# Patient Record
Sex: Male | Born: 1950 | Race: White | Hispanic: No | Marital: Married | Smoking: Former smoker
Health system: Southern US, Community
[De-identification: ages and names within clinical notes are randomized; demographics above are authoritative.]

## PROBLEM LIST (undated history)

## (undated) DIAGNOSIS — I340 Nonrheumatic mitral (valve) insufficiency: Secondary | ICD-10-CM

## (undated) DIAGNOSIS — I2510 Atherosclerotic heart disease of native coronary artery without angina pectoris: Secondary | ICD-10-CM

## (undated) DIAGNOSIS — I10 Essential (primary) hypertension: Secondary | ICD-10-CM

## (undated) DIAGNOSIS — E785 Hyperlipidemia, unspecified: Secondary | ICD-10-CM

## (undated) DIAGNOSIS — N529 Male erectile dysfunction, unspecified: Secondary | ICD-10-CM

## (undated) DIAGNOSIS — E119 Type 2 diabetes mellitus without complications: Secondary | ICD-10-CM

## (undated) DIAGNOSIS — G4730 Sleep apnea, unspecified: Secondary | ICD-10-CM

## (undated) HISTORY — PX: COLONOSCOPY: SHX174

## (undated) HISTORY — DX: Type 2 diabetes mellitus without complications: E11.9

## (undated) HISTORY — DX: Essential (primary) hypertension: I10

## (undated) HISTORY — DX: Hyperlipidemia, unspecified: E78.5

## (undated) HISTORY — DX: Atherosclerotic heart disease of native coronary artery without angina pectoris: I25.10

## (undated) HISTORY — DX: Male erectile dysfunction, unspecified: N52.9

## (undated) HISTORY — PX: WRIST SURGERY: SHX841

---

## 1998-09-05 ENCOUNTER — Emergency Department (HOSPITAL_COMMUNITY): Admission: EM | Admit: 1998-09-05 | Discharge: 1998-09-05 | Payer: Self-pay | Admitting: Emergency Medicine

## 1998-09-05 ENCOUNTER — Encounter: Payer: Self-pay | Admitting: Emergency Medicine

## 1998-09-07 ENCOUNTER — Encounter: Payer: Self-pay | Admitting: Orthopedic Surgery

## 1998-09-07 ENCOUNTER — Ambulatory Visit (HOSPITAL_COMMUNITY): Admission: RE | Admit: 1998-09-07 | Discharge: 1998-09-07 | Payer: Self-pay | Admitting: Orthopedic Surgery

## 1998-10-28 ENCOUNTER — Encounter: Payer: Self-pay | Admitting: Orthopedic Surgery

## 1998-10-28 ENCOUNTER — Observation Stay (HOSPITAL_COMMUNITY): Admission: RE | Admit: 1998-10-28 | Discharge: 1998-10-29 | Payer: Self-pay | Admitting: Orthopedic Surgery

## 1998-10-28 ENCOUNTER — Encounter (INDEPENDENT_AMBULATORY_CARE_PROVIDER_SITE_OTHER): Payer: Self-pay | Admitting: Specialist

## 2001-08-16 ENCOUNTER — Encounter: Admission: RE | Admit: 2001-08-16 | Discharge: 2001-08-16 | Payer: Self-pay | Admitting: Family Medicine

## 2001-08-16 ENCOUNTER — Encounter: Payer: Self-pay | Admitting: Family Medicine

## 2001-10-25 ENCOUNTER — Ambulatory Visit (HOSPITAL_COMMUNITY): Admission: RE | Admit: 2001-10-25 | Discharge: 2001-10-25 | Payer: Self-pay | Admitting: *Deleted

## 2002-03-24 ENCOUNTER — Encounter: Payer: Self-pay | Admitting: Internal Medicine

## 2002-03-24 ENCOUNTER — Encounter: Admission: RE | Admit: 2002-03-24 | Discharge: 2002-03-24 | Payer: Self-pay | Admitting: Internal Medicine

## 2003-11-13 ENCOUNTER — Ambulatory Visit: Payer: Self-pay | Admitting: Internal Medicine

## 2003-11-23 ENCOUNTER — Encounter: Admission: RE | Admit: 2003-11-23 | Discharge: 2003-11-23 | Payer: Self-pay | Admitting: Internal Medicine

## 2004-04-18 ENCOUNTER — Ambulatory Visit: Payer: Self-pay | Admitting: Internal Medicine

## 2005-03-13 ENCOUNTER — Ambulatory Visit: Payer: Self-pay | Admitting: Internal Medicine

## 2005-03-17 ENCOUNTER — Ambulatory Visit: Payer: Self-pay | Admitting: Internal Medicine

## 2006-01-03 ENCOUNTER — Ambulatory Visit: Payer: Self-pay | Admitting: Internal Medicine

## 2006-01-03 LAB — CONVERTED CEMR LAB
Folate: 6.1 ng/mL
Sed Rate: 17 mm/hr (ref 0–20)

## 2006-01-16 ENCOUNTER — Ambulatory Visit: Payer: Self-pay | Admitting: Internal Medicine

## 2006-02-28 ENCOUNTER — Ambulatory Visit: Payer: Self-pay | Admitting: Internal Medicine

## 2006-03-29 ENCOUNTER — Ambulatory Visit: Payer: Self-pay | Admitting: Internal Medicine

## 2006-05-09 DIAGNOSIS — F528 Other sexual dysfunction not due to a substance or known physiological condition: Secondary | ICD-10-CM

## 2006-05-09 DIAGNOSIS — E785 Hyperlipidemia, unspecified: Secondary | ICD-10-CM | POA: Insufficient documentation

## 2006-05-09 DIAGNOSIS — S62109A Fracture of unspecified carpal bone, unspecified wrist, initial encounter for closed fracture: Secondary | ICD-10-CM | POA: Insufficient documentation

## 2006-05-11 ENCOUNTER — Ambulatory Visit: Payer: Self-pay | Admitting: Internal Medicine

## 2006-09-06 ENCOUNTER — Encounter: Payer: Self-pay | Admitting: Emergency Medicine

## 2006-09-06 ENCOUNTER — Inpatient Hospital Stay (HOSPITAL_COMMUNITY): Admission: AD | Admit: 2006-09-06 | Discharge: 2006-09-08 | Payer: Self-pay | Admitting: Internal Medicine

## 2006-09-06 ENCOUNTER — Telehealth (INDEPENDENT_AMBULATORY_CARE_PROVIDER_SITE_OTHER): Payer: Self-pay | Admitting: *Deleted

## 2006-09-06 ENCOUNTER — Ambulatory Visit: Payer: Self-pay | Admitting: Internal Medicine

## 2006-09-07 ENCOUNTER — Encounter: Payer: Self-pay | Admitting: Internal Medicine

## 2006-09-27 ENCOUNTER — Ambulatory Visit: Payer: Self-pay | Admitting: Internal Medicine

## 2006-10-12 ENCOUNTER — Ambulatory Visit: Payer: Self-pay | Admitting: Cardiology

## 2006-11-08 ENCOUNTER — Ambulatory Visit: Payer: Self-pay | Admitting: Internal Medicine

## 2006-11-19 ENCOUNTER — Encounter: Payer: Self-pay | Admitting: Internal Medicine

## 2006-12-26 ENCOUNTER — Ambulatory Visit: Payer: Self-pay | Admitting: Cardiology

## 2007-01-07 ENCOUNTER — Ambulatory Visit: Payer: Self-pay | Admitting: Internal Medicine

## 2007-01-07 DIAGNOSIS — I10 Essential (primary) hypertension: Secondary | ICD-10-CM

## 2007-01-07 DIAGNOSIS — I2510 Atherosclerotic heart disease of native coronary artery without angina pectoris: Secondary | ICD-10-CM

## 2007-01-07 DIAGNOSIS — Z9861 Coronary angioplasty status: Secondary | ICD-10-CM

## 2007-01-14 ENCOUNTER — Telehealth (INDEPENDENT_AMBULATORY_CARE_PROVIDER_SITE_OTHER): Payer: Self-pay | Admitting: *Deleted

## 2007-02-14 ENCOUNTER — Ambulatory Visit: Payer: Self-pay | Admitting: Internal Medicine

## 2007-03-05 ENCOUNTER — Ambulatory Visit: Payer: Self-pay | Admitting: Internal Medicine

## 2007-03-08 LAB — CONVERTED CEMR LAB
ALT: 46 units/L (ref 0–53)
BUN: 10 mg/dL (ref 6–23)
Basophils Absolute: 0 10*3/uL (ref 0.0–0.1)
CO2: 28 meq/L (ref 19–32)
Calcium: 9.5 mg/dL (ref 8.4–10.5)
Eosinophils Relative: 2.1 % (ref 0.0–5.0)
Folate: 13.7 ng/mL
GFR calc Af Amer: 68 mL/min
GFR calc non Af Amer: 56 mL/min
HCT: 39.9 % (ref 39.0–52.0)
MCHC: 32.2 g/dL (ref 30.0–36.0)
Neutrophils Relative %: 59.9 % (ref 43.0–77.0)
PSA: 0.82 ng/mL (ref 0.10–4.00)
Platelets: 274 10*3/uL (ref 150–400)
RBC: 4.32 M/uL (ref 4.22–5.81)
RDW: 14.1 % (ref 11.5–14.6)
TSH: 1.02 microintl units/mL (ref 0.35–5.50)
VLDL: 12 mg/dL (ref 0–40)
WBC: 8.9 10*3/uL (ref 4.5–10.5)

## 2007-03-26 ENCOUNTER — Telehealth: Payer: Self-pay | Admitting: Internal Medicine

## 2007-05-07 ENCOUNTER — Ambulatory Visit: Payer: Self-pay | Admitting: Cardiology

## 2007-08-29 ENCOUNTER — Ambulatory Visit: Payer: Self-pay | Admitting: Cardiology

## 2007-08-29 ENCOUNTER — Ambulatory Visit: Payer: Self-pay | Admitting: Internal Medicine

## 2007-08-29 LAB — CONVERTED CEMR LAB
Basophils Absolute: 0.1 10*3/uL (ref 0.0–0.1)
CO2: 28 meq/L (ref 19–32)
Chloride: 106 meq/L (ref 96–112)
Eosinophils Absolute: 0.3 10*3/uL (ref 0.0–0.7)
GFR calc non Af Amer: 67 mL/min
Lymphocytes Relative: 32.1 % (ref 12.0–46.0)
MCHC: 35.2 g/dL (ref 30.0–36.0)
MCV: 92.2 fL (ref 78.0–100.0)
Neutrophils Relative %: 55.4 % (ref 43.0–77.0)
Platelets: 230 10*3/uL (ref 150–400)
Potassium: 4 meq/L (ref 3.5–5.1)
Prothrombin Time: 12 s (ref 10.9–13.3)
RBC: 4.33 M/uL (ref 4.22–5.81)
RDW: 13.5 % (ref 11.5–14.6)
Sodium: 139 meq/L (ref 135–145)
aPTT: 30.8 s — ABNORMAL HIGH (ref 21.7–29.8)

## 2007-09-03 ENCOUNTER — Ambulatory Visit: Payer: Self-pay | Admitting: Cardiology

## 2007-09-03 ENCOUNTER — Inpatient Hospital Stay (HOSPITAL_BASED_OUTPATIENT_CLINIC_OR_DEPARTMENT_OTHER): Admission: RE | Admit: 2007-09-03 | Discharge: 2007-09-03 | Payer: Self-pay | Admitting: Cardiovascular Disease

## 2007-09-18 ENCOUNTER — Ambulatory Visit: Payer: Self-pay

## 2007-09-18 ENCOUNTER — Encounter: Payer: Self-pay | Admitting: Internal Medicine

## 2007-09-18 ENCOUNTER — Ambulatory Visit: Payer: Self-pay | Admitting: Internal Medicine

## 2007-09-25 ENCOUNTER — Ambulatory Visit: Payer: Self-pay

## 2007-09-25 ENCOUNTER — Ambulatory Visit: Payer: Self-pay | Admitting: Cardiovascular Disease

## 2007-11-11 ENCOUNTER — Ambulatory Visit: Payer: Self-pay | Admitting: Internal Medicine

## 2008-03-04 ENCOUNTER — Ambulatory Visit: Payer: Self-pay | Admitting: Cardiology

## 2008-05-18 ENCOUNTER — Ambulatory Visit: Payer: Self-pay | Admitting: Internal Medicine

## 2008-06-29 ENCOUNTER — Ambulatory Visit: Payer: Self-pay

## 2008-06-29 ENCOUNTER — Encounter: Payer: Self-pay | Admitting: Internal Medicine

## 2008-07-06 ENCOUNTER — Telehealth: Payer: Self-pay | Admitting: Internal Medicine

## 2008-09-01 ENCOUNTER — Ambulatory Visit: Payer: Self-pay | Admitting: Cardiology

## 2008-09-01 ENCOUNTER — Encounter: Payer: Self-pay | Admitting: Internal Medicine

## 2008-09-23 ENCOUNTER — Telehealth (INDEPENDENT_AMBULATORY_CARE_PROVIDER_SITE_OTHER): Payer: Self-pay | Admitting: *Deleted

## 2008-09-24 ENCOUNTER — Encounter: Payer: Self-pay | Admitting: Internal Medicine

## 2008-09-25 ENCOUNTER — Encounter (INDEPENDENT_AMBULATORY_CARE_PROVIDER_SITE_OTHER): Payer: Self-pay | Admitting: *Deleted

## 2008-10-14 ENCOUNTER — Encounter (INDEPENDENT_AMBULATORY_CARE_PROVIDER_SITE_OTHER): Payer: Self-pay | Admitting: *Deleted

## 2008-11-25 ENCOUNTER — Ambulatory Visit: Payer: Self-pay | Admitting: Internal Medicine

## 2008-11-25 DIAGNOSIS — N400 Enlarged prostate without lower urinary tract symptoms: Secondary | ICD-10-CM

## 2008-11-25 DIAGNOSIS — R7309 Other abnormal glucose: Secondary | ICD-10-CM

## 2008-11-26 LAB — CONVERTED CEMR LAB: Hgb A1c MFr Bld: 6.1 % (ref 4.6–6.5)

## 2008-11-27 ENCOUNTER — Encounter (INDEPENDENT_AMBULATORY_CARE_PROVIDER_SITE_OTHER): Payer: Self-pay | Admitting: *Deleted

## 2008-12-11 ENCOUNTER — Encounter (INDEPENDENT_AMBULATORY_CARE_PROVIDER_SITE_OTHER): Payer: Self-pay | Admitting: *Deleted

## 2009-01-06 ENCOUNTER — Telehealth: Payer: Self-pay | Admitting: Internal Medicine

## 2009-02-11 ENCOUNTER — Encounter: Payer: Self-pay | Admitting: Internal Medicine

## 2009-02-12 ENCOUNTER — Ambulatory Visit: Payer: Self-pay | Admitting: Internal Medicine

## 2009-04-06 ENCOUNTER — Encounter (INDEPENDENT_AMBULATORY_CARE_PROVIDER_SITE_OTHER): Payer: Self-pay | Admitting: *Deleted

## 2009-04-19 ENCOUNTER — Telehealth (INDEPENDENT_AMBULATORY_CARE_PROVIDER_SITE_OTHER): Payer: Self-pay | Admitting: *Deleted

## 2009-06-14 ENCOUNTER — Ambulatory Visit: Payer: Self-pay | Admitting: Internal Medicine

## 2009-06-14 DIAGNOSIS — M549 Dorsalgia, unspecified: Secondary | ICD-10-CM | POA: Insufficient documentation

## 2009-06-14 LAB — CONVERTED CEMR LAB
Bilirubin Urine: NEGATIVE
Glucose, Urine, Semiquant: NEGATIVE
Protein, U semiquant: NEGATIVE
Specific Gravity, Urine: <1.005
WBC Urine, dipstick: NEGATIVE
pH: 5

## 2009-11-22 ENCOUNTER — Telehealth: Payer: Self-pay | Admitting: Internal Medicine

## 2010-01-09 HISTORY — PX: COLONOSCOPY: SHX174

## 2010-02-08 NOTE — Progress Notes (Signed)
Summary: pt needs refill  Phone Note Refill Request Message from:  Patient on [facility]  Refills Requested: Medication #1:  METOPROLOL SUCCINATE 100 MG  TB24 1 by mouth qd  Medication #2:  PLAVIX 75 MG  TABS qd Initial call taken by: Omer Jack,  April 19, 2009 9:58 AM  Follow-up for Phone Call        Rx faxed to pharmacy [facility] x3 refills on Plavix and Metoprolol Follow-up by: Oswald Hillock,  April 19, 2009 10:50 AM    Prescriptions: METOPROLOL SUCCINATE 100 MG  TB24 (METOPROLOL SUCCINATE) 1 by mouth qd  #90 x 3   Entered by:   Oswald Hillock   Authorized by:   Sherrill Raring, MD, [facility]   Signed by:   Oswald Hillock on 04/19/2009   Method used:   Faxed to ...       [facility] (mail-order)             ,          Ph: [phone]       Fax: [phone]   RxID:   [id number] PLAVIX 75 MG  TABS (CLOPIDOGREL BISULFATE) qd  #90 x 3   Entered by:   Oswald Hillock   Authorized by:   Sherrill Raring, MD, [facility]   Signed by:   Oswald Hillock on 04/19/2009   Method used:   Faxed to ...       [facility] (mail-order)             ,          Ph: [phone]       Fax: [phone]   RxID:   [id number]

## 2010-02-08 NOTE — Miscellaneous (Signed)
  Clinical Lists Changes  Observations: Added new observation of RS STUDY: TRACER- study completion 02/12/09 (04/06/2009 11:12)      Research Study Name: TRACER- study completion 02/12/09

## 2010-02-08 NOTE — Assessment & Plan Note (Signed)
Summary: ROV   Visit Type:  Follow-up Primary Provider:  Nolon Rod. Paz MD  CC:  no complaints.  History of Present Illness: Patient is a 60 year old with a history of CAD (s/p NSTEMI in Summer of 2008.  PTCA/Stent to RCA.  REpeat cath in August 2009:  LAD  20 to 30% prox; 40 to 50% mid; RCA with 20% instent restenosis.  Myoview after showed normal perfusion.  Echo was normal)  I last saw him in May of last year. Since seen, she has done well.  Denies chest pains, no shortness of breath.  Current Medications (verified): 1)  Plavix 75 Mg  Tabs (Clopidogrel Bisulfate) .... Qd 2)  Lipitor 40 Mg  Tabs (Atorvastatin Calcium) .... Qd 3)  Metoprolol Succinate 100 Mg  Tb24 (Metoprolol Succinate) .Marland Kitchen.. 1 By Mouth Qd 4)  Viagra 100 Mg  Tabs (Sildenafil Citrate) .... 1/2  To 1 By Mouth Once Daily As Needed 5)  Nitroglycerin 0.4 Mg/hr Pt24 (Nitroglycerin) .... 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  Plans to increase walking  Review of Systems       All systems reviewed.  Neg to above problem  Vital Signs:  Patient profile:   60 year old male Height:      71 inches Weight:      245 pounds BMI:     34.29 Pulse rate:   67 / minute BP sitting:   122 / 82  (left  arm) Cuff size:   large  Vitals Entered By: Burnett Kanaris, CNA (February 12, 2009 11:15 AM)  Physical Exam  Additional Exam:  HEENT:  Normocephalic, atraumatic. EOMI, PERRLA.  Neck: JVP is normal. No thyromegaly. No bruits.  Lungs: clear to auscultation. No rales no wheezes.  Heart: Regular rate and rhythm. Normal S1, S2. No S3.   No significant murmurs. PMI not displaced.  Abdomen:  Obese  Supple, nontender. Normal bowel sounds. No definite masses. No hepatomegaly.  Extremities:   Good distal pulses throughout. No lower extremity edema.  Musculoskeletal :moving all extremities.  Neuro:   alert and oriented x3.    EKG  Procedure date:  02/12/2009  Findings:      NSR.  63.  LVH  Impression & Recommendations:  Problem # 1:  CORONARY ARTERY DISEASE (ICD-414.00) APpears stable.  Since stent was put in at time of NSTEMI and with 20% restensosis on last cath would continue Plavix and ASA. His updated medication list for this problem includes:    Plavix 75 Mg Tabs (Clopidogrel bisulfate) ..... Qd    Metoprolol Succinate 100 Mg Tb24 (Metoprolol succinate) .Marland Kitchen... 1 by mouth qd    Nitroglycerin 0.4 Mg/hr Pt24 (Nitroglycerin) .Marland Kitchen... Apply 1 patch each morning and remove at bedtime    Aspir-low 81 Mg Tbec (Aspirin) .Marland Kitchen... 1 tablet every day  Problem # 2:  HYPERLIPIDEMIA (ICD-272.4) Lipids checked today. His updated medication list for this  problem includes:    Lipitor 40 Mg Tabs (Atorvastatin calcium) ..... Qd  Problem # 3:  HYPERTENSION (ICD-401.9) Good control. His updated medication list for this problem includes:    Metoprolol Succinate 100 Mg Tb24 (Metoprolol succinate) .Marland Kitchen... 1 by mouth qd    Aspir-low 81 Mg Tbec (Aspirin) .Marland Kitchen... 1 tablet every day  Problem # 4:  PHYSICAL EXAMINATION (ICD-V70.0) Counselled on wt and increasing exercise.  Patient Instructions: 1)  continue meds.  Increase exercise.  F/U 1 year unless develop problems.

## 2010-02-08 NOTE — Miscellaneous (Signed)
  Clinical Lists Changes  Observations: Added new observation of ECHOINTERP:  1. Left ventricle: The cavity size was normal. Wall thickness was        normal. Systolic function was normal. The estimated ejection        fraction was in the range of 60% to 65%.     2. Left atrium: The atrium was mildly dilated.        (06/29/2008 15:16)      Echocardiogram  Procedure date:  06/29/2008  Findings:       1. Left ventricle: The cavity size was normal. Wall thickness was        normal. Systolic function was normal. The estimated ejection        fraction was in the range of 60% to 65%.     2. Left atrium: The atrium was mildly dilated.

## 2010-02-08 NOTE — Progress Notes (Signed)
Summary: Viagra refill  Phone Note Refill Request Message from:  Fax from Pharmacy on November 22, 2009 12:04 PM  Refills Requested: Medication #1:  VIAGRA 100 MG  TABS 1/2  to 1 by mouth once daily as needed [facility] pharmacy   fax [phone]    NOTE--per fax-patient has requested a 90 day supply of medication (as appropriate) from the [employer] states 90 day supply with 4 refills  Initial call taken by: Jerolyn Shin,  November 22, 2009 12:07 PM  Follow-up for Phone Call        unclear how many tablets is  "90 day supply" per his insurance policy. Let's try #30 and 3 refills Follow-up by: Jose E. Paz MD,  November 22, 2009 5:09 PM    Prescriptions: VIAGRA 100 MG  TABS (SILDENAFIL CITRATE) 1/2  to 1 by mouth once daily as needed  #30 x 3   Entered by:   Army Fossa CMA   Authorized by:   Nolon Rod. Paz MD   Signed by:   Army Fossa CMA on 11/23/2009   Method used:   Faxed to ...       [facility] (mail-[facility])             , [place]         Ph: [phone]       Fax: [phone]   RxID:   [id number]

## 2010-02-08 NOTE — Assessment & Plan Note (Signed)
Summary: LOWER BACK PAIN.CBS   Vital Signs:  Patient profile:   60 year old male Height:      71 inches Weight:      247 pounds Temp:     99.1 degrees F oral Pulse rate:   76 / minute BP sitting:   138 / 68  (left arm)  Vitals Entered By: Jeremy Johann CMA (June 14, 2009 3:52 PM) CC: pain in lower back x10day Comments REVIEWED MED LIST, PATIENT AGREED DOSE AND INSTRUCTION CORRECT    History of Present Illness: developed mild lower back pain 10 days ago, symptoms slightly worse for the last 3 days Last night he change a flat tire This morning the pain was much worse, some radiation to the left buttock. Pain exacerbated by standing up, not exacerbated by twisting his torso He is only taking aspirin and Motrin with mild help although  the pain is better this afternoon   ROS Denies fevers No bladder or bowel incontinence No lower extremity paresthesias No rash in the buttocks No hematuria or dysuria as far as his CAD, he was last seen by cardiology 2-11, stable, no changes suggested hyperglycemia, found to have a slightly elevated hemoglobin A1c a few months ago  Allergies: No Known Drug Allergies  Past History:  Past Medical History: HYPERLIPIDEMIA  HYPERTENSION   CORONARY ARTERY DISEASE S/P  stent Summer 2008 ERECTILE DYSFUNCTION (ICD-302.72) hyperglycemia, hemoglobin A1c 6.1  (11- 2010)  Past Surgical History: Reviewed history from 11/25/2008 and no changes required. 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   Abdomen:  soft, non-tender, and no distention.   Msk:  not tender to palpation of the lower back Extremities:  no edema Neurologic:  lower extremities: strength symmetric, DTRs symmetric (slightly decreased at the  ankles) Gait normal Posture no antalgic   Impression & Recommendations:  Problem # 1:  BACK PAIN (ICD-724.5) acute back pain without radicular features Conservative treatment see instructions If no better consider physical therapy and/or Vicodin His updated medication list for this problem includes:    Aspir-low 81 Mg Tbec (Aspirin) .Marland Kitchen... 1 tablet every day    Flexeril 10 Mg Tabs (Cyclobenzaprine hcl) ..... One by mouth twice a day as needed for pain  Problem # 2:  HYPERGLYCEMIA, FASTING (ICD-790.29) patient aware of the dx Recommend to come back for a followup in few weeks  Complete Medication List: 1)  Plavix 75 Mg Tabs (Clopidogrel bisulfate) .... Qd 2)  Lipitor 40 Mg Tabs (Atorvastatin calcium) .... Qd 3)  Metoprolol Succinate 100 Mg Tb24 (Metoprolol succinate) .Marland Kitchen.. 1 by mouth qd 4)  Viagra 100 Mg Tabs (Sildenafil citrate) .... 1/2  to 1 by mouth once daily as needed 5)  Nitroglycerin 0.4 Mg/hr Pt24 (Nitroglycerin) .... Apply 1 patch each morning and remove at bedtime 6)  Aspir-low 81 Mg Tbec (Aspirin) .Marland Kitchen.. 1 tablet every day 7)  Flexeril 10 Mg Tabs (Cyclobenzaprine hcl) .... One by mouth twice a day as needed for pain  Other Orders: UA Dipstick w/o Micro (manual) (02725)  Patient Instructions: 1)  rest, no heavy lifting 2)  Heating pad to the back 3)  For pain use Tylenol 500 mg one or 2 tablets every 6 hours as needed 4)  Avoid taking more than  4000 mg of Tylenol in a 24 hour period( can cause liver damage in higher doses).  5)  Flexeril, muscle relaxant, as needed. Will make her drowsy 6)  Call me if not better  in few days Prescriptions: FLEXERIL 10 MG TABS (CYCLOBENZAPRINE HCL) one by mouth twice a day as needed for pain  #20 x 0   Entered and Authorized by:   Nolon Rod. Jakeria Caissie MD   Signed by:   Nolon Rod. Manika Hast MD on 06/14/2009   Method used:   Print then Give to Patient   RxID:   [id number]   Laboratory Results   Urine Tests   Date/Time Reported:  .d  Routine Urinalysis   Color: yellow Appearance: Clear Glucose: negative   (Normal Range: Negative) Bilirubin: negative   (Normal Range: Negative) Ketone: negative   (Normal Range: Negative) Spec. Gravity: <1.005   (Normal Range: 1.003-1.035) Blood: negative   (Normal Range: Negative) pH: 5.0   (Normal Range: 5.0-8.0) Protein: negative   (Normal Range: Negative) Urobilinogen: 0.2   (Normal Range: 0-1) Nitrite: negative   (Normal Range: Negative) Leukocyte Esterace: negative   (Normal Range: Negative)

## 2010-05-24 NOTE — Assessment & Plan Note (Signed)
[facility]                            CARDIOLOGY OFFICE NOTE   NAME:[name], [name]                      MRN:          [id number]  DATE:08/29/2007                            DOB:          Jun 21, 1950    IDENTIFICATION:  [name] [name] is a 60 year old gentleman.  I last saw [name] [name]  in October 2008.  [name] [name] is status post a non-Q-wave MI, underwent PTCA/bare  metal stent to [name] proximal RCA.  [name] [name] comes in today for continued care.   [name] [name] said [name] [name] is actually been feeling worse particularly over [name]  past month, but even before.  [name] [name] has had increased shortness of breath.  [name] [name] has also had occasional chest pains initially, very short-lived.  [name] [name]  has had some pressure that is sometimes occurring in his back as well.  This actually improved some with activity.  [name] [name] has noted some numbness  though in his left arm at times.  Overall, though [name] [name] does note a decline  in his ability to do things, [name] [name] just gives out easily.  [name] [name] is concerned  that [name] [name] may be having problems again as [name] [name] did a year ago.   CURRENT MEDICINES:  1. Tracer study drug.  2. Metoprolol 100 daily.  3. Plavix 75.  4. Lipitor 40.   PHYSICAL EXAMINATION:  GENERAL:  [name] [name] is in no distress.  VITAL SIGNS:  Blood pressure is 140/84, pulse is 56 and regular.  Weight  243.  LUNGS:  Clear.  CARDIAC:  Regular rate and rhythm.  S1 and S2.  No S3.  No significant  murmurs.  ABDOMEN:  Benign.  EXTREMITIES:  Good distal pulses.  No edema.   ALLERGIES:  None.   PAST MEDICAL HISTORY:  1. CAD.  2. Hypertension.  3. Dyslipidemia.  4. History of nonsustained V-TACH during hospitalization with MI.  5. Increased glucose, normal hemoglobin A1c in [name] past.   SOCIAL HISTORY:  [name] [name] has changed jobs to a less stressful  position.  Note, [name] [name] is busy.  His mother is in nursing home.  [name] [name] is  trying to arrange placement for this.  [name] [name] quit tobacco in 2006 after 25-  pack a year.  Drinks rare beer, has  occasional use.  Had a history of  cannabis use.   FAMILY HISTORY:  Mother has arthritis, in nursing home.  Father died at  age 86, died of cancer, did have a history of MI.  No other known  coronary artery disease.   REVIEW OF SYSTEMS:  All systems reviewed, negative to [name] above problem  except as noted above.   A 12-lead EKG:  Normal sinus rhythm, bradycardia at 55 beats per minute.   IMPRESSION:  [name] [name] is a 60 year old gentleman with a history of a  non-Q-wave myocardial infarction, who underwent percutaneous  transluminal coronary angioplasty/bare metal stent to [name] proximal right  coronary artery a little less than a year ago.  [name] [name] is now having  symptoms of increased dyspnea, some chest pressure though it is not  completely typical, more concerning now is his fatigability, shortness  of breath, which [name] [name] says is progressive.  [name] [name] used to be able to mow [name]  lawn very well after [name] stent was placed and now [name] [name] cannot do that.  I  am concerned this may represent renarrowing of [name] stent site or new  disease.  I would recommend cardiac catheterization.  [name] [name] understands and  agrees to this as well [name] [name] would like to know.  We will go ahead and plan  for this to be done next week at [facility].  Pre-lab testing will be  done today.   We will be in touch with [name] [name].  [name] [name] is to continue current regimen  for now.   Dyslipidemia.  Continue on Lipitor.  LDL on last check was 86, HDL of  35.  We will need to get fasting labs.  This is not optimal and [name] [name] was  to work on diet, need to repeat.     Pricilla Riffle, MD, [facility]  Electronically Signed    PVR/MedQ  DD: 08/29/2007  DT: 08/30/2007  Job #: [id number]

## 2010-05-24 NOTE — Cardiovascular Report (Signed)
NAME:  GRACESON, NICHELSON NO.:  [id number]   MEDICAL RECORD NO.:  [id number]          PATIENT TYPE:  INP   LOCATION:  6529                         FACILITY:  [facility]   PHYSICIAN:  Veverly Fells. Excell Seltzer, MD  DATE OF BIRTH:  1950/03/01   DATE OF PROCEDURE:  09/07/2006  DATE OF DISCHARGE:  09/08/2006                            CARDIAC CATHETERIZATION   PROCEDURES:  1. Percutaneous coronary intervention of the right coronary artery      with balloon angioplasty and stenting.  2. Angio-Seal of the right femoral artery.   INDICATIONS:  Mr. Finigan it is presented with a non-ST-elevation  myocardial infarction.  Diagnostic catheterization performed by Dr.  Antoine Poche demonstrated moderate LAD stenosis and severe focal right  coronary artery stenosis with an eccentric ulcerated plaque and 95%  stenosis.  We elected to intervene on the right coronary artery, which  was clearly the patient's culprit lesion.   Risks and indications of the procedure were reviewed with the patient.  A 6-French guide catheter was inserted.  Angiomax was used for  anticoagulation.  The patient was preloaded with 600 mg of clopidogrel.  He is enrolled in the TRACER study.  Once a therapeutic ACT was achieved  with Angiomax, a cougar guidewire was passed easily beyond the lesion.  The lesion was predilated with a 3-0 x 15-mm Maverick balloon to 10  atmospheres.  The balloon expanded well.  The lesion was then stented  with a 4.5 x 16 mm Liberte stent, which was deployed at 14 atmospheres.  The stent expanded well and there was TIMI-3 flow following stenting.  I  elected to post dilate with the 5.0 x 12-mm Quantum Maverick, which was  inflated to 16 atmospheres on two subsequent inflations.  Following  stenting there was an excellent angiographic result with a widely patent  stent and TIMI-3 throughout the vessel.  At the conclusion of the  procedure an Angio-Seal device was used to seal the femoral  arteriotomy.   ASSESSMENT:  Successful bare metal stenting of the mid-right coronary  artery.   RECOMMENDATIONS:  Recommend ongoing dual antiplatelet therapy with  aspirin and clopidogrel for a minimum of 30 days.  The patient should  continue aspirin lifelong.  If he is able to tolerate, he should  continue clopidogrel for up to 1 year based on the care data.      Veverly Fells. Excell Seltzer, MD  Electronically Signed     MDC/MEDQ  D:  09/07/2006  T:  09/08/2006  Job:  [id number]   cc:   Willow Ora, MD

## 2010-05-24 NOTE — Cardiovascular Report (Signed)
NAME:  UDELL, MAZZOCCO NO.:  [id number]   MEDICAL RECORD NO.:  [id number]          PATIENT TYPE:  INP   LOCATION:  6529                         FACILITY:  [facility]   PHYSICIAN:  Rollene Rotunda, MD, FACCDATE OF BIRTH:  [birth year]   DATE OF PROCEDURE:  09/07/2006  DATE OF DISCHARGE:  09/08/2006                            CARDIAC CATHETERIZATION   PRIMARY CARE PHYSICIAN:  Willow Ora, MD.   CARDIOLOGIST:  Veverly Fells. Excell Seltzer, MD.   PROCEDURE:  Left heart catheterization, coronary arteriography.   INDICATIONS:  A patient with chest pain and non-Q-wave myocardial  infarction.   PROCEDURE NOTE:  Left heart catheterization performed via the right  femoral artery.  The artery was cannulated using anterior wall puncture.  A #6-French arterial sheath was inserted via the modified Seldinger  technique.  Preformed Judkins and a pigtail catheter utilized.  The  patient tolerated the tolerated the procedure well.   RESULTS:  1. Hemodynamics LV 140/12, AO 140/76.  2. Coronaries -- the left main was normal.  3. The LAD had an a proximal 25% stenosis before first diagonal, and      40% stenosis after the diagonal.  There were apical tandem 25%      lesions.  There was a large diagonal with ostial 25% stenosis and      proximal tandem 25% lesions.  4. The circumflex was a very small vessel and was free of any high-      grade disease.  5. The right coronary artery was a super dominant vessel.  There was a      proximal 90% stenosis with haziness.  There was mid long 25%      stenosis.  There were diffuse luminal irregularities.  PDA was      large and normal.  There were moderate sized posterolaterals which      were normal.   LEFT VENTRICULOGRAM:  A left ventricular was obtained in the RAO  projection.  The EF was 55% with inferior mild akinesis.   CONCLUSION:  Severe single-vessel coronary artery disease.   PLAN:  The patient will have percutaneous revascularization of the  right  coronary artery and aggressive secondary risk reduction.     Rollene Rotunda, MD, [facility]  Electronically Signed    JH/MEDQ  D:  09/07/2006  T:  09/08/2006  Job:  [id number]   cc:   Willow Ora, MD

## 2010-05-24 NOTE — Procedures (Signed)
[facility]                              EXERCISE TREADMILL   NAME:[name], [name]                      MRN:          [id number]  DATE:09/25/2007                            DOB:          02/28/1950    PRIMARY CARDIOLOGIST:  Dr. Tenny Craw   Mr. [name] [name] is a 60 year old white male who has recently had a stress  Myoview scan and which he had multiple episodes of nonsustained V-tach  up to 14 beats at peak exercise.  This was reviewed by Dr. Graciela Husbands who  recommended a repeat stress test on his beta-blocker.  It should be  noted that his perfusion scan showed no ischemia.   The patient takes 100 mg of metoprolol.  He was able to exercise 9  minutes of the Bruce protocol.  Stopped due to fatigue.  No chest pain  or palpitations.  He had 2 couplets during exercise, but no other  arrhythmias or EKG changes.  His blood pressure did get up to 218/83,  but came down nicely in recovery to 133/77.  The patient had no  nonsustained V-tach on a stress test while taking the beta-blocker.  We  will follow up with Dr. Tenny Craw in 1 month.      Jacolyn Reedy, PA-C  Electronically Signed      Noralyn Pick. Eden Emms, MD, [facility]  Electronically Signed   ML/MedQ  DD: 09/25/2007  DT: 09/26/2007  Job #: [id number]

## 2010-05-24 NOTE — Assessment & Plan Note (Signed)
[facility]                            CARDIOLOGY OFFICE NOTE   NAME:[name] [name], [name] [name]                      MRN:          [id number]  DATE:09/18/2007                            DOB:          [birth year]    PRIMARY CARE PHYSICIAN:  Willow Ora, MD   PRIMARY CARDIOLOGIST:  Pricilla Riffle, MD, [facility]   This is a very pleasant 60 year old white male patient of Dr. Tenny Craw who  recently underwent cardiac catheterization on September 03, 2007, because  of some back pain going down to his left arm.  Cath showed 40-50%  proximal LAD at the first diagonal.  It did not appear significantly  different from prior films.  He had a mid RCA stent that is patent with  minimal in-stent restenosis.  There was no definite source for the  patient's chest pain and shortness of breath and his LV end-diastolic  pressure is not significantly elevated.  LV function was mildly  decreased.  EF 50% with very mild global hypokinesis.  The patient had a  prior non-Q-wave MI treated with bare-metal stent to the proximal RCA in  August 2008.   Stress Myoview was ordered to look for any LAD territory for ischemia  and this was performed today prior to my seeing him.  The patient having  his metoprolol before coming.  He exercised to stage II at the [facility]  protocol.  He had a hypertensive response.  Blood pressure went up to  218/93 and he developed nonsustained V-tach.  Images came back normal  except for a EF was slightly lower than cath EF.  EF was 46% on Myoview.  Dr. Graciela Husbands reviewed his stress test in detail and felt like he does have  nonsustained V-tach and recommended putting him back on his metoprolol  at 100 mg daily and bringing him back next week for a stress test.  The  patient was pretty asymptomatic with this.  He did get a little bit  short of breath, but overall did not have any chest pain, dizziness,  palpitations, or presyncope.   The patient does not exercise on a regular  basis.  He says most of his  pain that is in his back, radiating down to his arm, as when he is  sitting, watching TV, and he can reproduce it by leaning forward.  He  has had a pinched nerve in his neck and now he thinks that this may be  causing these symptoms.  He has not had any chest tightness like he had  when he had his MI in a year ago.  He denies any palpitations,  dizziness, or presyncope.   CURRENT MEDICATIONS:  1. TRACER study drug.  2. Metoprolol 100 mg daily.  3. Plavix 75 mg daily.  4. Lipitor 40 mg daily.   PHYSICAL EXAMINATION:  GENERAL:  This is a very pleasant 60 year old  white male in no acute distress.  VITAL SIGNS:  Blood pressure is 130/80, pulse 80 at end of exercise.  NECK:  Without JVD, HJR, bruit, or thyroid enlargement.  LUNGS:  Clear in anterior,  posterior, and lateral.  HEART:  Regular rate and rhythm at 70 beats per minute.  Normal S1 and  S2.  No murmur, rub, bruit, thrill or heave noted.  ABDOMEN:  Soft  without organomegaly, masses, lesions or abnormal tenderness.  EXTREMITIES:  Without cyanosis, clubbing, or edema.  EXTREMITIES:  He has good distal pulses.   IMPRESSION:  1. Nonsustained ventricular tachycardia on stress Myoview, today while      off beta-blocker.  2. Coronary artery disease, status post non-Q-wave myocardial      infarction in August 2008, treated with bare-metal stent to the      right coronary artery.  3. Cardiac catheterization on September 03, 2007 revealed a 40-50%      proximal left anterior descending unchanged from prior cath, patent      mid right coronary artery stent with minimal in-stent restenosis, a      mild decreased left ventricular function, ejection fraction 50%      with mild global hypokinesis, and ejection fraction 46% on stress      Myoview.  4. History of nonsustained ventricular tachycardia in August 2008      after myocardial infarction.  5. Hypertension.  6. Dyslipidemia.   PLAN:  We will have the  patient restart his metoprolol today.  Dr. Graciela Husbands  wants a stress test done in our office next week on metoprolol. If he  continues to have nonsustained V-tach, we would consider treating him  with sotalol.  I explained all this to the patient and he understands  and is agreeable.      Jacolyn Reedy, PA-C  Electronically Signed      Duke Salvia, MD, [facility]  Electronically Signed   ML/MedQ  DD: 09/18/2007  DT: 09/19/2007  Job #: [id number]

## 2010-05-24 NOTE — Discharge Summary (Signed)
NAME:  [name] [name], [name] [name] NO.:  [id number]   MEDICAL RECORD NO.:  [id number]          PATIENT TYPE:  INP   LOCATION:  6529                         FACILITY:  [facility]   PHYSICIAN:  Learta Codding, MD,FACC DATE OF BIRTH:  24-May-1950   DATE OF ADMISSION:  09/06/2006  DATE OF DISCHARGE:  09/08/2006                               DISCHARGE SUMMARY   PRIMARY CARE PHYSICIAN:  Dr. Willow Ora.   CARDIOLOGIST:  Dr. Dietrich Pates.   REASON FOR ADMISSION:  Chest pain.   DISCHARGE DIAGNOSES:  1. Coronary disease.      a.     Status post non-ST-elevation myocardial infarction treated       with a bare metal stent to the right coronary artery this       admission.      b.     Residual nonobstructive coronary disease in the left       anterior descending and circumflex.      c.     TRACER study.  2. Preserved left ventricular function with an ejection fraction of      55%.  3. Dyslipidemia.  4. Nonsustained ventricular tachycardia.  5. Newly diagnosed hypertension.  6. Hyperglycemia with normal hemoglobin A1c.   PROCEDURE PERFORMED THIS ADMISSION:  1. Cardiac catheterization by Dr. Rollene Rotunda, September 07, 2006:      LAD proximal 25% before D1 and 40% after D1, apical 25%, diagonal      ostial 25%, proximal tandem 25/25%, proximal RCA 90%, mid 25%; LVEF      55% with mild inferior akinesis.  2. Percutaneous coronary intervention by Dr. Tonny Bollman, September 07, 2006, with placement of a Liberte 4.5 x 16 mm bare metal stent      to the proximal RCA.   HISTORY:  [name] [name] is a 60 year old male patient with essentially no  past medical history who presented to the emergency room with a three  day history of anterior left chest tightness.  He noted pain with  exertion as well as upper extremity movement.  It was usually relieved  by rest.  The night prior to admission he could not be comfortable.  He  eventually presented to the emergency room.  His initial troponin was  0.08.  He was admitted for further evaluation and treatment.   HOSPITAL COURSE:  It was felt that the patient were concerning for  unstable angina pectoris.  His enzymes did return positive for non-ST-  elevation myocardial infarction.  He went for cardiac catheterization on  September 07, 2006.  Results as noted above.  It was decided to proceed  with percutaneous coronary intervention to the proximal RCA.  This was  performed by Dr. Excell Seltzer on September 07, 2006, with a bare metal stent.  The patient tolerated the procedure well and had no immediate  complications.  On the morning of September 08, 2006, he was noted to be in  stable condition.  He denied chest pain or shortness of breath.  He had  two episodes of nonsustained V-tach on his monitor.  Otherwise he  remained in sinus rhythm.  His CK-MB and troponins were trending down.  His right femoral arteriotomy site was with some dried blood on the  dressing, soft, without bruits.  The patient was felt to be stable  enough for discharge to home.   LABORATORY AND ANCILLARY DATA:  White count 9900, hemoglobin 13.6,  platelet count 270,000.  INR 1.  Sodium 136, potassium 4.2, glucose 102,  BUN 9, creatinine 1.24.  Total bilirubin 1.4, alkaline phosphatase 69,  AST 40, ALT 30, total protein 6.7, albumin 3.9, calcium 8.7.  Hemoglobin  A1c 5.7.  Peak CK-MB 10.4, peak troponin-I 0.72.  Total cholesterol 216,  triglycerides 109, HDL 39, LDL 155.  TSH 1.561.   Chest x-ray on admission, no acute findings.   DISCHARGE MEDICATIONS:  1. Coated aspirin 325 mg daily.  2. Plavix 75 mg daily.  3. Lipitor 40 mg nightly.  4. Toprol XL 100 mg.  5. TRACER study drug.  6. Nitroglycerin p.r.n. chest pain.   DIET:  Low-fat, low-sodium diet.   ACTIVITIES:  He is to increase activity slowly.  He may walk up steps.  He may shower.  No lifting for two weeks.  No sexual activity for two  weeks.  No driving for three days.  He may return to work on September 17, 2006,   WOUND CARE:  He should call our office for any groin swelling, bleeding,  bruising or fever.   FOLLOW-UP:  1. The office will contact him for a follow-up appointment with Dr.      Tenny Craw in the next two weeks.  If she is available, the patient will      be set up with Dr. Excell Seltzer.  2. He should follow up with Dr. Drue Novel as directed.  His glucose will      need to be followed and this can be done with Dr. Drue Novel.   TOTAL PHYSICIAN/PA TIME:  Greater than 30 minutes on this discharge.      Tereso Newcomer, PA-C      Learta Codding, MD,FACC  Electronically Signed    SW/MEDQ  D:  09/08/2006  T:  09/09/2006  Job:  [id number]   cc:   Willow Ora, MD

## 2010-05-24 NOTE — Assessment & Plan Note (Signed)
[facility]                            CARDIOLOGY OFFICE NOTE   NAME:[name] [name], [name] [name]                      MRN:          [id number]  DATE:09/27/2006                            DOB:          1950-09-16    IDENTIFICATION:  Mr. Jeschke is a 60 year old gentleman who I saw in the  hospital, admitted with chest pain.  He was just discharged on August  30.  The patient is status post non-ST segment elevation myocardial  infarction.  He underwent cardiac catheterization that showed a 25%  proximal left anterior descending, 40% mid left anterior descending, 25%  distal left anterior descending, 25% diagonal.  Circumflex was a small  vessel, right coronary artery was super dominant with a proximal 90%  stenosis with haziness.  There was a long 25% stenosis in the mid  region.   The patient underwent bare-metal stenting on August 29 with a Liberte  bare-metal stent to the proximal right coronary artery.  He was  discharged home on Plavix times at least 1 month, optimally a year, and  on the Tracer study drug.   Since discharge, he has done okay.  He denies dizziness, no chest pain.  He is slowly increasing his activity.   CURRENT MEDICATIONS:  1. Tracer study drug.  2. Metoprolol ER 100.  3. Plavix 75.  4. Lipitor 40.   PHYSICAL EXAMINATION:  On exam, the patient is in no distress.  Blood pressure is 161/87, pulse is 45 and regular.  Weight 241.  LUNGS:  Clear.  NECK:  JVP is normal.  CARDIAC EXAM:  Regular rate and rhythm, S1, S2, no S3, no murmurs.  ABDOMEN:  Benign.  EXTREMITIES:  Right groin without hematoma or bruit.  No lower extremity  edema.   IMPRESSION:  1. Coronary artery disease, status post bare-metal stenting, doing      well on regimen.  Instructed him on their purposes and why he      should stay on all of them.  2. Hypertension.  Blood pressure is increased today, will need to      follow.  I would not pull down for now.  Will have  the patient      check at home.  He was a bit anxious today.  3. Dyslipidemia.  Will have fasting lipid panel in 8 weeks.  4. The patient is going back to work.  I have encouraged him to take      things as tolerated, although he is a fairly intense individual.      He will not be able to attend cardiac rehab.  I will see if I can      come up with a regimen for him to do on his own which he is eager      to do.  5. I will set followup for 4-6 weeks' time, sooner if problems      develop.   ADDENDUM:  A 12-lead EKG, sinus bradycardia, 45 beats per minute.     Pricilla Riffle, MD, [facility]  Electronically Signed    PVR/MedQ  DD:  09/27/2006  DT: 09/28/2006  Job #: [id number]

## 2010-05-24 NOTE — Cardiovascular Report (Signed)
NAME:  [name] [name], [name] [name] NO.:  [id number]   MEDICAL RECORD NO.:  [id number]          PATIENT TYPE:  OIB   LOCATION:  1963                         FACILITY:  [facility]   PHYSICIAN:  Marca Ancona, MD      DATE OF BIRTH:  Feb 03, 1950   DATE OF PROCEDURE:  09/03/2007  DATE OF DISCHARGE:  09/03/2007                            CARDIAC CATHETERIZATION   Indication:  Chest pain, history of CAD.   PROCEDURES:  1. Left heart catheterization.  2. Coronary angiography.  3. Left ventriculography.   PROCEDURE NOTE:  After informed consent was obtained, the right groin  was sterilely prepped and draped.  Lidocaine 1% was used to locally  anesthetize the right groin.  The right common femoral artery was  entered using Seldinger technique and a 4-French arterial sheath was  placed.  The left coronary artery was engaged with the 4-French JL4  catheter.  The right coronary artery was engaged with the 4-French 3D RC  catheter, and the ventricle was entered using the 4-French angled  pigtail catheter.  There were no complications.   FINDINGS:  1. Left ventriculography.  LVEF was overall mildly decreased and      estimated to be 50%.  There was very mild global hypokinesis.  2. Pressures; left ventricle 120/12/15 and aorta 124/67.  3. Coronary angiography.  The coronary artery system was right      dominant.  The mid RCA stent was patent with minimal 20% in-stent      restenosis.  The left main was free of significant disease.  The      left circumflex is small vessel that was free of significant      disease.  The LAD is a large vessel.  There is approximately 20% to      30% proximal LAD stenosis just before the first diagonal.  There is      40% to 50% LAD stenosis just after the first diagonal and there is      mild 20% to 30% stenosis in the proximal first diagonal.   ASSESSMENT AND PLAN:  Today, his films were compared to 2008.  There is  stable coronary artery disease in the  left anterior descending artery.  There is approximately a 40% to 50% proximal left anterior descending  stenosis at the first diagonal.  This does not appear significantly  different compared to the prior films.  The mid right coronary artery  stent is patent with minimal in-stent restenosis.  There is no definite  source for the patient's chest pain and shortness of breath.  His left  ventricular end-diastolic pressure is not significantly elevated and  measures 15 mmHg today.  I have spoken with Dr. Tenny Craw, and we will plan  on obtaining a Myoview to rule out ischemia in the left anterior  descending territory.      Marca Ancona, MD  Electronically Signed    DM/MEDQ  D:  09/03/2007  T:  09/04/2007  Job:  [id number]

## 2010-05-24 NOTE — H&P (Signed)
NAME:  [name] [name], [name] [name] NO.:  [id number]   MEDICAL RECORD NO.:  [id number]          PATIENT TYPE:  EMS   LOCATION:  ED                           [facility]ACILITY:  [facility]   PHYSICIAN:  Pricilla Riffle, MD, FACCDATE OF BIRTH:  10-15-1950   DATE OF ADMISSION:  09/06/2006  DATE OF DISCHARGE:                              HISTORY & PHYSICAL   IDENTIFICATION:  Patient is a 60 year old gentleman who presents to the  emergency room with chest pain.   HISTORY OF PRESENT ILLNESS:  The patient has no known history of  coronary artery disease.  Over the past 3 days he has had episodes of  chest discomfort, upper left anterior cramping going bilateral arms back  to shoulders, some diaphoresis with this.  No nausea or vomiting.  Occurs mainly with exertion and relieved with rest.  For example, the  patient got up from bed this morning, went for a cup of coffee and  climbing the stairs to go back up to get ready for work he had some  chest discomfort, eased with rest.  When he was on his way to work he  parked the car.  Getting out of the car and walking into work he had  some discomfort that was relieved with rest.  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  He attributed it initially to pulled  muscles but he denied injuries.  Note, he had recent cold symptoms,  sinus congestion, runny nose but denies wheezes, no history of asthma.   Last night just could not get comfortable and chest pressure prior to  bed 7/10 at its worst and the patient notes with nitroglycerin in ER he  felt like that clouds lifted.   ALLERGIES:  None.   MEDICATIONS PRIOR TO ADMISSION:  Alka Seltzer p.r.n.   PAST MEDICAL HISTORY:  Negative.   SOCIAL HISTORY:  1. The patient lives in [place] with his wife.  2. He has 2 children.  3. He quit smoking in 2006 after a 25 pack year.  4. Drinks beer per month.  5. Smoked pot  last night.  6. Positive for cannabis.   FAMILY HISTORY:  1. Mother is alive at age 16, has arthritis.  2. Father died at age 48, had an MI, several actually, died of cancer.  3. Three brothers alive.  4. One sister alive.  5. No known heart disease.   REVIEW OF SYSTEMS:  Patient notes a 30 pound weight gain since 2006  after quitting smoking.  Chills as noted.  HEENT:  Wears glasses to  read.  Upper plate dentures.  Nose bleed last week.  CARDIAC:  As noted.  PULMONARY:  No history of asthma.  Patient notes snoring and wife notes  apnea type symptoms.  GI:  No reflux.  GU:  Nocturia, chronic.  NEURO:  Negative.  Otherwise, all systems reviewed, negative, see above problem.   PHYSICAL EXAM:  On exam, the patient is currently in no acute distress.  He  currently denies chest pressure.  Blood pressure initially 177/104,  now 152/89, pulse is 80's and regular, temperature is 99.5.  The patient  currently denies chest pressure.  HEENT:  Normocephalic, atraumatic, EOMI, PERRL.  Mild upper plate  dentures.  Otherwise, mucous membranes moist.  NECK:  JVP is normal, no thyromegaly, no bruits, no lymphadenopathy.  LUNGS:  Clear without rales or wheezes.  CARDIAC EXAM:  Regular rate and rhythm, S1 S2, no S3, S4 or murmurs.  CHEST:  Nontender.  ABDOMEN:  Supple, nontender, no hepatomegaly, normal bowel sounds, no  masses.  EXTREMITIES:  Dorsalis pedis pulses 2+, no lower extremity edema.  Feet  warm.  NEURO EXAM:  Alert and oriented x3.  Cranial nerves II-XII grossly  intact.  Moving all extremities.   Chest x-ray shows no acute disease.  EKG sinus rhythm 63 bpm.   LABS:  Significant for a hemoglobin of 13.8, WBC of 6.9, BUN and  creatinine of 8 and 1.2, potassium of 3.9, glucose of 132, CK MB of 3.6,  troponin 0.08 (point of care).   IMPRESSION:  1. The patient is a 60 year old with history of tobacco use, family      history of coronary artery disease.  Presents with three day       intermittent chest pressure mainly with exertion, walking, climbing      stairs, relieved with rest.  Had recent upper respiratory infection      but no wheezing or cough.  The pain is worrisome for unstable      angina.  Would recommend:  A.  Admit.  B.  CK MB, troponin.  Note initial troponin 0.08.  C.  Heparin.  D.  Aspirin.  E.  Lopressor.  F.  Check lipids.  G.  Check TSH.  I have discussed the options and risks with the patient regarding  further investigation (namely, Myoview versus catheterization).  The  patient understands, agrees with left heart cath in a.m.  Will go ahead  and plan.  1. Hypertension.  Will follow.  2. Healthcare maintenance.  Will follow glucoses and check hemoglobin      A1c, check a lipid panel in morning, check TSH.      Pricilla Riffle, MD, [facility]  Electronically Signed     PVR/MEDQ  D:  09/06/2006  T:  09/07/2006  Job:  [id number]   cc:   Willow Ora, MD  [phone] [place], [place] [address]

## 2010-05-24 NOTE — Assessment & Plan Note (Signed)
[facility]                            CARDIOLOGY OFFICE NOTE   NAME:[name] [name], [name] [name]                      MRN:          [id number]  DATE:11/11/2007                            DOB:          July 17, 1950    IDENTIFICATION:  [name] is a 60 year old gentleman.  History of CAD.  He was last seen in Cardiology Clinic back on the September.  In August,  he underwent repeat catheterization that showed a right dominant system,  RCA had a patent stent in the mid region with 20% in-stent restenosis.  Left main was normal.  Circumflex was small.  LAD was a large 20-30%,  proximal 40-50% mid.  The patient underwent Myoview stress testing that  showed normal perfusion on September 18, 2007, note of medicines.  He had  small brisk nonsustained VT. With this, he went on to have a regular  stress test.  He was back on his medicines, exercised 9 minutes, stopped  because fatigue.  There was no nonsustained VT note.  He did get a blood  pressure of 218/83, but recovered quickly.   Since seen, he has been doing okay.  Denies any significant chest  pressure.   CURRENT MEDICINES:  Include  1. Tracer study drug.  2. Metoprolol 100.  3. Plavix 75.  4. Lipitor 40   PHYSICAL EXAMINATION:  GENERAL:  The patient is in no distress.  Blood  pressure 150/84 (the patient notes it is 117/70 range at home), pulse is  63, weight 247 up from 243 on last check.  NECK:  No bruits.  LUNGS:  Clear.  CARDIAC:  Regular rate and rhythm, S1 and S2.  No S3, no murmurs.  ABDOMEN:  Benign.  EXTREMITIES:  No edema.   IMPRESSION:  1. Coronary artery disease, asymptomatic now.  No back pain or arm      pain.  All tests looked good.  We would continue on medical      therapy.  2. Hypertension.  [place] again need to be followed and would not go      up on his medicines now with what he tells me his pressures are at      home.  3. Dyslipidemia.  Continue on Lipitor.  Again, we will need  followup      in a few months.  4. Weight, discussed with him.  He is eating quite a bit of sugar.  He      drinks coffee with sugar, many cups a day.  Told him he try to      track this and cut back this may help him.  Increasing his activity      would also help.  I am not putting a limitations on him.  He would      like to make some changes on his own.  I instructed to consider      dietary, but he would like to try on his own.  We also talked about      zone diet as the possibility.  (The patient eats very little,  drinks      coffee for breakfast, has a sandwich for lunch, continues to drink      coffee and tea throughout the day all with sugar and at dinner he      has smaller serving sizes cutting his portion back).     Pricilla Riffle, MD, [facility]  Electronically Signed    PVR/MedQ  DD: 11/11/2007  DT: 11/12/2007  Job #: [id number]

## 2010-05-24 NOTE — Assessment & Plan Note (Signed)
[facility]                            CARDIOLOGY OFFICE NOTE   NAME:[name] [name], [name] [name]                        MRN:          [id number]  DATE:09/28/2006                            DOB:          [birth year]    IDENTIFICATION:  [name] is a gentleman who was just discharged from  the hospital status post MI with stent placement.  I spoke to him on the  phone today.  I have discussed his case with cardiac rehab.  They will  work at sending him a Neurosurgeon for some exercise guidelines, though not a  full prescription.  I informed him that his heart rate should be in the  70% maximal range for a while, and gradually build from there.   When I saw the patient in clinic yesterday, his blood pressure was up  some.  I asked him to take it intermittently.  If it is running in the  150s to 160s before I see him, he should call, we may need to adjust his  medicines sooner.     Pricilla Riffle, MD, [facility]  Electronically Signed    PVR/MedQ  DD: 09/28/2006  DT: 09/28/2006  Job #: [id number]

## 2010-05-24 NOTE — Assessment & Plan Note (Signed)
[facility]                            CARDIOLOGY OFFICE NOTE   NAME:Silfies, JADIE COMAS                      MRN:          [id number]  DATE:11/08/2006                            DOB:          March 04, 1950    IDENTIFICATION:  Mr. Letourneau is a 60 year old gentleman who I last saw  back in September. He has a history of a non-Q-wave MI and underwent  cardiac catheterization with PTCA stent (bare-metal stent) to the  proximal RCA.   When I saw him last, his blood pressure was on the high side, and I  wanted to follow up to make sure this was not a trend. He was a bit  anxious on the last exam.   In the interval, he has done fine. He is feeling well. His energy is  good. He takes his blood pressure at home and is in the 110 range.  Denies dizziness. No shortness of breath.   CURRENT MEDICATIONS:  1. Tracer study drug.  2. Metoprolol 100.  3. Plavix 75.  4. Lipitor 40.   PHYSICAL EXAMINATION:  On exam, the patient is in no distress. Blood  pressures are 116/72, pulse 70 and regular, weight 242 stable.  NECK:  JVP is normal. No bruits.  LUNGS:  Are clear. No rales.  CARDIAC EXAM:  Regular rate and rhythm. S1/S2. No S3. No murmurs.  ABDOMEN:  Benign.  EXTREMITIES:  No edema.   IMPRESSION:  1. Coronary artery disease. Again as noted above. Would continue on      current regimen.  2. Hypertension. Better control. Would continue. Denies dizziness.      Again, I have told him symptoms to watch for in case his blood      pressure goes a little too low.  3. Dyslipidemia. Will put in for a fasting lipid panel. He gets his      blood work done at [place] normally to see how he is doing on his      Lipitor.   Otherwise, I will set followup for four months, sooner if problems  develop.     Pricilla Riffle, MD, [facility]  Electronically Signed    PVR/MedQ  DD: 11/08/2006  DT: 11/09/2006  Job #: [id number]   cc:   Gerarda Fraction

## 2010-06-17 ENCOUNTER — Other Ambulatory Visit: Payer: Self-pay | Admitting: Internal Medicine

## 2010-06-17 NOTE — Telephone Encounter (Signed)
See phone note

## 2010-08-19 ENCOUNTER — Other Ambulatory Visit: Payer: Self-pay | Admitting: Internal Medicine

## 2010-08-19 ENCOUNTER — Encounter: Payer: Self-pay | Admitting: Internal Medicine

## 2010-08-22 ENCOUNTER — Ambulatory Visit (INDEPENDENT_AMBULATORY_CARE_PROVIDER_SITE_OTHER): Payer: 59 | Admitting: Internal Medicine

## 2010-08-22 ENCOUNTER — Encounter: Payer: Self-pay | Admitting: Internal Medicine

## 2010-08-22 VITALS — BP 122/73 | HR 73 | Ht 72.0 in | Wt 239.0 lb

## 2010-08-22 DIAGNOSIS — I2510 Atherosclerotic heart disease of native coronary artery without angina pectoris: Secondary | ICD-10-CM

## 2010-08-22 DIAGNOSIS — E785 Hyperlipidemia, unspecified: Secondary | ICD-10-CM

## 2010-08-22 DIAGNOSIS — I10 Essential (primary) hypertension: Secondary | ICD-10-CM

## 2010-08-22 LAB — LIPID PANEL
LDL Cholesterol: 80 mg/dL (ref 0–99)
Total CHOL/HDL Ratio: 4

## 2010-08-22 NOTE — Assessment & Plan Note (Signed)
Will check lipids today.  Encouraged wt loss

## 2010-08-22 NOTE — Progress Notes (Signed)
HPIPatient is a 60 year old with a history of CAD (s/p NSTEMI in Summer of 2008. PTCA/Bare metal Stent to RCA (proxmal, supradominant). REpeat cath in August 2009: LAD 20 to 30% prox; 40 to 50% mid; RCA with 20% instent restenosis. Myoview after showed normal perfusion. Echo was normal) I last saw him in May 2010 was normal I saw him in Feb 2011.  Last LDL around that time was 67. Since seen breathing ok, no chest pain.  Walking i 45 min q am.  Uses machine for about an hour Wants to lose wt. Ult goal is 210.  Not on File  Current Outpatient Prescriptions  Medication Sig Dispense Refill  . aspirin 81 MG tablet Take 81 mg by mouth daily.        Marland Kitchen atorvastatin (LIPITOR) 40 MG tablet Take 40 mg by mouth daily.        . clopidogrel (PLAVIX) 75 MG tablet TAKE 1 TABLET DAILY  90 tablet  1  . metoprolol (TOPROL-XL) 100 MG 24 hr tablet TAKE 1 TABLET DAILY  90 tablet  1  . nitroGLYCERIN (NITROSTAT) 0.4 MG SL tablet Place 0.4 mg under the tongue every 5 (five) minutes as needed.          Past Medical History  Diagnosis Date  . Hyperlipidemia   . Hypertension   . Coronary artery disease     S/P stent Summer 2008  . Erectile dysfunction   . Hyperglycemia     A1c 6.1 (11-2008)    Past Surgical History  Procedure Date  . Wrist surgery     fractured wrist, Dr. Amanda Pea  . Ptca     stent  . Colonoscopy     negative    Family History  Problem Relation Age of Onset  . Pernicious anemia Maternal Grandmother   . Coronary artery disease Father   . Colon cancer Father     ?  . Stroke Mother   . Coronary artery disease Brother     History   Social History  . Marital Status: Married    Spouse Name: N/A    Number of Children: 2  . Years of Education: N/A   Occupational History  . supervisor Time [employer]   Social History Main Topics  . Smoking status: Former Smoker    Quit date: 01/10/2003  . Smokeless tobacco: Not on file  . Alcohol Use: Yes     rarely  . Drug Use: Not on  file  . Sexually Active: Not on file   Other Topics Concern  . Not on file   Social History Narrative   06/14/2009 designated party form signed appointing wife Salik Grewell; Ok to leave message on home answering machine at [phone]    Review of Systems:  All systems reviewed.  They are negative to the above problem except as previously stated.  Vital Signs: BP 122/73  Pulse 73  Ht 6' (1.829 m)  Wt 239 lb (108.41 kg)  BMI 32.41 kg/m2  Physical Exam  HEENT:  Normocephalic, atraumatic. EOMI, PERRLA.  Neck: JVP is normal. No thyromegaly. No bruits.  Lungs: clear to auscultation. No rales no wheezes.  Heart: Regular rate and rhythm. Normal S1, S2. No S3.   No significant murmurs. PMI not displaced.  Abdomen:  Supple, nontender. Normal bowel sounds. No masses. No hepatomegaly.  Extremities:   Good distal pulses throughout. No lower extremity edema.  Musculoskeletal :moving all extremities.  Neuro:   alert and oriented x3.  CN II-XII grossly intact.  EKG  SR.  73 bpm.  Assessment and Plan:

## 2010-08-22 NOTE — Assessment & Plan Note (Signed)
Good control

## 2010-08-22 NOTE — Patient Instructions (Signed)
Lab work today. Lipid and ast. We will call you with results.  Your physician wants you to follow-up in: 12 months. You will receive a reminder letter in the mail two months in advance. If you don't receive a letter, please call our office to schedule the follow-up appointment.

## 2010-08-22 NOTE — Assessment & Plan Note (Signed)
Stable   Will check on continuing plavix.

## 2010-10-21 LAB — HEMOGLOBIN A1C: Hgb A1c MFr Bld: 5.7

## 2010-10-21 LAB — BASIC METABOLIC PANEL
CO2: 24
CO2: 25
Calcium: 8.7
Calcium: 9
Chloride: 104
Chloride: 106
Creatinine, Ser: 1.24
GFR calc Af Amer: >60
GFR calc Af Amer: >60
GFR calc Af Amer: >60
GFR calc non Af Amer: >60
Glucose, Bld: 132 — ABNORMAL HIGH
Glucose, Bld: 99
Potassium: 3.8
Sodium: 136
Sodium: 140
Sodium: 140

## 2010-10-21 LAB — CBC
HCT: 41.2
Hemoglobin: 13.6
Hemoglobin: 13.8
Hemoglobin: 13.9
MCHC: 33.8
MCHC: 34.9
MCV: 89.5
MCV: 89.9
RBC: 4.34
RBC: 4.53
RBC: 4.58
RDW: 14.4 — ABNORMAL HIGH
RDW: 14.5 — ABNORMAL HIGH
WBC: 9.9

## 2010-10-21 LAB — DIFFERENTIAL
Basophils Absolute: 0
Basophils Relative: 1
Eosinophils Relative: 2
Monocytes Absolute: 0.6
Neutro Abs: 4.3

## 2010-10-21 LAB — PROTIME-INR: Prothrombin Time: 12.8

## 2010-10-21 LAB — HEPATIC FUNCTION PANEL
AST: 40 — ABNORMAL HIGH
Albumin: 3.9
Alkaline Phosphatase: 69
Total Bilirubin: 1.4 — ABNORMAL HIGH
Total Protein: 6.7

## 2010-10-21 LAB — CARDIAC PANEL(CRET KIN+CKTOT+MB+TROPI)
CK, MB: 9.1 — ABNORMAL HIGH
Relative Index: 1.7
Relative Index: 1.8
Relative Index: 2
Total CK: 499 — ABNORMAL HIGH
Troponin I: 0.55
Troponin I: 0.72

## 2010-10-21 LAB — POCT CARDIAC MARKERS: Troponin i, poc: 0.08 — ABNORMAL HIGH

## 2010-10-21 LAB — CK TOTAL AND CKMB (NOT AT ARMC)
Relative Index: 1.5
Total CK: 535 — ABNORMAL HIGH

## 2010-10-21 LAB — APTT: aPTT: 30

## 2010-10-21 LAB — LIPID PANEL
Cholesterol: 216 — ABNORMAL HIGH
HDL: 39 — ABNORMAL LOW

## 2010-10-25 ENCOUNTER — Telehealth: Payer: Self-pay | Admitting: Internal Medicine

## 2010-10-31 ENCOUNTER — Telehealth: Payer: Self-pay | Admitting: *Deleted

## 2010-10-31 NOTE — Telephone Encounter (Signed)
Called patient and advised him to stop taking Plavix per recommendation of Dr.Cooper and Dr.Ross.

## 2010-11-17 ENCOUNTER — Ambulatory Visit (INDEPENDENT_AMBULATORY_CARE_PROVIDER_SITE_OTHER): Payer: 59 | Admitting: Internal Medicine

## 2010-11-17 ENCOUNTER — Other Ambulatory Visit: Payer: Self-pay | Admitting: Internal Medicine

## 2010-11-17 ENCOUNTER — Encounter: Payer: Self-pay | Admitting: Internal Medicine

## 2010-11-17 VITALS — BP 124/78 | HR 65 | Temp 98.1°F | Resp 18 | Ht 72.0 in | Wt 247.0 lb

## 2010-11-17 DIAGNOSIS — Z23 Encounter for immunization: Secondary | ICD-10-CM

## 2010-11-17 DIAGNOSIS — R197 Diarrhea, unspecified: Secondary | ICD-10-CM

## 2010-11-17 MED ORDER — ATORVASTATIN CALCIUM 40 MG PO TABS: 40.0000 mg | ORAL_TABLET | Freq: Every day | ORAL | Status: DC

## 2010-11-17 NOTE — Patient Instructions (Signed)
Lots of fluids Bland diet (crakers, sout, gatorade) peptobismol as needed Call any time if fever, severe symptoms

## 2010-11-17 NOTE — Progress Notes (Signed)
  Subjective:    Patient ID: [name] [name], male    DOB: 08-08-1950, 60 y.o.   MRN: [id number]  HPI 3 days ago developed nausea and vomiting for 24 hours. He also had diarrhea, stools were watery and abundant, diarrhea was usually preceded by abdominal cramps and triggered by po intake . At this point, nausea or vomiting has decreased, cramps have decreased as well and so far today he has not had any diarrhea. He went to a football game 5 days ago he ate from the stands. Other family members are doing well.  Past Medical History  Diagnosis Date  . Hyperlipidemia   . Hypertension   . Coronary artery disease     S/P stent Summer 2008  . Erectile dysfunction   . Hyperglycemia     A1c 6.1 (11-2008)   Past Surgical History  Procedure Date  . Wrist surgery     fractured wrist, Dr. Amanda Pea  . Ptca     stent  . Colonoscopy     negative    Review of Systems Subjective fever with the onset of symptoms No blood in the stools No hematemesis.     Objective:   Physical Exam  Constitutional: He appears well-developed and well-nourished.  HENT:  Head: Normocephalic and atraumatic.  Eyes: No scleral icterus.  Cardiovascular: Normal rate, regular rhythm and normal heart sounds.   No murmur heard. Pulmonary/Chest: Effort normal and breath sounds normal. No respiratory distress. He has no wheezes. He has no rales.  Abdominal:       Nondistended, soft, slightly increased bowel sounds, very mild lower abdominal tenderness bilaterally  Musculoskeletal: He exhibits no edema.          Assessment & Plan:  Acute diarrhea: Related to food vs. viral. I know about other cases of acute diarrhea in town. Recommend conservative treatment, see instructions, will call if no better.  Due for a physical exam, states he will schedule a

## 2010-11-17 NOTE — Telephone Encounter (Signed)
Please call generic lipitor into [facility] at [phone]

## 2011-01-15 ENCOUNTER — Other Ambulatory Visit: Payer: Self-pay | Admitting: Internal Medicine

## 2011-01-16 ENCOUNTER — Other Ambulatory Visit: Payer: Self-pay

## 2011-03-13 ENCOUNTER — Ambulatory Visit (INDEPENDENT_AMBULATORY_CARE_PROVIDER_SITE_OTHER): Payer: 59 | Admitting: Family Medicine

## 2011-03-13 ENCOUNTER — Encounter: Payer: Self-pay | Admitting: Family Medicine

## 2011-03-13 VITALS — BP 135/80 | HR 75 | Temp 98.3°F | Ht 71.25 in | Wt 242.2 lb

## 2011-03-13 DIAGNOSIS — J209 Acute bronchitis, unspecified: Secondary | ICD-10-CM

## 2011-03-13 MED ORDER — GUAIFENESIN-CODEINE 100-10 MG/5ML PO SYRP: 10.0000 mL | ORAL_SOLUTION | Freq: Three times a day (TID) | ORAL | Status: AC | PRN

## 2011-03-13 MED ORDER — AZITHROMYCIN 250 MG PO TABS: ORAL_TABLET | ORAL | Status: AC

## 2011-03-13 NOTE — Progress Notes (Signed)
  Subjective:    Patient ID: [name] [name], male    DOB: [birth year], 61 y.o.   MRN: [id number]  HPI URI- sxs started 1 week ago.  Took some cold meds, continued to work.  Developed chills at work, came home.  By Wednesday was having alternating sweats and chills- stayed in bed, felt too bad to come to office.  Thursday sxs had mildly improved.  sxs plateaued over the weekend.  + cough- productive.  Worse w/ lying down.  No ear pain, sore throat, facial pain, nasal congestion.  + sick contacts- mom had PNA.   Review of Systems For ROS see HPI     Objective:   Physical Exam  Constitutional: He appears well-developed and well-nourished. No distress.  HENT:  Head: Normocephalic and atraumatic.  Right Ear: Tympanic membrane normal.  Left Ear: Tympanic membrane normal.  Nose: No mucosal edema or rhinorrhea. Right sinus exhibits no maxillary sinus tenderness and no frontal sinus tenderness. Left sinus exhibits no maxillary sinus tenderness and no frontal sinus tenderness.  Mouth/Throat: Mucous membranes are normal. No oropharyngeal exudate, posterior oropharyngeal edema or posterior oropharyngeal erythema.  Eyes: Conjunctivae and EOM are normal. Pupils are equal, round, and reactive to light.  Neck: Normal range of motion. Neck supple.  Cardiovascular: Normal rate, regular rhythm and normal heart sounds.   Pulmonary/Chest: Effort normal and breath sounds normal. No respiratory distress. He has no wheezes.       + hacking cough  Lymphadenopathy:    He has no cervical adenopathy.  Skin: Skin is warm and dry.          Assessment & Plan:

## 2011-03-13 NOTE — Patient Instructions (Signed)
This appears to be a bronchitis Start the Zpack as directed Continue the mucinex Use the cough syrup at night- it will make you drowsy Drink plenty of fluids Call with any questions or concerns Hang in there!

## 2011-03-14 NOTE — Assessment & Plan Note (Signed)
Given pt's sxs and exposure to PNA will start abx.  Cough syrup prn.  Reviewed supportive care and red flags that should prompt return.  Pt expressed understanding and is in agreement w/ plan.

## 2011-03-15 ENCOUNTER — Telehealth: Payer: Self-pay | Admitting: *Deleted

## 2011-03-15 ENCOUNTER — Encounter: Payer: Self-pay | Admitting: *Deleted

## 2011-03-15 DIAGNOSIS — Z0279 Encounter for issue of other medical certificate: Secondary | ICD-10-CM

## 2011-03-15 NOTE — Telephone Encounter (Signed)
Ok for note to return tomorrow

## 2011-03-15 NOTE — Telephone Encounter (Signed)
Pt needs notes stating that it is ok to return to work on tomorrow. Pt seen on 03-13-11.Please advise

## 2011-03-15 NOTE — Telephone Encounter (Signed)
Discuss with patient, placed up front for pick-up.

## 2011-08-18 ENCOUNTER — Ambulatory Visit (INDEPENDENT_AMBULATORY_CARE_PROVIDER_SITE_OTHER): Payer: 59 | Admitting: Family Medicine

## 2011-08-18 VITALS — BP 98/62 | HR 79 | Temp 98.3°F | Wt 252.0 lb

## 2011-08-18 DIAGNOSIS — J069 Acute upper respiratory infection, unspecified: Secondary | ICD-10-CM

## 2011-08-18 DIAGNOSIS — L989 Disorder of the skin and subcutaneous tissue, unspecified: Secondary | ICD-10-CM

## 2011-08-18 HISTORY — DX: Acute upper respiratory infection, unspecified: J06.9

## 2011-08-18 NOTE — Assessment & Plan Note (Signed)
New.  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  Will refer to derm to r/o non-melanoma skin cancer.  Pt expressed understanding and is in agreement w/ plan.

## 2011-08-18 NOTE — Assessment & Plan Note (Signed)
New.  Pt's sxs are resolving w/out intervention.  Note provided to return to work on Monday.

## 2011-08-18 NOTE — Progress Notes (Signed)
  Subjective:    Patient ID: [name] [name], male    DOB: December 30, 1950, 61 y.o.   MRN: [id number]  HPI URI- sxs started Monday, feels '100% better than yesterday'.  Had chills on Monday and Tuesday but no documented fevers.  sxs were mostly nasal.  No chest congestion.  Needs note to return to work.  Growth on stomach- noticed when bathing, 'a little bump'.  Now area has enlarged and is red.  Not painful.   Review of Systems For ROS see HPI     Objective:   Physical Exam  Constitutional: He appears well-developed and well-nourished. No distress.  HENT:  Head: Normocephalic and atraumatic.  Right Ear: Tympanic membrane normal.  Left Ear: Tympanic membrane normal.  Nose: No mucosal edema or rhinorrhea. Right sinus exhibits no maxillary sinus tenderness and no frontal sinus tenderness. Left sinus exhibits no maxillary sinus tenderness and no frontal sinus tenderness.  Mouth/Throat: Mucous membranes are normal. No oropharyngeal exudate, posterior oropharyngeal edema or posterior oropharyngeal erythema.  Eyes: Conjunctivae and EOM are normal. Pupils are equal, round, and reactive to light.  Neck: Normal range of motion. Neck supple.  Cardiovascular: Normal rate, regular rhythm and normal heart sounds.   Pulmonary/Chest: Effort normal and breath sounds normal. No respiratory distress. He has no wheezes.       + dry cough  Lymphadenopathy:    He has no cervical adenopathy.  Skin: Skin is warm and dry.       1 cm erythematous, scaling lesion on upper abd          Assessment & Plan:

## 2011-08-18 NOTE — Patient Instructions (Addendum)
Follow up as needed If your respiratory symptoms return- please call We'll call you with your derm appt Call with any questions or concerns Hang in there!!!

## 2011-11-20 ENCOUNTER — Other Ambulatory Visit: Payer: Self-pay | Admitting: Internal Medicine

## 2011-11-20 MED ORDER — METOPROLOL SUCCINATE ER 100 MG PO TB24: ORAL_TABLET | ORAL | Status: DC

## 2011-11-20 NOTE — Telephone Encounter (Signed)
[facility] refill

## 2011-11-22 ENCOUNTER — Other Ambulatory Visit: Payer: Self-pay | Admitting: Internal Medicine

## 2011-11-23 NOTE — Telephone Encounter (Signed)
Pt needs appointment then refill can be made Fax Received. Refill Completed. [name] [name] (R.M.A)   

## 2011-11-24 ENCOUNTER — Other Ambulatory Visit: Payer: Self-pay | Admitting: Internal Medicine

## 2011-11-24 MED ORDER — METOPROLOL SUCCINATE ER 100 MG PO TB24: ORAL_TABLET | ORAL | Status: DC

## 2011-12-05 ENCOUNTER — Telehealth: Payer: Self-pay | Admitting: Internal Medicine

## 2011-12-05 NOTE — Telephone Encounter (Signed)
New Problem:    Patient called in because he has not received his metoprolol succinate (TOPROL-XL) 100 MG 24 hr tablet For the past 6 weeks, despite a call to our office and repeated calls to his mail order pharmacy, and has not seen any negative effects.  Patient would like to know why he still needs to take this medication when it is such a hassle to keep up with it.  Please call back.

## 2011-12-05 NOTE — Telephone Encounter (Signed)
[facility] and they state that they did not receive the refill we sent on 11/15 so I gave them a new order for Metoprolol XL 100mg  1 every day #90 with 3 refills. Attempted to call patient but he does not answer his cell phone and has no voice mail set up so I called his wife at home and advised her of above. She will let him know that he needs to call us back to make an appointment to see Dr.Ross in January.

## 2012-04-11 ENCOUNTER — Encounter: Payer: Self-pay | Admitting: Internal Medicine

## 2012-05-03 ENCOUNTER — Ambulatory Visit: Payer: 59 | Admitting: Internal Medicine

## 2012-05-17 ENCOUNTER — Ambulatory Visit: Payer: 59 | Admitting: Cardiovascular Disease

## 2012-11-28 ENCOUNTER — Other Ambulatory Visit: Payer: Self-pay | Admitting: Internal Medicine

## 2013-02-26 ENCOUNTER — Other Ambulatory Visit: Payer: Self-pay | Admitting: Internal Medicine

## 2014-01-09 DIAGNOSIS — I219 Acute myocardial infarction, unspecified: Secondary | ICD-10-CM

## 2014-01-09 HISTORY — DX: Acute myocardial infarction, unspecified: I21.9

## 2014-03-30 ENCOUNTER — Telehealth (HOSPITAL_COMMUNITY): Payer: Self-pay | Admitting: Cardiology

## 2014-03-30 NOTE — Telephone Encounter (Signed)
Received records from [facility] for appointment with Dr Antoine Poche on 04/28/14.  Records given to [facility] (medical records) for Dr Hochrein's schedule on 04/28/14. lp

## 2014-04-28 ENCOUNTER — Ambulatory Visit (INDEPENDENT_AMBULATORY_CARE_PROVIDER_SITE_OTHER): Payer: 59 | Admitting: Cardiology

## 2014-04-28 ENCOUNTER — Encounter: Payer: Self-pay | Admitting: Cardiology

## 2014-04-28 VITALS — BP 134/82 | HR 62 | Ht 72.0 in | Wt 228.0 lb

## 2014-04-28 DIAGNOSIS — I2510 Atherosclerotic heart disease of native coronary artery without angina pectoris: Secondary | ICD-10-CM | POA: Diagnosis not present

## 2014-04-28 NOTE — Patient Instructions (Signed)
Your physician wants you to follow-up in: SIX MONTHS. You will receive a reminder letter in the mail two months in advance. If you don't receive a letter, please call our office to schedule the follow-up appointment.

## 2014-04-28 NOTE — Progress Notes (Signed)
Cardiology Office Note   Date:  04/28/2014   ID:  [name] [name], DOB Nov 22, 1950, MRN [id number]  PCP:  Mliss Sax, MD  Cardiologist:   Rollene Rotunda, MD   Chief Complaint  Patient presents with  . Coronary Artery Disease      History of Present Illness: [name] [name] is a 64 y.o. male who presents for evaluation of coronary disease. He was seen in this practice up until about 4 years ago. He was spending more time in [place] after that. He had a previous infarct with stenting of his circumflex coronary arteryin the past. He's not had any problems although I do see it 2010 catheterization in our system as well. He has some chest discomfort earlier this year and had a cath in [place] and was said to have had a non-Q-wave myocardial infarction. He was found to have a patent stent and nonobstructive disease as described below. Of note his ejection fraction was mildly reduced. I discussed this with him today and reviewed an echo that was done in January which indeed showed 45% global hypokinesis. At the time he had his non-Q-wave he was not taking some of his medicines as he had been out. The only change if he was to start him on Plavix.  He's relocating permanently back here and reestablishing. He denies any ongoing symptoms. He is very active. The patient denies any new symptoms such as chest discomfort, neck or arm discomfort. There has been no new shortness of breath, PND or orthopnea. There have been no reported palpitations, presyncope or syncope.  Past Medical History  Diagnosis Date  . Hyperlipidemia   . Hypertension   . Coronary artery disease     S/P stent Summer 2008, January 2016 left main 30% stenosis, LAD mid 50% stenosis, patent stent in the mid right coronary artery with ectasia proximal and distal to the stent. EF mildly reduced at 40% with global hypokinesis.  . Erectile dysfunction   . Diabetes mellitus     A1c 6.1 (11-2008)    Past Surgical History    Procedure Laterality Date  . Wrist surgery      fractured wrist, Dr. Amanda Pea  . Colonoscopy      negative     Current Outpatient Prescriptions  Medication Sig Dispense Refill  . aspirin 81 MG tablet Take 81 mg by mouth daily.      Marland Kitchen atorvastatin (LIPITOR) 40 MG tablet TAKE 1 TABLET DAILY 90 tablet 0  . clopidogrel (PLAVIX) 75 MG tablet TAKE 1 TABLET DAILY 90 tablet 0  . lisinopril (PRINIVIL,ZESTRIL) 5 MG tablet Take 1 tablet by mouth daily.    . metFORMIN (GLUCOPHAGE) 500 MG tablet Take 1 tablet by mouth daily.    . metoprolol succinate (TOPROL-XL) 100 MG 24 hr tablet TAKE 1 TABLET DAILY 90 tablet 0  . nitroGLYCERIN (NITROSTAT) 0.4 MG SL tablet Place 0.4 mg under the tongue every 5 (five) minutes as needed.       No current facility-administered medications for this visit.    Allergies:   Review of patient's allergies indicates no known allergies.    Social History:  The patient  reports that he quit smoking about 11 years ago. He does not have any smokeless tobacco history on file. He reports that he drinks alcohol.   Family History:  The patient's family history includes CAD (age of onset: 42) in his brother; CAD (age of onset: 59) in his brother; Colon cancer in his father; Coronary  artery disease (age of onset: 53) in his father; Hypertension in his brother; Pernicious anemia in his maternal grandmother; Stroke in his maternal grandfather; Stroke (age of onset: 21) in his mother.    ROS:  Please see the history of present illness.   Otherwise, review of systems are positive for none.   All other systems are reviewed and negative.   PHYSICAL EXAM: VS:  BP 134/82 mmHg  Pulse 62  Ht 6' (1.829 m)  Wt 228 lb (103.42 kg)  BMI 30.92 kg/m2 , BMI Body mass index is 30.92 kg/(m^2). GENERAL:  Well appearing HEENT:  Pupils equal round and reactive, fundi not visualized, oral mucosa unremarkable NECK:  No jugular venous distention, waveform within normal limits, carotid upstroke brisk  and symmetric, no bruits, no thyromegaly LYMPHATICS:  No cervical, inguinal adenopathy LUNGS:  Clear to auscultation bilaterally BACK:  No CVA tenderness CHEST:  Unremarkable HEART:  PMI not displaced or sustained,S1 and S2 within normal limits, no S3, no S4, no clicks, no rubs, no murmurs ABD:  Flat, positive bowel sounds normal in frequency in pitch, no bruits, no rebound, no guarding, no midline pulsatile mass, no hepatomegaly, no splenomegaly EXT:  2 plus pulses throughout, no edema, no cyanosis no clubbing SKIN:  No rashes no nodules NEURO:  Cranial nerves II through XII grossly intact, motor grossly intact throughout PSYCH:  Cognitively intact, oriented to person place and time    EKG:  EKG is ordered today. The ekg ordered today demonstrates normal sinus rhythm, rate 62, axis within normal limits, intervals within normal limits, no acute ST-T wave changes.   Recent Labs: No results found for requested labs within last 365 days.    Lipid Panel    Component Value Date/Time   CHOL 140 08/22/2010 1101   TRIG 124.0 08/22/2010 1101   HDL 35.30* 08/22/2010 1101   CHOLHDL 4 08/22/2010 1101   VLDL 24.8 08/22/2010 1101   LDLCALC 80 08/22/2010 1101      Wt Readings from Last 3 Encounters:  04/28/14 228 lb (103.42 kg)  08/18/11 252 lb (114.306 kg)  03/13/11 242 lb 3.2 oz (109.861 kg)      Other studies Reviewed: Additional studies/ records that were reviewed today include: Outside cath and echo report.  Hospital records. Review of the above records demonstrates:  Please see elsewhere in the note.     ASSESSMENT AND PLAN:  CAD:  The patient has no ongoing symptoms. We talked quite a while about secondary risk reduction. I think he is on a good regimen and doing a good job with lifestyle. For now I'm going to continue DAPT because of his non-Q-wave earlier this year but I would reconsider this probably in a year.  OVERWEIGHT:  The patient understands the need to lose weight  with diet and exercise. We have discussed specific strategies for this.  REDUCED EF:  His EF is mildly reduced. He will continue with the meds as listed. I will reassess with an echo probably in January of next year.   Current medicines are reviewed at length with the patient today.  The patient has concerns regarding medicines.  The following changes have been made:  no change  Labs/ tests ordered today include: None     Disposition:   FU with me in six months    Signed, Rollene Rotunda, MD  04/28/2014 5:46 PM    [facility]

## 2015-10-06 ENCOUNTER — Encounter (HOSPITAL_COMMUNITY): Payer: Self-pay

## 2015-10-06 ENCOUNTER — Emergency Department (HOSPITAL_COMMUNITY)
Admission: EM | Admit: 2015-10-06 | Discharge: 2015-10-06 | Disposition: A | Discharge status: Home / Self Care | Payer: BLUE CROSS/BLUE SHIELD | Attending: Emergency Medicine | Admitting: Emergency Medicine

## 2015-10-06 DIAGNOSIS — Z7982 Long term (current) use of aspirin: Secondary | ICD-10-CM | POA: Insufficient documentation

## 2015-10-06 DIAGNOSIS — Y999 Unspecified external cause status: Secondary | ICD-10-CM | POA: Insufficient documentation

## 2015-10-06 DIAGNOSIS — I10 Essential (primary) hypertension: Secondary | ICD-10-CM | POA: Insufficient documentation

## 2015-10-06 DIAGNOSIS — M549 Dorsalgia, unspecified: Secondary | ICD-10-CM | POA: Insufficient documentation

## 2015-10-06 DIAGNOSIS — Z87891 Personal history of nicotine dependence: Secondary | ICD-10-CM | POA: Diagnosis not present

## 2015-10-06 DIAGNOSIS — E119 Type 2 diabetes mellitus without complications: Secondary | ICD-10-CM | POA: Diagnosis not present

## 2015-10-06 DIAGNOSIS — Y929 Unspecified place or not applicable: Secondary | ICD-10-CM | POA: Diagnosis not present

## 2015-10-06 DIAGNOSIS — Z23 Encounter for immunization: Secondary | ICD-10-CM | POA: Diagnosis not present

## 2015-10-06 DIAGNOSIS — S61012A Laceration without foreign body of left thumb without damage to nail, initial encounter: Secondary | ICD-10-CM | POA: Diagnosis present

## 2015-10-06 DIAGNOSIS — I2510 Atherosclerotic heart disease of native coronary artery without angina pectoris: Secondary | ICD-10-CM | POA: Diagnosis not present

## 2015-10-06 DIAGNOSIS — Y9389 Activity, other specified: Secondary | ICD-10-CM | POA: Diagnosis not present

## 2015-10-06 DIAGNOSIS — S61219A Laceration without foreign body of unspecified finger without damage to nail, initial encounter: Secondary | ICD-10-CM

## 2015-10-06 DIAGNOSIS — Z7984 Long term (current) use of oral hypoglycemic drugs: Secondary | ICD-10-CM | POA: Insufficient documentation

## 2015-10-06 DIAGNOSIS — W260XXA Contact with knife, initial encounter: Secondary | ICD-10-CM | POA: Diagnosis not present

## 2015-10-06 HISTORY — DX: Nonrheumatic mitral (valve) insufficiency: I34.0

## 2015-10-06 MED ORDER — TETANUS-DIPHTH-ACELL PERTUSSIS 5-2.5-18.5 LF-MCG/0.5 IM SUSP
0.5000 mL | Freq: Once | INTRAMUSCULAR | Status: AC
  Administered 2015-10-06: 0.5 mL via INTRAMUSCULAR
  Filled 2015-10-06: qty 0.5

## 2015-10-06 MED ORDER — LIDOCAINE HCL (PF) 1 % IJ SOLN: 5.0000 mL | Freq: Once | INTRAMUSCULAR | Status: DC

## 2015-10-06 MED ORDER — LIDOCAINE HCL (PF) 1 % IJ SOLN
INTRAMUSCULAR | Status: AC
  Filled 2015-10-06: qty 5

## 2015-10-06 MED ORDER — HYDROCODONE-ACETAMINOPHEN 5-325 MG PO TABS: 2.0000 | ORAL_TABLET | ORAL | 0 refills | Status: DC | PRN

## 2015-10-06 NOTE — ED Provider Notes (Signed)
[facility]-EMERGENCY DEPT Provider Note   CSN: [id number] Arrival date & time: 10/06/15  1920     History   Chief Complaint Chief Complaint  Patient presents with  . Laceration    HPI [name] HERNAN is a 65 y.o. male.  Patient is a 65 year old male who presents to the emergency department with a complaint of laceration to the left thumb.   Patient states that he was cutting a tree limb, when the knife he was using slipped and cut his thumb, as well as scraped knuckles of his left hand. The patient states he was able to control the bleeding by applying pressure. He is unsure of the date of his last tetanus. The patient denies being on any anticoagulation medications. He is not had any recent operations or procedures involving the left hand. Applying pressure and holding the thumb still makes it feel better, movement and palpation make it worse.      Past Medical History:  Diagnosis Date  . Coronary artery disease    S/P stent Summer 2008, January 2016 left main 30% stenosis, LAD mid 50% stenosis, patent stent in the mid right coronary artery with ectasia proximal and distal to the stent. EF mildly reduced at 40% with global hypokinesis.  . Diabetes mellitus (HCC)    A1c 6.1 (11-2008)  . Erectile dysfunction   . Hyperlipidemia   . Hypertension   . MI (mitral incompetence)     Patient Active Problem List   Diagnosis Date Noted  . Skin lesion 08/18/2011  . URI (upper respiratory infection) 08/18/2011  . Bronchitis, acute 03/13/2011  . BACK PAIN 06/14/2009  . HYPERPLASIA PROSTATE UNS W/O UR OBST & OTH LUTS 11/25/2008  . HYPERGLYCEMIA, FASTING 11/25/2008  . HYPERTENSION 01/07/2007  . CORONARY ARTERY DISEASE 01/07/2007  . HYPERLIPIDEMIA 05/09/2006  . ERECTILE DYSFUNCTION 05/09/2006  . FRACTURE, WRIST 05/09/2006    Past Surgical History:  Procedure Laterality Date  . COLONOSCOPY     negative  . WRIST SURGERY     fractured wrist, Dr. Amanda Pea       Home Medications     Prior to Admission medications   Medication Sig Start Date End Date Taking? Authorizing Provider  aspirin 81 MG tablet Take 81 mg by mouth daily.      Historical Provider, MD  atorvastatin (LIPITOR) 40 MG tablet TAKE 1 TABLET DAILY 11/22/11   Pricilla Riffle, MD  clopidogrel (PLAVIX) 75 MG tablet TAKE 1 TABLET DAILY 01/15/11   Pricilla Riffle, MD  lisinopril (PRINIVIL,ZESTRIL) 5 MG tablet Take 1 tablet by mouth daily. 01/23/14   Historical Provider, MD  metFORMIN (GLUCOPHAGE) 500 MG tablet Take 1 tablet by mouth daily. 04/16/14   Historical Provider, MD  metoprolol succinate (TOPROL-XL) 100 MG 24 hr tablet TAKE 1 TABLET DAILY 11/28/12   Pricilla Riffle, MD  nitroGLYCERIN (NITROSTAT) 0.4 MG SL tablet Place 0.4 mg under the tongue every 5 (five) minutes as needed.      Historical Provider, MD    Family History Family History  Problem Relation Age of Onset  . Pernicious anemia Maternal Grandmother   . Coronary artery disease Father 37  . Colon cancer Father     ?  . Stroke Mother 18  . Hypertension Brother   . Stroke Maternal Grandfather   . CAD Brother 74  . CAD Brother 26    Social History Social History  Substance Use Topics  . Smoking status: Former Smoker    Quit date: 01/10/2003  .  Smokeless tobacco: Never Used  . Alcohol use Yes     Comment: rarely     Allergies   Review of patient's allergies indicates no known allergies.   Review of Systems Review of Systems  Constitutional: Negative for activity change.       All ROS Neg except as noted in HPI  HENT: Negative for nosebleeds.   Eyes: Negative for photophobia and discharge.  Respiratory: Negative for cough, shortness of breath and wheezing.   Cardiovascular: Negative for chest pain and palpitations.  Gastrointestinal: Negative for abdominal pain and blood in stool.  Genitourinary: Negative for dysuria, frequency and hematuria.  Musculoskeletal: Positive for back pain. Negative for arthralgias and neck pain.  Skin: Positive  for wound.       laceration  Neurological: Negative for dizziness, seizures and speech difficulty.  Psychiatric/Behavioral: Negative for confusion and hallucinations.     Physical Exam Updated Vital Signs BP (!) 132/104 (BP Location: Right Arm)   Pulse 80   Temp 98.8 F (37.1 C) (Temporal)   Resp 20   Ht 6' (1.829 m)   Wt 104.3 kg   SpO2 98%   BMI 31.19 kg/m   Physical Exam  Constitutional: He is oriented to person, place, and time. He appears well-developed and well-nourished.  Non-toxic appearance.  HENT:  Head: Normocephalic.  Right Ear: Tympanic membrane and external ear normal.  Left Ear: Tympanic membrane and external ear normal.  Eyes: EOM and lids are normal. Pupils are equal, round, and reactive to light.  Neck: Normal range of motion. Neck supple. Carotid bruit is not present.  Cardiovascular: Normal rate, regular rhythm, normal heart sounds, intact distal pulses and normal pulses.   Pulmonary/Chest: Breath sounds normal. No respiratory distress.  Abdominal: Soft. Bowel sounds are normal. There is no tenderness. There is no guarding.  Musculoskeletal: Normal range of motion.  3 cm laceration to the palmar surface of the left thumb in the DIP joint region.  Lymphadenopathy:       Head (right side): No submandibular adenopathy present.       Head (left side): No submandibular adenopathy present.    He has no cervical adenopathy.  Neurological: He is alert and oriented to person, place, and time. He has normal strength. No cranial nerve deficit or sensory deficit.  Skin: Skin is warm and dry.  Psychiatric: He has a normal mood and affect. His speech is normal.  Nursing note and vitals reviewed.    ED Treatments / Results  Labs (all labs ordered are listed, but only abnormal results are displayed) Labs Reviewed - No data to display  EKG  EKG Interpretation None       Radiology No results found.  Procedures .Marland Kitchen.Laceration Repair Date/Time: 10/06/2015  9:50 PM Performed by: Ivery QualeBRYANT, Cleaster Shiffer Authorized by: Ivery QualeBRYANT, Erinn Mendosa   Consent:    Consent obtained:  Verbal   Consent given by:  Patient   Risks discussed:  Infection and pain Anesthesia (see MAR for exact dosages):    Anesthesia method:  Nerve block   Block anesthetic:  Lidocaine 1% w/o epi   Block injection procedure:  Anatomic landmarks identified, introduced needle, incremental injection, anatomic landmarks palpated and negative aspiration for blood   Block outcome:  Anesthesia achieved Laceration details:    Location:  Finger   Length (cm):  3 Repair type:    Repair type:  Simple Pre-procedure details:    Preparation:  Patient was prepped and draped in usual sterile fashion Exploration:  Wound exploration: wound explored through full range of motion     Wound extent: no foreign bodies/material noted, no muscle damage noted and no tendon damage noted     Contaminated: no   Treatment:    Area cleansed with:  Betadine   Amount of cleaning:  Standard   Irrigation solution:  Sterile saline Skin repair:    Repair method:  Sutures   Suture size:  4-0   Suture material:  Nylon   Suture technique:  Simple interrupted   Number of sutures:  7 Approximation:    Approximation:  Close   Vermilion border: well-aligned   Post-procedure details:    Dressing:  Sterile dressing   Patient tolerance of procedure:  Tolerated well, no immediate complications   (including critical care time)  Medications Ordered in ED Medications  Tdap (BOOSTRIX) injection 0.5 mL (0.5 mLs Intramuscular Given 10/06/15 1948)     Initial Impression / Assessment and Plan / ED Course  I have reviewed the triage vital signs and the nursing notes.  Pertinent labs & imaging results that were available during my care of the patient were reviewed by me and considered in my medical decision making (see chart for details).  Clinical Course    *I have reviewed nursing notes, vital signs, and all appropriate  lab and imaging results for this patient.**  Final Clinical Impressions(s) / ED Diagnoses  Discussed with the patient the need to keep the wound of his left thumb clean and dry. We discuss reason to return if signs of advancing infection on. Patient will have the sutures removed in 7 or 8 days. He will return sooner if any signs of advancing infection.    Final diagnoses:  Laceration of finger, initial encounter    New Prescriptions New Prescriptions   No medications on file     Ivery Quale, PA-C 10/07/15 1841    Lavera Guise, MD 10/09/15 [phone]

## 2015-10-06 NOTE — Discharge Instructions (Signed)
Please keep your wound clean and dry. Please have your sutures removed in 7 or 8 days. Use Tylenol for mild pain, may use Norco for more severe pain. This medication may cause drowsiness. Please do not drink, drive, or participate in activity that requires concentration while taking this medication.

## 2015-10-06 NOTE — ED Triage Notes (Signed)
Was trying to cut a tree limb with a knife. Laceration to left thumb,index, middle and ring finger. Bleeding controlled in triage. Tetanus unknown.

## 2015-10-11 MED FILL — Hydrocodone-Acetaminophen Tab 5-325 MG: ORAL | Qty: 6 | Status: AC

## 2015-11-09 ENCOUNTER — Telehealth: Payer: Self-pay | Admitting: Cardiology

## 2015-11-09 MED ORDER — CLOPIDOGREL BISULFATE 75 MG PO TABS: 75.0000 mg | ORAL_TABLET | Freq: Every day | ORAL | 0 refills | Status: DC

## 2015-11-09 MED ORDER — NITROGLYCERIN 0.4 MG SL SUBL: 0.4000 mg | SUBLINGUAL_TABLET | SUBLINGUAL | 12 refills | Status: DC | PRN

## 2015-11-09 MED ORDER — METOPROLOL SUCCINATE ER 100 MG PO TB24: 100.0000 mg | ORAL_TABLET | Freq: Every day | ORAL | 0 refills | Status: DC

## 2015-11-09 MED ORDER — ATORVASTATIN CALCIUM 40 MG PO TABS: 40.0000 mg | ORAL_TABLET | Freq: Every day | ORAL | 0 refills | Status: DC

## 2015-11-09 MED ORDER — LISINOPRIL 5 MG PO TABS: 5.0000 mg | ORAL_TABLET | Freq: Every day | ORAL | 0 refills | Status: DC

## 2015-11-09 NOTE — Telephone Encounter (Signed)
Refill sent to the pharmacy electronically.  

## 2015-11-09 NOTE — Telephone Encounter (Signed)
Patient has an appt12/5 with Hochrein and needs refills on all of his medications.  He uses the [facility] in [place]

## 2015-12-14 ENCOUNTER — Ambulatory Visit: Payer: BLUE CROSS/BLUE SHIELD | Admitting: Cardiology

## 2015-12-26 NOTE — Progress Notes (Deleted)
Cardiology Office Note   Date:  12/26/2015   ID:  [name] ChamberWilliam D Halberg, DOB 06/17/1950, MRN [id number]  PCP:  Leo GrosserPICKARD,WARREN TOM, MD  Cardiologist:   Rollene RotundaJames Jakyle Petrucelli, MD   No chief complaint on file.     History of Present Illness: [name] [name] is a 65 y.o. male who presents for evaluation of coronary disease.  He was seen in April of last year. He had a previous infarct with stenting of his circumflex coronary arteryin the past. 2010 catheterization in our system.  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 He was found to have a patent stent and nonobstructive disease as described below. Of note his ejection fraction was mildly reduced. Echo that was done in January of last year showed 45% global hypokinesis.  ***    At the time he had his non-Q-wave he was not taking some of his medicines as he had been out. The only change if he was to start him on Plavix.  He's relocating permanently back here and reestablishing. He denies any ongoing symptoms. He is very active. The patient denies any new symptoms such as chest discomfort, neck or arm discomfort. There has been no new shortness of breath, PND or orthopnea. There have been no reported palpitations, presyncope or syncope.  Past Medical History:  Diagnosis Date  . Coronary artery disease    S/P stent Summer 2008, January 2016 left main 30% stenosis, LAD mid 50% stenosis, patent stent in the mid right coronary artery with ectasia proximal and distal to the stent. EF mildly reduced at 40% with global hypokinesis.  . Diabetes mellitus (HCC)    A1c 6.1 (11-2008)  . Erectile dysfunction   . Hyperlipidemia   . Hypertension   . MI (mitral incompetence)     Past Surgical History:  Procedure Laterality Date  . COLONOSCOPY     negative  . WRIST SURGERY     fractured wrist, Dr. Amanda PeaGramig     Current Outpatient Prescriptions  Medication Sig Dispense Refill  . aspirin 81  MG tablet Take 81 mg by mouth daily.      Marland Kitchen. atorvastatin (LIPITOR) 40 MG tablet Take 1 tablet (40 mg total) by mouth daily. 90 tablet 0  . clopidogrel (PLAVIX) 75 MG tablet Take 1 tablet (75 mg total) by mouth daily. 90 tablet 0  . HYDROcodone-acetaminophen (NORCO/VICODIN) 5-325 MG tablet Take 2 tablets by mouth every 4 (four) hours as needed. 6 tablet 0  . lisinopril (PRINIVIL,ZESTRIL) 5 MG tablet Take 1 tablet (5 mg total) by mouth daily. 90 tablet 0  . metFORMIN (GLUCOPHAGE) 500 MG tablet Take 1 tablet by mouth daily.    . metoprolol succinate (TOPROL-XL) 100 MG 24 hr tablet Take 1 tablet (100 mg total) by mouth daily. Take with or immediately following a meal. 90 tablet 0  . nitroGLYCERIN (NITROSTAT) 0.4 MG SL tablet Place 1 tablet (0.4 mg total) under the tongue every 5 (five) minutes as needed. 25 tablet 12   No current facility-administered medications for this visit.     Allergies:   Patient has no known allergies.    ROS:  Please see the history of present illness.   Otherwise, review of systems are positive for none.   All other systems are reviewed and negative.   PHYSICAL EXAM: VS:  There were no vitals taken for this visit. , BMI There is no height or weight on file to  calculate BMI. GENERAL:  Well appearing NECK:  No jugular venous distention, waveform within normal limits, carotid upstroke brisk and symmetric, no bruits, no thyromegaly LUNGS:  Clear to auscultation bilaterally BACK:  No CVA tenderness CHEST:  Unremarkable HEART:  PMI not displaced or sustained,S1 and S2 within normal limits, no S3, no S4, no clicks, no rubs, no murmurs ABD:  Flat, positive bowel sounds normal in frequency in pitch, no bruits, no rebound, no guarding, no midline pulsatile mass, no hepatomegaly, no splenomegaly EXT:  2 plus pulses throughout, no edema, no cyanosis no clubbing   EKG:  EKG is *** ordered today. The ekg ordered today demonstrates normal sinus rhythm, rate 62, axis within  normal limits, intervals within normal limits, no acute ST-T wave changes.   Recent Labs: No results found for requested labs within last 8760 hours.    Lipid Panel    Component Value Date/Time   CHOL 140 08/22/2010 1101   TRIG 124.0 08/22/2010 1101   HDL 35.30 (L) 08/22/2010 1101   CHOLHDL 4 08/22/2010 1101   VLDL 24.8 08/22/2010 1101   LDLCALC 80 08/22/2010 1101      Wt Readings from Last 3 Encounters:  10/06/15 230 lb (104.3 kg)  04/28/14 228 lb (103.4 kg)  08/18/11 252 lb (114.3 kg)      Other studies Reviewed: Additional studies/ records that were reviewed today include: Outside cath and echo report.  Hospital records. Review of the above records demonstrates:  Please see elsewhere in the note.     ASSESSMENT AND PLAN:  CAD:  ***  OVERWEIGHT:  The patient understands the need to lose weight with diet and exercise. We have discussed specific strategies for this.  REDUCED EF:  His EF is mildly reduced. He will continue with the meds as listed. I will reassess with an echo probably in January of next year.   Current medicines are reviewed at length with the patient today.  The patient has concerns regarding medicines.  The following changes have been made:  no change  Labs/ tests ordered today include: ***    Disposition:   FU with me in ***   Signed, Rollene Rotunda, MD  12/26/2015 6:16 PM    [facility]

## 2015-12-27 ENCOUNTER — Ambulatory Visit: Payer: BLUE CROSS/BLUE SHIELD | Admitting: Cardiology

## 2016-02-15 NOTE — Progress Notes (Signed)
Cardiology Office Note   Date:  02/17/2016   ID:  [name] [name], DOB [birth year], MRN [id number]  PCP:  No primary care provider on file.  Cardiologist:   Rollene Rotunda, MD   Chief Complaint  Patient presents with  . Coronary Artery Disease      History of Present Illness: [name] [name] is a 66 y.o. male who presents for evaluation of coronary disease.Marland Kitchen He had a previous infarct with stenting of his circumflex coronary artery in the past. He's not had any problems although I do see it 2010 catheterization in our system as well. He has some chest discomfort in 2016 and had a cath in [place] and was said to have had a non-Q-wave myocardial infarction. He was found to have a patent stent and nonobstructive disease as described below. Of note his ejection fraction was mildly reduced. He had an EF of 45% global hypokinesis.  At the last visit I continued his DAPT.  He returns for follow up.  He moved and has been doing lots of work on new property including cutting trees.  The patient denies any new symptoms such as chest discomfort, neck or arm discomfort. There has been no new shortness of breath, PND or orthopnea. There have been no reported palpitations, presyncope or syncope.  Past Medical History:  Diagnosis Date  . Coronary artery disease    S/P stent Summer 2008, January 2016 left main 30% stenosis, LAD mid 50% stenosis, patent stent in the mid right coronary artery with ectasia proximal and distal to the stent. EF mildly reduced at 40% with global hypokinesis.  . Diabetes mellitus (HCC)    A1c 6.1 (11-2008)  . Erectile dysfunction   . Hyperlipidemia   . Hypertension   . MI (mitral incompetence)     Past Surgical History:  Procedure Laterality Date  . COLONOSCOPY     negative  . WRIST SURGERY     fractured wrist, Dr. Amanda Pea     Current Outpatient Prescriptions  Medication Sig Dispense Refill  . aspirin 81 MG tablet Take 81 mg by mouth daily.      Marland Kitchen atorvastatin  (LIPITOR) 40 MG tablet Take 1 tablet (40 mg total) by mouth daily. 90 tablet 3  . lisinopril (PRINIVIL,ZESTRIL) 5 MG tablet Take 1 tablet (5 mg total) by mouth daily. 90 tablet 0  . metFORMIN (GLUCOPHAGE) 500 MG tablet Take 1 tablet (500 mg total) by mouth daily. 90 tablet 3  . metoprolol succinate (TOPROL-XL) 100 MG 24 hr tablet Take 1 tablet (100 mg total) by mouth daily. Take with or immediately following a meal. 90 tablet 3  . nitroGLYCERIN (NITROSTAT) 0.4 MG SL tablet Place 1 tablet (0.4 mg total) under the tongue every 5 (five) minutes as needed. 25 tablet 2   No current facility-administered medications for this visit.     Allergies:   Patient has no known allergies.    ROS:  Please see the history of present illness.   Otherwise, review of systems are positive for none.   All other systems are reviewed and negative.   PHYSICAL EXAM: VS:  BP (!) 148/80   Pulse (!) 50   Ht 6' (1.829 m)   Wt 238 lb (108 kg)   BMI 32.28 kg/m  , BMI Body mass index is 32.28 kg/m. GENERAL:  Well appearing NECK:  No jugular venous distention, waveform within normal limits, carotid upstroke brisk and symmetric, no bruits, no thyromegaly LYMPHATICS:  No cervical, inguinal  adenopathy LUNGS:  Clear to auscultation bilaterally BACK:  No CVA tenderness CHEST:  Unremarkable HEART:  PMI not displaced or sustained,S1 and S2 within normal limits, no S3, no S4, no clicks, no rubs, no murmurs ABD:  Flat, positive bowel sounds normal in frequency in pitch, no bruits, no rebound, no guarding, no midline pulsatile mass, no hepatomegaly, no splenomegaly EXT:  2 plus pulses throughout, no edema, no cyanosis no clubbing   EKG:  EKG is  ordered today. The ekg ordered today demonstrates normal sinus rhythm, rate 50, axis within normal limits, intervals within normal limits, no acute ST-T wave changes.   Recent Labs: No results found for requested labs within last 8760 hours.    Lipid Panel    Component Value  Date/Time   CHOL 140 08/22/2010 1101   TRIG 124.0 08/22/2010 1101   HDL 35.30 (L) 08/22/2010 1101   CHOLHDL 4 08/22/2010 1101   VLDL 24.8 08/22/2010 1101   LDLCALC 80 08/22/2010 1101      Wt Readings from Last 3 Encounters:  02/17/16 238 lb (108 kg)  10/06/15 230 lb (104.3 kg)  04/28/14 228 lb (103.4 kg)      Other studies Reviewed: Additional studies/ records that were reviewed today include:  None Review of the above records demonstrates:     ASSESSMENT AND PLAN:  CAD:   The patient has no new sypmtoms.  No further cardiovascular testing is indicated.  We will continue with aggressive risk reduction and meds as listed.  OVERWEIGHT:  The patient understands the need to lose weight with diet and exercise. We have discussed specific strategies for this.  REDUCED EF:  His EF is mildly reduced. I will follow up with an echo this year.  DYSLIPIDEMIA:  I will check a fasting lipid profile and CMET.  DM:  He has no primary at this point.  I will check an A1c   Current medicines are reviewed at length with the patient today.  The patient has concerns regarding medicines.  The following changes have been made:  none  Labs/ tests ordered today include:   A1C, lipids and CMET    Disposition:   FU with me in 12 months    Signed, Rollene Rotunda, MD  02/17/2016 12:52 PM    [facility]

## 2016-02-17 ENCOUNTER — Ambulatory Visit (INDEPENDENT_AMBULATORY_CARE_PROVIDER_SITE_OTHER): Payer: BLUE CROSS/BLUE SHIELD | Admitting: Cardiology

## 2016-02-17 ENCOUNTER — Encounter: Payer: Self-pay | Admitting: Cardiology

## 2016-02-17 VITALS — BP 148/80 | HR 50 | Ht 72.0 in | Wt 238.0 lb

## 2016-02-17 DIAGNOSIS — E785 Hyperlipidemia, unspecified: Secondary | ICD-10-CM

## 2016-02-17 DIAGNOSIS — R7309 Other abnormal glucose: Secondary | ICD-10-CM | POA: Diagnosis not present

## 2016-02-17 DIAGNOSIS — Z79899 Other long term (current) drug therapy: Secondary | ICD-10-CM

## 2016-02-17 DIAGNOSIS — I429 Cardiomyopathy, unspecified: Secondary | ICD-10-CM | POA: Diagnosis not present

## 2016-02-17 DIAGNOSIS — I2510 Atherosclerotic heart disease of native coronary artery without angina pectoris: Secondary | ICD-10-CM

## 2016-02-17 MED ORDER — NITROGLYCERIN 0.4 MG SL SUBL: 0.4000 mg | SUBLINGUAL_TABLET | SUBLINGUAL | 2 refills | Status: DC | PRN

## 2016-02-17 MED ORDER — METOPROLOL SUCCINATE ER 100 MG PO TB24: 100.0000 mg | ORAL_TABLET | Freq: Every day | ORAL | 3 refills | Status: DC

## 2016-02-17 MED ORDER — METFORMIN HCL 500 MG PO TABS: 500.0000 mg | ORAL_TABLET | Freq: Every day | ORAL | 3 refills | Status: DC

## 2016-02-17 MED ORDER — ATORVASTATIN CALCIUM 40 MG PO TABS: 40.0000 mg | ORAL_TABLET | Freq: Every day | ORAL | 3 refills | Status: DC

## 2016-02-17 NOTE — Patient Instructions (Signed)
Medication Instructions:  Continue current medications  Labwork: CBC, CMP, A1c and Fasting Lipids  Testing/Procedures: Your physician has requested that you have an echocardiogram in [place]. Echocardiography is a painless test that uses sound waves to create images of your heart. It provides your doctor with information about the size and shape of your heart and how well your heart's chambers and valves are working. This procedure takes approximately one hour. There are no restrictions for this procedure.  Follow-Up: Your physician wants you to follow-up in: 1 Year. You will receive a reminder letter in the mail two months in advance. If you don't receive a letter, please call our office to schedule the follow-up appointment.   Any Other Special Instructions Will Be Listed Below (If Applicable).   If you need a refill on your cardiac medications before your next appointment, please call your pharmacy.

## 2016-02-21 ENCOUNTER — Other Ambulatory Visit: Payer: Self-pay | Admitting: Cardiology

## 2016-02-21 MED ORDER — LISINOPRIL 5 MG PO TABS: 5.0000 mg | ORAL_TABLET | Freq: Every day | ORAL | 3 refills | Status: DC

## 2016-02-21 NOTE — Telephone Encounter (Signed)
Rx(s) sent to pharmacy electronically.  

## 2016-02-21 NOTE — Telephone Encounter (Signed)
°*  STAT* If patient is at the pharmacy, call can be transferred to refill team.   1. Which medications need to be refilled? (please list name of each medication and dose if known) Lisinopril 5mg    2. Which pharmacy/location (including street and city if local pharmacy) is medication to be sent to?[facility] in [place]   3. Do they need a 30 day or 90 day supply? 90

## 2016-03-13 ENCOUNTER — Other Ambulatory Visit: Payer: Self-pay

## 2016-04-11 ENCOUNTER — Other Ambulatory Visit: Payer: BLUE CROSS/BLUE SHIELD

## 2016-04-11 ENCOUNTER — Ambulatory Visit (INDEPENDENT_AMBULATORY_CARE_PROVIDER_SITE_OTHER): Payer: BLUE CROSS/BLUE SHIELD

## 2016-04-11 ENCOUNTER — Other Ambulatory Visit: Payer: Self-pay

## 2016-04-11 DIAGNOSIS — R739 Hyperglycemia, unspecified: Secondary | ICD-10-CM

## 2016-04-11 DIAGNOSIS — I25119 Atherosclerotic heart disease of native coronary artery with unspecified angina pectoris: Secondary | ICD-10-CM

## 2016-04-11 DIAGNOSIS — I10 Essential (primary) hypertension: Secondary | ICD-10-CM

## 2016-04-11 DIAGNOSIS — I429 Cardiomyopathy, unspecified: Secondary | ICD-10-CM | POA: Diagnosis not present

## 2016-04-12 LAB — CBC WITH DIFFERENTIAL/PLATELET
BASOS ABS: 0 10*3/uL (ref 0.0–0.2)
Basos: 0 %
EOS (ABSOLUTE): 0.3 10*3/uL (ref 0.0–0.4)
Eos: 3 %
HEMOGLOBIN: 14.7 g/dL (ref 13.0–17.7)
Hematocrit: 43.3 % (ref 37.5–51.0)
IMMATURE GRANS (ABS): 0 10*3/uL (ref 0.0–0.1)
IMMATURE GRANULOCYTES: 0 %
LYMPHS: 33 %
Lymphocytes Absolute: 2.4 10*3/uL (ref 0.7–3.1)
MCH: 30.1 pg (ref 26.6–33.0)
MCHC: 33.9 g/dL (ref 31.5–35.7)
MCV: 89 fL (ref 79–97)
MONOCYTES: 9 %
Monocytes Absolute: 0.7 10*3/uL (ref 0.1–0.9)
NEUTROS PCT: 55 %
Neutrophils Absolute: 4 10*3/uL (ref 1.4–7.0)
PLATELETS: 216 10*3/uL (ref 150–379)
RBC: 4.89 x10E6/uL (ref 4.14–5.80)
RDW: 15.2 % (ref 12.3–15.4)
WBC: 7.3 10*3/uL (ref 3.4–10.8)

## 2016-04-12 LAB — BASIC METABOLIC PANEL
BUN / CREAT RATIO: 10 (ref 10–24)
BUN: 12 mg/dL (ref 8–27)
CALCIUM: 9.2 mg/dL (ref 8.6–10.2)
CHLORIDE: 101 mmol/L (ref 96–106)
CO2: 22 mmol/L (ref 18–29)
Creatinine, Ser: 1.24 mg/dL (ref 0.76–1.27)
GFR calc non Af Amer: 61 mL/min/{1.73_m2} (ref >59)
GFR, EST AFRICAN AMERICAN: 71 mL/min/{1.73_m2} (ref >59)
GLUCOSE: 93 mg/dL (ref 65–99)
Potassium: 4.4 mmol/L (ref 3.5–5.2)
SODIUM: 139 mmol/L (ref 134–144)

## 2016-04-12 LAB — HEMOGLOBIN A1C
ESTIMATED AVERAGE GLUCOSE: 128 mg/dL
Hgb A1c MFr Bld: 6.1 % — ABNORMAL HIGH (ref 4.8–5.6)

## 2016-04-12 LAB — LIPID PANEL
Chol/HDL Ratio: 3.5 ratio (ref 0.0–5.0)
Cholesterol, Total: 130 mg/dL (ref 100–199)
HDL: 37 mg/dL — ABNORMAL LOW (ref >39)
LDL Calculated: 69 mg/dL (ref 0–99)
Triglycerides: 122 mg/dL (ref 0–149)
VLDL CHOLESTEROL CAL: 24 mg/dL (ref 5–40)

## 2016-04-17 ENCOUNTER — Telehealth: Payer: Self-pay | Admitting: Cardiology

## 2016-04-17 NOTE — Telephone Encounter (Signed)
Pt aware of his Echo 

## 2016-04-17 NOTE — Telephone Encounter (Signed)
Routed to Nya 

## 2016-04-17 NOTE — Telephone Encounter (Signed)
New Message     Pt is returning your call about the echo result.

## 2016-11-07 DIAGNOSIS — I10 Essential (primary) hypertension: Secondary | ICD-10-CM | POA: Diagnosis not present

## 2016-11-07 DIAGNOSIS — E785 Hyperlipidemia, unspecified: Secondary | ICD-10-CM | POA: Diagnosis not present

## 2016-11-07 DIAGNOSIS — E1165 Type 2 diabetes mellitus with hyperglycemia: Secondary | ICD-10-CM | POA: Diagnosis not present

## 2016-11-07 DIAGNOSIS — Z23 Encounter for immunization: Secondary | ICD-10-CM | POA: Diagnosis not present

## 2016-11-20 DIAGNOSIS — R7301 Impaired fasting glucose: Secondary | ICD-10-CM | POA: Diagnosis not present

## 2016-11-20 DIAGNOSIS — I10 Essential (primary) hypertension: Secondary | ICD-10-CM | POA: Diagnosis not present

## 2016-11-24 DIAGNOSIS — Z0000 Encounter for general adult medical examination without abnormal findings: Secondary | ICD-10-CM | POA: Diagnosis not present

## 2016-11-24 DIAGNOSIS — E785 Hyperlipidemia, unspecified: Secondary | ICD-10-CM | POA: Diagnosis not present

## 2016-11-24 DIAGNOSIS — Q249 Congenital malformation of heart, unspecified: Secondary | ICD-10-CM | POA: Diagnosis not present

## 2016-11-24 DIAGNOSIS — E119 Type 2 diabetes mellitus without complications: Secondary | ICD-10-CM | POA: Diagnosis not present

## 2016-12-01 DIAGNOSIS — Z6834 Body mass index (BMI) 34.0-34.9, adult: Secondary | ICD-10-CM | POA: Diagnosis not present

## 2016-12-01 DIAGNOSIS — R0602 Shortness of breath: Secondary | ICD-10-CM | POA: Diagnosis not present

## 2016-12-05 ENCOUNTER — Encounter: Payer: Self-pay | Admitting: Cardiology

## 2016-12-05 ENCOUNTER — Ambulatory Visit (INDEPENDENT_AMBULATORY_CARE_PROVIDER_SITE_OTHER): Payer: Medicare Other | Admitting: Cardiology

## 2016-12-05 DIAGNOSIS — R0600 Dyspnea, unspecified: Secondary | ICD-10-CM

## 2016-12-05 DIAGNOSIS — I10 Essential (primary) hypertension: Secondary | ICD-10-CM | POA: Diagnosis not present

## 2016-12-05 DIAGNOSIS — E669 Obesity, unspecified: Secondary | ICD-10-CM

## 2016-12-05 DIAGNOSIS — E785 Hyperlipidemia, unspecified: Secondary | ICD-10-CM | POA: Diagnosis not present

## 2016-12-05 DIAGNOSIS — R0609 Other forms of dyspnea: Secondary | ICD-10-CM

## 2016-12-05 DIAGNOSIS — E119 Type 2 diabetes mellitus without complications: Secondary | ICD-10-CM | POA: Diagnosis not present

## 2016-12-05 DIAGNOSIS — I2510 Atherosclerotic heart disease of native coronary artery without angina pectoris: Secondary | ICD-10-CM

## 2016-12-05 DIAGNOSIS — E66811 Obesity, class 1: Secondary | ICD-10-CM

## 2016-12-05 DIAGNOSIS — Z9861 Coronary angioplasty status: Secondary | ICD-10-CM | POA: Diagnosis not present

## 2016-12-05 NOTE — Assessment & Plan Note (Signed)
On Metformin 

## 2016-12-05 NOTE — Patient Instructions (Signed)
Medication Instructions:   Your physician recommends that you continue on your current medications as directed. Please refer to the Current Medication list given to you today.   If you need a refill on your cardiac medications before your next appointment, please call your pharmacy.  Labwork: NONE ORDERED  TODAY    Testing/Procedures: Your physician has recommended that you have a sleep study. This test records several body functions during sleep, including: brain activity, eye movement, oxygen and carbon dioxide blood levels, heart rate and rhythm, breathing rate and rhythm, the flow of air through your mouth and nose, snoring, body muscle movements, and chest and belly movement.   Your physician has requested that you have en exercise stress myoview. For further information please visit [email or web address] Please follow instruction sheet, as given.   Follow-Up:  4 WEEKS WITH DR [facility]    Any Other Special Instructions Will Be Listed Below (If Applicable).

## 2016-12-05 NOTE — Assessment & Plan Note (Signed)
New problem for him, noted over the past 2 months, also 20 lb wgt gain.

## 2016-12-05 NOTE — Assessment & Plan Note (Signed)
MI-RCA PCI with BMS 2008 Cath '09- patent Cath 2010 in [place], medical Rx (no details)

## 2016-12-05 NOTE — Progress Notes (Signed)
12/05/2016 [name] [name]   04-Feb-1950  [id number]  Primary Physician Benita Stabile, MD Primary Cardiologist: Dr Antoine Poche  HPI:  66 y/o male followed by Dr Antoine Poche with a history of CAD, s/p MI in 2008 treated with an RCA  BMS. He was restudied in 2009 and 2010- medical Rx. He last saw Dr Antoine Poche in Feb 2018 and was doing well. He is in the office today with complaints of DOE and wgt gain for the last 2 months. He had previously been walking everyday, he is retired and [place]. He was doing fairly vigorous walking until recently. He says he now has DOE when he walks a few hundred yards. He denies chest pain or orthopnea. He says he has gained 20 lbs in the last few months. He admits to snoring and poor sleep with frequent awakening.    Current Outpatient Medications  Medication Sig Dispense Refill  . aspirin 81 MG tablet Take 81 mg by mouth daily.      Marland Kitchen atorvastatin (LIPITOR) 40 MG tablet Take 1 tablet (40 mg total) by mouth daily. 90 tablet 3  . lisinopril (PRINIVIL,ZESTRIL) 5 MG tablet Take 1 tablet (5 mg total) by mouth daily. 90 tablet 3  . metFORMIN (GLUCOPHAGE) 500 MG tablet Take 1 tablet (500 mg total) by mouth daily. 90 tablet 3  . metoprolol succinate (TOPROL-XL) 100 MG 24 hr tablet Take 1 tablet (100 mg total) by mouth daily. Take with or immediately following a meal. 90 tablet 3  . nitroGLYCERIN (NITROSTAT) 0.4 MG SL tablet Place 1 tablet (0.4 mg total) under the tongue every 5 (five) minutes as needed. 25 tablet 2   No current facility-administered medications for this visit.     No Known Allergies  Past Medical History:  Diagnosis Date  . Coronary artery disease    S/P stent Summer 2008, January 2016 left main 30% stenosis, LAD mid 50% stenosis, patent stent in the mid right coronary artery with ectasia proximal and distal to the stent. EF mildly reduced at 40% with global hypokinesis.  . Diabetes mellitus (HCC)    A1c 6.1 (11-2008)  . Erectile dysfunction   .  Hyperlipidemia   . Hypertension   . MI (mitral incompetence)     Social History   Socioeconomic History  . Marital status: Married    Spouse name: Not on file  . Number of children: 2  . Years of education: Not on file  . Highest education level: Not on file  Social Needs  . Financial resource strain: Not on file  . Food insecurity - worry: Not on file  . Food insecurity - inability: Not on file  . Transportation needs - medical: Not on file  . Transportation needs - non-medical: Not on file  Occupational History  . Occupation: Event organiser: [employer]  Tobacco Use  . Smoking status: Former Smoker    Last attempt to quit: 01/10/2003    Years since quitting: 13.9  . Smokeless tobacco: Never Used  Substance and Sexual Activity  . Alcohol use: Yes    Comment: rarely  . Drug use: No  . Sexual activity: Not on file  Other Topics Concern  . Not on file  Social History Narrative   06/14/2009 designated party form signed appointing wife Mackson Sena; Ok to leave message on home answering machine at [phone]     Family History  Problem Relation Age of Onset  . Pernicious anemia Maternal  Grandmother   . Coronary artery disease Father [id number]  . Colon cancer Father        ?  . Stroke Mother 5485  . Hypertension Brother   . Stroke Maternal Grandfather   . CAD Brother 5645  . CAD Brother 50     Review of Systems: General: negative for chills, fever, night sweats or weight changes.  Cardiovascular: negative for chest pain, dyspnea on exertion, edema, orthopnea, palpitations, paroxysmal nocturnal dyspnea or shortness of breath Dermatological: negative for rash Respiratory: negative for cough or wheezing Urologic: negative for hematuria Abdominal: negative for nausea, vomiting, diarrhea, bright red blood per rectum, melena, or hematemesis Neurologic: negative for visual changes, syncope, or dizziness All other systems reviewed and are otherwise negative except as  noted above.    Blood pressure 119/73, pulse 62, height 6' (1.829 m), weight 257 lb (116.6 kg).  General appearance: alert, cooperative, no distress and moderately obese Neck: no carotid bruit and no JVD Lungs: clear to auscultation bilaterally Heart: regular rate and rhythm Abdomen: obese, non tender Extremities: extremities normal, atraumatic, no cyanosis or edema Pulses: 2+ and symmetric Skin: Skin color, texture, turgor normal. No rashes or lesions Neurologic: Grossly normal  EKG [id number]  ASSESSMENT AND PLAN:   DOE (dyspnea on exertion) New problem for him, noted over the past 2 months, also 20 lb wgt gain.  Non-insulin treated type 2 diabetes mellitus (HCC) On Metformin  Essential hypertension Controlled  Obesity (BMI 30.0-34.9) BMI 34, snores, daytime fatigue, poor sleep pattern-R/O sleep apnea  CAD S/P percutaneous coronary angioplasty MI-RCA PCI with BMS 2008 Cath '09- patent Cath 2010 [place], medical Rx (no details)   PLAN  I suggested we proceed with an exercise Myoview to r/o anginal equivalent. He just had an echo in April 2018 and does not appear to be in CHF on exam. He just had labs drawn at Dr Scharlene GlossHall's office Friday. I have requested those results. He'll need to have a CBC, BMP, TSH, and BNP- I'll wait and review Dr Scharlene GlossHall's lab. I'll also arrange for a sleep study.    Corine ShelterLuke Orlander Norwood PA-C 12/05/2016 2:52 PM

## 2016-12-05 NOTE — Assessment & Plan Note (Signed)
BMI 34, snores, daytime fatigue, poor sleep pattern-R/O sleep apnea

## 2016-12-05 NOTE — Assessment & Plan Note (Signed)
Controlled.  

## 2016-12-08 NOTE — Addendum Note (Signed)
Addended by: Chana Bode on: 12/08/2016 04:30 PM   Modules accepted: Orders

## 2016-12-12 ENCOUNTER — Telehealth (HOSPITAL_COMMUNITY): Payer: Self-pay

## 2016-12-12 NOTE — Telephone Encounter (Signed)
Encounter complete. 

## 2016-12-14 ENCOUNTER — Inpatient Hospital Stay (HOSPITAL_COMMUNITY)
Admission: RE | Admit: 2016-12-14 | Payer: Medicare Other | Source: Ambulatory Visit | Attending: Cardiology | Admitting: Cardiology

## 2016-12-31 ENCOUNTER — Other Ambulatory Visit: Payer: Self-pay | Admitting: Cardiology

## 2017-01-01 NOTE — Telephone Encounter (Signed)
REFILL 

## 2017-01-11 ENCOUNTER — Telehealth (HOSPITAL_COMMUNITY): Payer: Self-pay

## 2017-01-11 ENCOUNTER — Ambulatory Visit (HOSPITAL_BASED_OUTPATIENT_CLINIC_OR_DEPARTMENT_OTHER): Payer: Medicare Other | Attending: Cardiology | Admitting: Cardiovascular Disease

## 2017-01-11 VITALS — Ht 72.0 in | Wt 250.0 lb

## 2017-01-11 DIAGNOSIS — G4733 Obstructive sleep apnea (adult) (pediatric): Secondary | ICD-10-CM | POA: Diagnosis not present

## 2017-01-11 DIAGNOSIS — G4736 Sleep related hypoventilation in conditions classified elsewhere: Secondary | ICD-10-CM | POA: Insufficient documentation

## 2017-01-11 DIAGNOSIS — R0609 Other forms of dyspnea: Secondary | ICD-10-CM

## 2017-01-11 DIAGNOSIS — E669 Obesity, unspecified: Secondary | ICD-10-CM

## 2017-01-11 NOTE — Telephone Encounter (Signed)
Encounter Complete.  

## 2017-01-16 ENCOUNTER — Ambulatory Visit (HOSPITAL_COMMUNITY)
Admission: RE | Admit: 2017-01-16 | Discharge: 2017-01-16 | Disposition: A | Discharge status: Home / Self Care | Payer: Medicare Other | Source: Ambulatory Visit | Attending: Cardiovascular Disease | Admitting: Cardiovascular Disease

## 2017-01-16 DIAGNOSIS — R0609 Other forms of dyspnea: Secondary | ICD-10-CM | POA: Insufficient documentation

## 2017-01-16 LAB — MYOCARDIAL PERFUSION IMAGING
Estimated workload: 7 METS
Exercise duration (min): 5 min
Exercise duration (sec): 27 s
LV dias vol: 136 mL (ref 62–150)
LV sys vol: 67 mL
MPHR: 155 {beats}/min
Peak HR: 151 {beats}/min
Percent HR: 97 %
RPE: 18
Rest HR: 60 {beats}/min
SDS: 4
SRS: 1
SSS: 5
TID: 1.28

## 2017-01-16 MED ORDER — TECHNETIUM TC 99M TETROFOSMIN IV KIT
32.0000 | PACK | Freq: Once | INTRAVENOUS | Status: AC | PRN
  Administered 2017-01-16: 32 via INTRAVENOUS
  Filled 2017-01-16: qty 32

## 2017-01-16 MED ORDER — TECHNETIUM TC 99M TETROFOSMIN IV KIT
10.2000 | PACK | Freq: Once | INTRAVENOUS | Status: AC | PRN
  Administered 2017-01-16: 10.2 via INTRAVENOUS
  Filled 2017-01-16: qty 11

## 2017-01-21 NOTE — Progress Notes (Deleted)
Cardiology Office Note   Date:  01/21/2017   ID:  [name] [name], DOB [birth year], MRN [id number]  PCP:  Benita Stabile, MD  Cardiologist:   Rollene Rotunda, MD   No chief complaint on file.     History of Present Illness: [name] [name] is a 67 y.o. male who presents for evaluation of coronary disease.Marland Kitchen He had a previous infarct with stenting of his circumflex coronary artery in the past. IN 2010 catheterization in our system as well. He has some chest discomfort in 2016 and had a cath in [place] and was said to have had a non-Q-wave myocardial infarction. He was found to have a patent stent and nonobstructive disease as described below. Of note his ejection fraction was mildly reduced. He had an EF of 45% global hypokinesis.  When I saw him in Feb of last year he was doing well but in Nov he had increased SOB and saw [employer] [facility].  He had a [employer].  He had an EF of 51% with a medium defect of moderate severity in the basal inferior, mid inferior and apical inferior location.   He returns to discuss this.  ***    The left ventricular ejection fraction is mildly decreased (45-54%).  Nuclear stress EF: 51%.  There was no ST segment deviation noted during stress.  Defect 1: There is a medium defect of moderate severity present in the basal inferior, mid inferior and apical inferior location.  Findings consistent with ischemia.  This is an intermediate risk study.      At the last visit I continued his DAPT.  He returns for follow up.  He moved and has been doing lots of work on new property including cutting trees.  The patient denies any new symptoms such as chest discomfort, neck or arm discomfort. There has been no new shortness of breath, PND or orthopnea. There have been no reported palpitations, presyncope or syncope.  Past Medical History:  Diagnosis Date  . Coronary artery disease    S/P stent Summer 2008, January 2016 left main 30% stenosis, LAD mid 50%  stenosis, patent stent in the mid right coronary artery with ectasia proximal and distal to the stent. EF mildly reduced at 40% with global hypokinesis.  . Diabetes mellitus (HCC)    A1c 6.1 (11-2008)  . Erectile dysfunction   . Hyperlipidemia   . Hypertension   . MI (mitral incompetence)     Past Surgical History:  Procedure Laterality Date  . COLONOSCOPY     negative  . WRIST SURGERY     fractured wrist, Dr. Amanda Pea     Current Outpatient Medications  Medication Sig Dispense Refill  . aspirin 81 MG tablet Take 81 mg by mouth daily.      Marland Kitchen atorvastatin (LIPITOR) 40 MG tablet TAKE 1 TABLET BY MOUTH ONCE DAILY 90 tablet 2  . lisinopril (PRINIVIL,ZESTRIL) 5 MG tablet Take 1 tablet (5 mg total) by mouth daily. 90 tablet 3  . metFORMIN (GLUCOPHAGE) 500 MG tablet Take 1 tablet (500 mg total) by mouth daily. 90 tablet 3  . metoprolol succinate (TOPROL-XL) 100 MG 24 hr tablet Take 1 tablet (100 mg total) by mouth daily. Take with or immediately following a meal. 90 tablet 3  . nitroGLYCERIN (NITROSTAT) 0.4 MG SL tablet Place 1 tablet (0.4 mg total) under the tongue every 5 (five) minutes as needed. 25 tablet 2   No current facility-administered medications for this visit.  Allergies:   Patient has no known allergies.    ROS:  Please see the history of present illness.   Otherwise, review of systems are positive for ***.   All other systems are reviewed and negative.   PHYSICAL EXAM: VS:  There were no vitals taken for this visit. , BMI There is no height or weight on file to calculate BMI.  GENERAL:  Well appearing NECK:  No jugular venous distention, waveform within normal limits, carotid upstroke brisk and symmetric, no bruits, no thyromegaly LUNGS:  Clear to auscultation bilaterally CHEST:  Unremarkable HEART:  PMI not displaced or sustained,S1 and S2 within normal limits, no S3, no S4, no clicks, no rubs, *** murmurs ABD:  Flat, positive bowel sounds normal in frequency in  pitch, no bruits, no rebound, no guarding, no midline pulsatile mass, no hepatomegaly, no splenomegaly EXT:  2 plus pulses throughout, no edema, no cyanosis no clubbing   GENERAL:  Well appearing NECK:  No jugular venous distention, waveform within normal limits, carotid upstroke brisk and symmetric, no bruits, no thyromegaly LYMPHATICS:  No cervical, inguinal adenopathy LUNGS:  Clear to auscultation bilaterally BACK:  No CVA tenderness CHEST:  Unremarkable HEART:  PMI not displaced or sustained,S1 and S2 within normal limits, no S3, no S4, no clicks, no rubs, no murmurs ABD:  Flat, positive bowel sounds normal in frequency in pitch, no bruits, no rebound, no guarding, no midline pulsatile mass, no hepatomegaly, no splenomegaly EXT:  2 plus pulses throughout, no edema, no cyanosis no clubbing   EKG:  EKG is  *** ordered today. The ekg ordered today demonstrates normal sinus rhythm, rate ***, axis within normal limits, intervals within normal limits, no acute ST-T wave changes.   Recent Labs: 04/11/2016: BUN 12; Creatinine, Ser 1.24; Hemoglobin 14.7; Platelets 216; Potassium 4.4; Sodium 139    Lipid Panel    Component Value Date/Time   CHOL 130 04/11/2016 0921   TRIG 122 04/11/2016 0921   HDL 37 (L) 04/11/2016 0921   CHOLHDL 3.5 04/11/2016 0921   CHOLHDL 4 08/22/2010 1101   VLDL 24.8 08/22/2010 1101   LDLCALC 69 04/11/2016 0921      Wt Readings from Last 3 Encounters:  01/16/17 257 lb (116.6 kg)  01/11/17 250 lb (113.4 kg)  12/05/16 257 lb (116.6 kg)    Lab Results  Component Value Date   HGBA1C 6.1 (H) 04/11/2016    Other studies Reviewed: Additional studies/ records that were reviewed today include:  *** Review of the above records demonstrates:  ***   ASSESSMENT AND PLAN:  CAD:  ***  The patient has no new sypmtoms.  No further cardiovascular testing is indicated.  We will continue with aggressive risk reduction and meds as listed.  OVERWEIGHT:  *** The patient  understands the need to lose weight with diet and exercise. We have discussed specific strategies for this.  REDUCED EF:  His EF is *** mildly reduced. I will follow up with an echo this year.  DYSLIPIDEMIA:  LDL was at target. ***   I will check a fasting lipid profile and CMET.  DM:  His last A1c is as above. ***   has no primary at this point.  I will check an A1c   Current medicines are reviewed at length with the patient today.  The patient has concerns regarding medicines.  The following changes have been made:  ***  Labs/ tests ordered today include:   ***    Disposition:  FU with me in *** months    Signed, Rollene Rotunda, MD  01/21/2017 2:29 PM    [facility]

## 2017-01-22 ENCOUNTER — Ambulatory Visit: Payer: Medicare Other | Admitting: Cardiology

## 2017-02-02 ENCOUNTER — Encounter (HOSPITAL_BASED_OUTPATIENT_CLINIC_OR_DEPARTMENT_OTHER): Payer: Self-pay | Admitting: Cardiovascular Disease

## 2017-02-02 NOTE — Procedures (Signed)
Patient Name: [name] [name], [name] [name] Date: 01/11/2017 Gender: Male D.O.B: [birth year] Age (years): 26 Referring Provider: Corine Shelter Height (inches): 70 Interpreting Physician: Nicki Guadalajara MD, ABSM Weight (lbs): 250 RPSGT: Cherylann Parr BMI: 36 MRN: [id number] Neck Size: 18.00  CLINICAL INFORMATION Sleep Study Type: NPSG  Indication for sleep study: Fatigue, Hypertension, Obesity, OSA, Snoring, Witnessed Apneas  Epworth Sleepiness Score: 3  SLEEP STUDY TECHNIQUE As per the AASM Manual for the Scoring of Sleep and Associated Events v2.3 (April 2016) with a hypopnea requiring 4% desaturations.  The channels recorded and monitored were frontal, central and occipital EEG, electrooculogram (EOG), submentalis EMG (chin), nasal and oral airflow, thoracic and abdominal wall motion, anterior tibialis EMG, snore microphone, electrocardiogram, and pulse oximetry.  MEDICATIONS     aspirin 81 MG tablet         atorvastatin (LIPITOR) 40 MG tablet         lisinopril (PRINIVIL,ZESTRIL) 5 MG tablet         metFORMIN (GLUCOPHAGE) 500 MG tablet         metoprolol succinate (TOPROL-XL) 100 MG 24 hr tablet         nitroGLYCERIN (NITROSTAT) 0.4 MG SL tablet      Medications self-administered by patient taken the night of the study : N/A  SLEEP ARCHITECTURE The study was initiated at 10:04:37 PM and ended at 4:44:10 AM.  Sleep onset time was 39.7 minutes and the sleep efficiency was 76.9%. The total sleep time was 307.4 minutes. Wake after sleep onset (WASO) was 52.5 minutes.  Stage REM latency was 250.5 minutes.  The patient spent 13.50% of the night in stage N1 sleep, 74.62% in stage N2 sleep, 0.00% in stage N3 and 11.88% in REM.  Alpha intrusion was absent.  Supine sleep was 6.51%.  RESPIRATORY PARAMETERS The overall apnea/hypopnea index (AHI) was 34.2 per hour. There were 64 total apneas, including 62 obstructive, 2 central and 0 mixed apneas. There were 111 hypopneas and 1  RERAs.  The AHI during Stage REM sleep was 31.2 per hour.  AHI while supine was 105.0 per hour.  The mean oxygen saturation was 91.74%. The minimum SpO2 during sleep was 80.00%.  Loud snoring was noted during this study.  CARDIAC DATA The 2 lead EKG demonstrated sinus rhythm. The mean heart rate was 60.24 beats per minute. Other EKG findings include: None.  LEG MOVEMENT DATA The total PLMS were 0 with a resulting PLMS index of 0.00. Associated arousal with leg movement index was 0.0 .  IMPRESSIONS - Severe obstructive sleep apnea with an AHI of 34.2/h. - No significant central sleep apnea occurred during this study (CAI = 0.4/h). - Moderate oxygen desaturation to a nadir of 80%. - Abnormal sleep architecture with absence of slow wave sleep and proloinged latency to REM sleep. - Significant positional component with AHI during supine sleep 105/h. - The patient snored with loud snoring volume. - No cardiac abnormalities were noted during this study. - Clinically significant periodic limb movements did not occur during sleep. No significant associated arousals.  DIAGNOSIS - Obstructive Sleep Apnea (327.23 [G47.33 ICD-10]) - Nocturnal Hypoxemia (327.26 [G47.36 ICD-10])  RECOMMENDATIONS - Therapeutic CPAP titration to determine optimal pressure required to alleviate sleep disordered breathing. - Efforts should be made to  optimize nasal and oropharyngeal patency.  - The patient should be counseled to avoid supine position and consider ositional therapy avoiding supine position during sleep. - Avoid alcohol, sedatives and other CNS depressants that may worsen sleep apnea  and disrupt normal sleep architecture. - Sleep hygiene should be reviewed to assess factors that may improve sleep quality. - Weight management (BMI 36) and regular exercise should be initiated.  [Electronically signed] 02/02/2017 10:15 AM  Nicki Guadalajara MD, [facility], ABSM Diplomate, American Board of Sleep  Medicine   NPI: [id number]  [facility] PH: [phone]   FX: [phone] ACCREDITED BY THE AMERICAN ACADEMY OF SLEEP MEDICINE

## 2017-02-07 ENCOUNTER — Telehealth: Payer: Self-pay | Admitting: *Deleted

## 2017-02-07 DIAGNOSIS — G4733 Obstructive sleep apnea (adult) (pediatric): Secondary | ICD-10-CM

## 2017-02-07 NOTE — Telephone Encounter (Addendum)
-----   Message from Lennette Bihari, MD sent at 02/02/2017 10:21 AM EST ----- Hayley; please notify the pt the results and schedule for CPAP titraton study.   Left message for pt to call, order placed and sent to pre-cert

## 2017-02-07 NOTE — Telephone Encounter (Signed)
Spoke to patient-aware of results and recommendations.   Verbalized understanding.  Aware once CPAP titration is approved, someone will call to get this scheduled.

## 2017-02-08 NOTE — Progress Notes (Signed)
Cardiology Office Note   Date:  02/09/2017   ID:  [name] [name], DOB 11-01-1950, MRN [id number]  PCP:  Benita Stabile, MD  Cardiologist:   Rollene Rotunda, MD   Chief Complaint  Patient presents with  . Coronary Artery Disease      History of Present Illness: [name] [name] is a 67 y.o. male who presents for evaluation of coronary disease.Marland Kitchen He had a previous infarct with stenting of his RCA in the past.  He has some chest discomfort in 2016 and had a cath in [place] and I was able to review this today for this appointment.  He had a previous stent in his right coronary that was widely patent and no significant disease in his circumflex.  He had LAD 50% stenosis.  He did have some diffuse hypokinesis.  When I saw him in Feb of last year he was doing well but in Nov he had increased SOB and saw [employer] [facility].  He had a [employer].  He had an EF of 51% with a medium defect of moderate severity in the basal inferior, mid inferior and apical inferior location.   He returns to discuss this.    He says that things started when he started getting short of breath with usual activities such as walking with a friend.  He subsequently had to stop doing this.  Despite not eating much he is gained about 40 pounds in 8-9 months.  He denies any abdominal distention or  swelling.  He is not having any new chest pressure, neck or arm discomfort.  His predominant symptom is dyspnea.      The left ventricular ejection fraction is mildly decreased (45-54%).  Nuclear stress EF: 51%.  There was no ST segment deviation noted during stress.  Defect 1: There is a medium defect of moderate severity present in the basal inferior, mid inferior and apical inferior location.  Findings consistent with ischemia.  This is an intermediate risk study.      At the last visit I continued his DAPT.  He returns for follow up.  He moved and has been doing lots of work on new property including cutting trees.   The patient denies any new symptoms such as chest discomfort, neck or arm discomfort. There has been no new shortness of breath, PND or orthopnea. There have been no reported palpitations, presyncope or syncope.  Past Medical History:  Diagnosis Date  . Coronary artery disease    S/P stent Summer 2008, January 2016 left main 30% stenosis, LAD mid 50% stenosis, patent stent in the mid right coronary artery with ectasia proximal and distal to the stent. EF mildly reduced at 40% with global hypokinesis.  . Diabetes mellitus (HCC)    A1c 6.1 (11-2008)  . Erectile dysfunction   . Hyperlipidemia   . Hypertension   . MI (mitral incompetence)     Past Surgical History:  Procedure Laterality Date  . COLONOSCOPY     negative  . WRIST SURGERY     fractured wrist, Dr. Amanda Pea     Current Outpatient Medications  Medication Sig Dispense Refill  . aspirin 81 MG tablet Take 81 mg by mouth daily.      Marland Kitchen atorvastatin (LIPITOR) 40 MG tablet TAKE 1 TABLET BY MOUTH ONCE DAILY 90 tablet 2  . lisinopril (PRINIVIL,ZESTRIL) 5 MG tablet Take 1 tablet (5 mg total) by mouth daily. 90 tablet 3  . metFORMIN (GLUCOPHAGE) 500 MG tablet Take  1 tablet (500 mg total) by mouth daily. 90 tablet 3  . metoprolol succinate (TOPROL-XL) 100 MG 24 hr tablet Take 1 tablet (100 mg total) by mouth daily. Take with or immediately following a meal. 90 tablet 3  . nitroGLYCERIN (NITROSTAT) 0.4 MG SL tablet Place 1 tablet (0.4 mg total) under the tongue every 5 (five) minutes as needed. 25 tablet 2   No current facility-administered medications for this visit.     Allergies:   Patient has no known allergies.    ROS:  Please see the history of present illness.   Otherwise, review of systems are positive for none.   All other systems are reviewed and negative.   PHYSICAL EXAM: VS:  BP 122/76   Pulse 64   Ht 6' (1.829 m)   Wt 263 lb 6.4 oz (119.5 kg)   BMI 35.72 kg/m  , BMI Body mass index is 35.72 kg/m.  GENERAL:  Well  appearing NECK:  No jugular venous distention, waveform within normal limits, carotid upstroke brisk and symmetric, no bruits, no thyromegaly LUNGS:  Clear to auscultation bilaterally CHEST:  Unremarkable HEART:  PMI not displaced or sustained,S1 and S2 within normal limits, no S3, no S4, no clicks, no rubs, no murmurs ABD:  Flat, positive bowel sounds normal in frequency in pitch, no bruits, no rebound, no guarding, no midline pulsatile mass, no hepatomegaly, no splenomegaly EXT:  2 plus pulses throughout, trace edema, no cyanosis no clubbing   EKG:  EKG is  not ordered today. The ekg ordered 12/05/16 demonstrates normal sinus rhythm, rate 63, axis within normal limits, intervals within normal limits, no acute ST-T wave changes.   Recent Labs: 04/11/2016: BUN 12; Creatinine, Ser 1.24; Hemoglobin 14.7; Platelets 216; Potassium 4.4; Sodium 139    Lipid Panel    Component Value Date/Time   CHOL 130 04/11/2016 0921   TRIG 122 04/11/2016 0921   HDL 37 (L) 04/11/2016 0921   CHOLHDL 3.5 04/11/2016 0921   CHOLHDL 4 08/22/2010 1101   VLDL 24.8 08/22/2010 1101   LDLCALC 69 04/11/2016 0921      Wt Readings from Last 3 Encounters:  02/09/17 263 lb 6.4 oz (119.5 kg)  01/16/17 257 lb (116.6 kg)  01/11/17 250 lb (113.4 kg)    Lab Results  Component Value Date   HGBA1C 6.1 (H) 04/11/2016    Other studies Reviewed: Additional studies/ records that were reviewed today include:  Lexiscan Myoview. Review of the above records demonstrates:     ASSESSMENT AND PLAN:  CAD: Given the increasing shortness of breath with activity that is otherwise unexplained in the inferior wall defect which may represent old damage but could represent new obstructive coronary disease cardiac cath is indicated.  The patient understands that risks included but are not limited to stroke (1 in 1000), death (1 in 1000), kidney failure [usually temporary] (1 in 500), bleeding (1 in 200), allergic reaction [possibly  serious] (1 in 200).  The patient understands and agrees to proceed.   OVERWEIGHT: We would discuss this further based on the results of the findings above.    REDUCED EF:  His EF is mildly reduced but stable and I will follow up with this.   DYSLIPIDEMIA:  LDL was at target. He will continue meds as listed.   DM:  His last A1c is as above. He will continue meds as listed .  SLEEP APNEA:  The patient has been diagnosed with sleep apnea and is to be set up  for CPAP.     Current medicines are reviewed at length with the patient today.  The patient has concerns regarding medicines.  The following changes have been made:  None  Labs/ tests ordered today include:   Cardiac cath.      Disposition:   FU with me after the cath.  Signed, Rollene Rotunda, MD  02/09/2017 1:16 PM    [facility]

## 2017-02-08 NOTE — H&P (View-Only) (Signed)
Cardiology Office Note   Date:  02/09/2017   ID:  [name] [name], DOB 11-01-1950, MRN [id number]  PCP:  Benita Stabile, MD  Cardiologist:   Rollene Rotunda, MD   Chief Complaint  Patient presents with  . Coronary Artery Disease      History of Present Illness: [name] [name] is a 67 y.o. male who presents for evaluation of coronary disease.Marland Kitchen He had a previous infarct with stenting of his RCA in the past.  He has some chest discomfort in 2016 and had a cath in [place] and I was able to review this today for this appointment.  He had a previous stent in his right coronary that was widely patent and no significant disease in his circumflex.  He had LAD 50% stenosis.  He did have some diffuse hypokinesis.  When I saw him in Feb of last year he was doing well but in Nov he had increased SOB and saw [employer] [facility].  He had a [employer].  He had an EF of 51% with a medium defect of moderate severity in the basal inferior, mid inferior and apical inferior location.   He returns to discuss this.    He says that things started when he started getting short of breath with usual activities such as walking with a friend.  He subsequently had to stop doing this.  Despite not eating much he is gained about 40 pounds in 8-9 months.  He denies any abdominal distention or  swelling.  He is not having any new chest pressure, neck or arm discomfort.  His predominant symptom is dyspnea.      The left ventricular ejection fraction is mildly decreased (45-54%).  Nuclear stress EF: 51%.  There was no ST segment deviation noted during stress.  Defect 1: There is a medium defect of moderate severity present in the basal inferior, mid inferior and apical inferior location.  Findings consistent with ischemia.  This is an intermediate risk study.      At the last visit I continued his DAPT.  He returns for follow up.  He moved and has been doing lots of work on new property including cutting trees.   The patient denies any new symptoms such as chest discomfort, neck or arm discomfort. There has been no new shortness of breath, PND or orthopnea. There have been no reported palpitations, presyncope or syncope.  Past Medical History:  Diagnosis Date  . Coronary artery disease    S/P stent Summer 2008, January 2016 left main 30% stenosis, LAD mid 50% stenosis, patent stent in the mid right coronary artery with ectasia proximal and distal to the stent. EF mildly reduced at 40% with global hypokinesis.  . Diabetes mellitus (HCC)    A1c 6.1 (11-2008)  . Erectile dysfunction   . Hyperlipidemia   . Hypertension   . MI (mitral incompetence)     Past Surgical History:  Procedure Laterality Date  . COLONOSCOPY     negative  . WRIST SURGERY     fractured wrist, Dr. Amanda Pea     Current Outpatient Medications  Medication Sig Dispense Refill  . aspirin 81 MG tablet Take 81 mg by mouth daily.      Marland Kitchen atorvastatin (LIPITOR) 40 MG tablet TAKE 1 TABLET BY MOUTH ONCE DAILY 90 tablet 2  . lisinopril (PRINIVIL,ZESTRIL) 5 MG tablet Take 1 tablet (5 mg total) by mouth daily. 90 tablet 3  . metFORMIN (GLUCOPHAGE) 500 MG tablet Take  1 tablet (500 mg total) by mouth daily. 90 tablet 3  . metoprolol succinate (TOPROL-XL) 100 MG 24 hr tablet Take 1 tablet (100 mg total) by mouth daily. Take with or immediately following a meal. 90 tablet 3  . nitroGLYCERIN (NITROSTAT) 0.4 MG SL tablet Place 1 tablet (0.4 mg total) under the tongue every 5 (five) minutes as needed. 25 tablet 2   No current facility-administered medications for this visit.     Allergies:   Patient has no known allergies.    ROS:  Please see the history of present illness.   Otherwise, review of systems are positive for none.   All other systems are reviewed and negative.   PHYSICAL EXAM: VS:  BP 122/76   Pulse 64   Ht 6' (1.829 m)   Wt 263 lb 6.4 oz (119.5 kg)   BMI 35.72 kg/m  , BMI Body mass index is 35.72 kg/m.  GENERAL:  Well  appearing NECK:  No jugular venous distention, waveform within normal limits, carotid upstroke brisk and symmetric, no bruits, no thyromegaly LUNGS:  Clear to auscultation bilaterally CHEST:  Unremarkable HEART:  PMI not displaced or sustained,S1 and S2 within normal limits, no S3, no S4, no clicks, no rubs, no murmurs ABD:  Flat, positive bowel sounds normal in frequency in pitch, no bruits, no rebound, no guarding, no midline pulsatile mass, no hepatomegaly, no splenomegaly EXT:  2 plus pulses throughout, trace edema, no cyanosis no clubbing   EKG:  EKG is  not ordered today. The ekg ordered 12/05/16 demonstrates normal sinus rhythm, rate 63, axis within normal limits, intervals within normal limits, no acute ST-T wave changes.   Recent Labs: 04/11/2016: BUN 12; Creatinine, Ser 1.24; Hemoglobin 14.7; Platelets 216; Potassium 4.4; Sodium 139    Lipid Panel    Component Value Date/Time   CHOL 130 04/11/2016 0921   TRIG 122 04/11/2016 0921   HDL 37 (L) 04/11/2016 0921   CHOLHDL 3.5 04/11/2016 0921   CHOLHDL 4 08/22/2010 1101   VLDL 24.8 08/22/2010 1101   LDLCALC 69 04/11/2016 0921      Wt Readings from Last 3 Encounters:  02/09/17 263 lb 6.4 oz (119.5 kg)  01/16/17 257 lb (116.6 kg)  01/11/17 250 lb (113.4 kg)    Lab Results  Component Value Date   HGBA1C 6.1 (H) 04/11/2016    Other studies Reviewed: Additional studies/ records that were reviewed today include:  Lexiscan Myoview. Review of the above records demonstrates:     ASSESSMENT AND PLAN:  CAD: Given the increasing shortness of breath with activity that is otherwise unexplained in the inferior wall defect which may represent old damage but could represent new obstructive coronary disease cardiac cath is indicated.  The patient understands that risks included but are not limited to stroke (1 in 1000), death (1 in 1000), kidney failure [usually temporary] (1 in 500), bleeding (1 in 200), allergic reaction [possibly  serious] (1 in 200).  The patient understands and agrees to proceed.   OVERWEIGHT: We would discuss this further based on the results of the findings above.    REDUCED EF:  His EF is mildly reduced but stable and I will follow up with this.   DYSLIPIDEMIA:  LDL was at target. He will continue meds as listed.   DM:  His last A1c is as above. He will continue meds as listed .  SLEEP APNEA:  The patient has been diagnosed with sleep apnea and is to be set up  for CPAP.     Current medicines are reviewed at length with the patient today.  The patient has concerns regarding medicines.  The following changes have been made:  None  Labs/ tests ordered today include:   Cardiac cath.      Disposition:   FU with me after the cath.  Signed, Rollene Rotunda, MD  02/09/2017 1:16 PM    [facility]

## 2017-02-09 ENCOUNTER — Ambulatory Visit (INDEPENDENT_AMBULATORY_CARE_PROVIDER_SITE_OTHER): Payer: Medicare Other | Admitting: Cardiology

## 2017-02-09 ENCOUNTER — Encounter: Payer: Self-pay | Admitting: Cardiology

## 2017-02-09 VITALS — BP 122/76 | HR 64 | Ht 72.0 in | Wt 263.4 lb

## 2017-02-09 DIAGNOSIS — R5383 Other fatigue: Secondary | ICD-10-CM | POA: Diagnosis not present

## 2017-02-09 DIAGNOSIS — Z01812 Encounter for preprocedural laboratory examination: Secondary | ICD-10-CM

## 2017-02-09 DIAGNOSIS — I209 Angina pectoris, unspecified: Secondary | ICD-10-CM

## 2017-02-09 DIAGNOSIS — I25119 Atherosclerotic heart disease of native coronary artery with unspecified angina pectoris: Secondary | ICD-10-CM

## 2017-02-09 DIAGNOSIS — R9439 Abnormal result of other cardiovascular function study: Secondary | ICD-10-CM | POA: Diagnosis not present

## 2017-02-09 NOTE — Patient Instructions (Signed)
Medication Instructions:  Continue current medications  If you need a refill on your cardiac medications before your next appointment, please call your pharmacy.  Labwork: Pre Op Labs HERE IN OUR OFFICE AT [facility]  Take the provided lab slips for you to take with you to the lab for you blood draw.   You will NOT need to fast   You may go to any [facility] lab that is convenient for you however, we do have a lab in our office that is able to assist you. You do NOT need an appointment for our lab. Once in our office lobby there is a podium to the right of the check-in desk where you are to sign-in and ring a doorbell to alert [place] you are here. Lab is open Monday-Friday from 8:00am to 4:00pm; and is closed for lunch from 12:45p-1:45pm   Testing/Procedures: Your physician has requested that you have a cardiac catheterization. Cardiac catheterization is used to diagnose and/or treat various heart conditions. Doctors may recommend this procedure for a number of different reasons. The most common reason is to evaluate chest pain. Chest pain can be a symptom of coronary artery disease (CAD), and cardiac catheterization can show whether plaque is narrowing or blocking your heart's arteries. This procedure is also used to evaluate the valves, as well as measure the blood flow and oxygen levels in different parts of your heart. For further information please visit [email or web address] Please follow instruction sheet, as given.   Special Instructions:   [facility] CARDIOVASCULAR DIVISION [facility] [address] [place] [address] Dept: [phone] Loc: [phone]  [name] [name]  02/09/2017  You are scheduled for a Cardiac Catheterization on Wednesday, February 6 with Dr. Nicki Guadalajara.  1. Please arrive at the [facility] (Main Entrance A) at [facility]: [address] [place], [place] [address] at 9:30 AM (two hours before your  procedure to ensure your preparation). Free valet parking service is available.   Special note: Every effort is made to have your procedure done on time. Please understand that emergencies sometimes delay scheduled procedures.  2. Diet: Do not eat or drink anything after midnight prior to your procedure except sips of water to take medications.  3. Labs: Today  4. Medication instructions in preparation for your procedure:   Current Outpatient Medications (Endocrine & Metabolic):  .  metFORMIN (GLUCOPHAGE) 500 MG tablet, Take 1 tablet (500 mg total) by mouth daily.  Current Outpatient Medications (Cardiovascular):  .  atorvastatin (LIPITOR) 40 MG tablet, TAKE 1 TABLET BY MOUTH ONCE DAILY .  lisinopril (PRINIVIL,ZESTRIL) 5 MG tablet, Take 1 tablet (5 mg total) by mouth daily. .  metoprolol succinate (TOPROL-XL) 100 MG 24 hr tablet, Take 1 tablet (100 mg total) by mouth daily. Take with or immediately following a meal. .  nitroGLYCERIN (NITROSTAT) 0.4 MG SL tablet, Place 1 tablet (0.4 mg total) under the tongue every 5 (five) minutes as needed.   Current Outpatient Medications (Analgesics):  .  aspirin 81 MG tablet, Take 81 mg by mouth daily.     *For reference purposes while preparing patient instructions.   Delete this med list prior to printing instructions for patient.*  None   Stop taking, Glucophage (Metformin) on Tuesday, February 5.    None  On the morning of your procedure, take your Aspirin and any morning medicines NOT listed above.  You may use sips of water.  5. Plan for one night stay--bring  personal belongings. 6. Bring a current list of your medications and current insurance cards. 7. You MUST have a responsible person to drive you home. 8. Someone MUST be with you the first 24 hours after you arrive home or your discharge will be delayed. 9. Please wear clothes that are easy to get on and off and wear slip-on shoes.  Thank you for allowing [place] to care for you!    -- [facility]   Follow-Up: Your physician wants you to follow-up in: After Cath.     Thank you for choosing [facility] at [facility]!!

## 2017-02-10 LAB — CBC
HEMOGLOBIN: 14 g/dL (ref 13.0–17.7)
Hematocrit: 42.5 % (ref 37.5–51.0)
MCH: 28.8 pg (ref 26.6–33.0)
MCHC: 32.9 g/dL (ref 31.5–35.7)
MCV: 87 fL (ref 79–97)
PLATELETS: 221 10*3/uL (ref 150–379)
RBC: 4.86 x10E6/uL (ref 4.14–5.80)
RDW: 15.3 % (ref 12.3–15.4)
WBC: 6 10*3/uL (ref 3.4–10.8)

## 2017-02-10 LAB — BASIC METABOLIC PANEL
BUN/Creatinine Ratio: 13 (ref 10–24)
BUN: 17 mg/dL (ref 8–27)
CALCIUM: 9.2 mg/dL (ref 8.6–10.2)
CO2: 24 mmol/L (ref 20–29)
CREATININE: 1.3 mg/dL — AB (ref 0.76–1.27)
Chloride: 100 mmol/L (ref 96–106)
GFR calc Af Amer: 66 mL/min/{1.73_m2} (ref >59)
GFR calc non Af Amer: 57 mL/min/{1.73_m2} — ABNORMAL LOW (ref >59)
Glucose: 91 mg/dL (ref 65–99)
Potassium: 4.6 mmol/L (ref 3.5–5.2)
Sodium: 140 mmol/L (ref 134–144)

## 2017-02-10 LAB — TSH: TSH: 4.01 u[IU]/mL (ref 0.450–4.500)

## 2017-02-12 ENCOUNTER — Telehealth: Payer: Self-pay | Admitting: *Deleted

## 2017-02-12 NOTE — Telephone Encounter (Signed)
Patient contacted pre-catheterization at [facility] scheduled for: Wednesday February 6,2019 11:30 AM Verified arrival time and place:  [facility] 9 AM. Verified no known allergies. Verified nothing to eat or drink after midnight.   Confirmed HOLD medications: Metformin -none 02/13/17, 02/14/17, and 48 hours after procedure.   Confirmed all othe AM meds to be taken pre-cath with sip of water including: ASA 81 mg. Confirmed patient has responsible person to drive home post procedure and observe patient for 24 hours: yes

## 2017-02-12 NOTE — Telephone Encounter (Signed)
LMTCB for pt to discuss pre-cath instructions:  Catheterization at  [facility] scheduled for: Wednesday February 6,2019 11:30AM  Arrival time and place: [facility] at 9 AM Nothing to eat or drink after midnight the night before the procedure.  HOLD medications: Metformin 02/13/17, 02/14/17 and for 48 hours after procedure.  AM meds can be taken pre-cath with sip of water including: ASA 81 mg  Need to confirm patient has responsible person to drive home post procedure and observe patient for 24 hours:

## 2017-02-14 ENCOUNTER — Ambulatory Visit (HOSPITAL_COMMUNITY)
Admission: RE | Admit: 2017-02-14 | Discharge: 2017-02-14 | Disposition: A | Discharge status: Home / Self Care | Payer: Medicare Other | Source: Ambulatory Visit | Attending: Cardiovascular Disease | Admitting: Cardiovascular Disease

## 2017-02-14 ENCOUNTER — Ambulatory Visit (HOSPITAL_COMMUNITY)
Admission: RE | Disposition: A | Discharge status: Home / Self Care | Payer: Self-pay | Source: Ambulatory Visit | Attending: Cardiovascular Disease

## 2017-02-14 ENCOUNTER — Encounter (HOSPITAL_COMMUNITY): Payer: Self-pay | Admitting: Cardiovascular Disease

## 2017-02-14 DIAGNOSIS — E119 Type 2 diabetes mellitus without complications: Secondary | ICD-10-CM | POA: Diagnosis not present

## 2017-02-14 DIAGNOSIS — I2510 Atherosclerotic heart disease of native coronary artery without angina pectoris: Secondary | ICD-10-CM | POA: Diagnosis not present

## 2017-02-14 DIAGNOSIS — G4730 Sleep apnea, unspecified: Secondary | ICD-10-CM | POA: Insufficient documentation

## 2017-02-14 DIAGNOSIS — I10 Essential (primary) hypertension: Secondary | ICD-10-CM | POA: Insufficient documentation

## 2017-02-14 DIAGNOSIS — Z79899 Other long term (current) drug therapy: Secondary | ICD-10-CM | POA: Insufficient documentation

## 2017-02-14 DIAGNOSIS — E785 Hyperlipidemia, unspecified: Secondary | ICD-10-CM | POA: Diagnosis not present

## 2017-02-14 DIAGNOSIS — E663 Overweight: Secondary | ICD-10-CM | POA: Diagnosis not present

## 2017-02-14 DIAGNOSIS — Z7982 Long term (current) use of aspirin: Secondary | ICD-10-CM | POA: Diagnosis not present

## 2017-02-14 DIAGNOSIS — Z6835 Body mass index (BMI) 35.0-35.9, adult: Secondary | ICD-10-CM | POA: Insufficient documentation

## 2017-02-14 DIAGNOSIS — I252 Old myocardial infarction: Secondary | ICD-10-CM | POA: Insufficient documentation

## 2017-02-14 DIAGNOSIS — Z7984 Long term (current) use of oral hypoglycemic drugs: Secondary | ICD-10-CM | POA: Insufficient documentation

## 2017-02-14 DIAGNOSIS — R9439 Abnormal result of other cardiovascular function study: Secondary | ICD-10-CM | POA: Insufficient documentation

## 2017-02-14 DIAGNOSIS — I25119 Atherosclerotic heart disease of native coronary artery with unspecified angina pectoris: Secondary | ICD-10-CM | POA: Diagnosis not present

## 2017-02-14 DIAGNOSIS — R0602 Shortness of breath: Secondary | ICD-10-CM | POA: Diagnosis not present

## 2017-02-14 HISTORY — PX: LEFT HEART CATH AND CORONARY ANGIOGRAPHY: CATH118249

## 2017-02-14 LAB — PROTIME-INR
INR: 1
PROTHROMBIN TIME: 13.1 s (ref 11.4–15.2)

## 2017-02-14 LAB — GLUCOSE, CAPILLARY
GLUCOSE-CAPILLARY: 115 mg/dL — AB (ref 65–99)
Glucose-Capillary: 117 mg/dL — ABNORMAL HIGH (ref 65–99)

## 2017-02-14 SURGERY — LEFT HEART CATH AND CORONARY ANGIOGRAPHY
Anesthesia: LOCAL

## 2017-02-14 MED ORDER — ACETAMINOPHEN 325 MG PO TABS: 650.0000 mg | ORAL_TABLET | ORAL | Status: DC | PRN

## 2017-02-14 MED ORDER — ONDANSETRON HCL 4 MG/2ML IJ SOLN: 4.0000 mg | Freq: Four times a day (QID) | INTRAMUSCULAR | Status: DC | PRN

## 2017-02-14 MED ORDER — LIDOCAINE HCL 1 % IJ SOLN
INTRAMUSCULAR | Status: AC
  Filled 2017-02-14: qty 20

## 2017-02-14 MED ORDER — FENTANYL CITRATE (PF) 100 MCG/2ML IJ SOLN
INTRAMUSCULAR | Status: AC
  Filled 2017-02-14: qty 2

## 2017-02-14 MED ORDER — ASPIRIN 81 MG PO CHEW: 81.0000 mg | CHEWABLE_TABLET | Freq: Every day | ORAL | Status: DC

## 2017-02-14 MED ORDER — HEPARIN (PORCINE) IN NACL 2-0.9 UNIT/ML-% IJ SOLN
INTRAMUSCULAR | Status: DC | PRN
  Administered 2017-02-14: 10:00:00

## 2017-02-14 MED ORDER — DIAZEPAM 5 MG PO TABS: 5.0000 mg | ORAL_TABLET | Freq: Four times a day (QID) | ORAL | Status: DC | PRN

## 2017-02-14 MED ORDER — SODIUM CHLORIDE 0.9% FLUSH: 3.0000 mL | INTRAVENOUS | Status: DC | PRN

## 2017-02-14 MED ORDER — VERAPAMIL HCL 2.5 MG/ML IV SOLN
INTRAVENOUS | Status: AC
  Filled 2017-02-14: qty 2

## 2017-02-14 MED ORDER — ASPIRIN 81 MG PO CHEW: 81.0000 mg | CHEWABLE_TABLET | ORAL | Status: DC

## 2017-02-14 MED ORDER — SODIUM CHLORIDE 0.9% FLUSH: 3.0000 mL | Freq: Two times a day (BID) | INTRAVENOUS | Status: DC

## 2017-02-14 MED ORDER — MIDAZOLAM HCL 2 MG/2ML IJ SOLN
INTRAMUSCULAR | Status: DC | PRN
  Administered 2017-02-14: 2 mg via INTRAVENOUS

## 2017-02-14 MED ORDER — SODIUM CHLORIDE 0.9 % IV SOLN: INTRAVENOUS | Status: DC

## 2017-02-14 MED ORDER — SODIUM CHLORIDE 0.9 % WEIGHT BASED INFUSION
3.0000 mL/kg/h | INTRAVENOUS | Status: AC
  Administered 2017-02-14: 3 mL/kg/h via INTRAVENOUS

## 2017-02-14 MED ORDER — SODIUM CHLORIDE 0.9 % IV SOLN: 250.0000 mL | INTRAVENOUS | Status: DC | PRN

## 2017-02-14 MED ORDER — HEPARIN SODIUM (PORCINE) 1000 UNIT/ML IJ SOLN
INTRAMUSCULAR | Status: AC
  Filled 2017-02-14: qty 1

## 2017-02-14 MED ORDER — HEPARIN SODIUM (PORCINE) 1000 UNIT/ML IJ SOLN
INTRAMUSCULAR | Status: DC | PRN
  Administered 2017-02-14: 6000 [IU] via INTRAVENOUS

## 2017-02-14 MED ORDER — HEPARIN (PORCINE) IN NACL 2-0.9 UNIT/ML-% IJ SOLN
INTRAMUSCULAR | Status: AC
  Filled 2017-02-14: qty 1000

## 2017-02-14 MED ORDER — LIDOCAINE HCL (PF) 1 % IJ SOLN
INTRAMUSCULAR | Status: DC | PRN
  Administered 2017-02-14: 2 mL

## 2017-02-14 MED ORDER — SODIUM CHLORIDE 0.9 % WEIGHT BASED INFUSION: 1.0000 mL/kg/h | INTRAVENOUS | Status: DC

## 2017-02-14 MED ORDER — SODIUM CHLORIDE 0.9 % IV SOLN
250.0000 mL | INTRAVENOUS | Status: DC | PRN
Start: 2017-02-14 — End: 2017-02-14

## 2017-02-14 MED ORDER — MIDAZOLAM HCL 2 MG/2ML IJ SOLN
INTRAMUSCULAR | Status: AC
  Filled 2017-02-14: qty 2

## 2017-02-14 MED ORDER — IOHEXOL 350 MG/ML SOLN
INTRAVENOUS | Status: DC | PRN
  Administered 2017-02-14: 75 mL

## 2017-02-14 MED ORDER — VERAPAMIL HCL 2.5 MG/ML IV SOLN
INTRAVENOUS | Status: DC | PRN
  Administered 2017-02-14: 10 mL via INTRA_ARTERIAL

## 2017-02-14 MED ORDER — FENTANYL CITRATE (PF) 100 MCG/2ML IJ SOLN
INTRAMUSCULAR | Status: DC | PRN
  Administered 2017-02-14: 25 ug via INTRAVENOUS

## 2017-02-14 SURGICAL SUPPLY — 11 items
CATH INFINITI 5FR ANG PIGTAIL (CATHETERS) ×1 IMPLANT
CATH OPTITORQUE TIG 4.0 5F (CATHETERS) ×1 IMPLANT
DEVICE RAD COMP TR BAND LRG (VASCULAR PRODUCTS) ×1 IMPLANT
GLIDESHEATH SLEND SS 6F .021 (SHEATH) ×1 IMPLANT
GUIDEWIRE INQWIRE 1.5J.035X260 (WIRE) IMPLANT
INQWIRE 1.5J .035X260CM (WIRE) ×2
KIT HEART LEFT (KITS) ×2 IMPLANT
PACK CARDIAC CATHETERIZATION (CUSTOM PROCEDURE TRAY) ×2 IMPLANT
SYR MEDRAD MARK V 150ML (SYRINGE) ×2 IMPLANT
TRANSDUCER W/STOPCOCK (MISCELLANEOUS) ×2 IMPLANT
TUBING CIL FLEX 10 FLL-RA (TUBING) ×2 IMPLANT

## 2017-02-14 NOTE — Interval H&P Note (Signed)
Cath Lab Visit (complete for each Cath Lab visit)  Clinical Evaluation Leading to the Procedure:   ACS: No.  Non-ACS:    Anginal Classification: CCS II  Anti-ischemic medical therapy: Minimal Therapy (1 class of medications)  Non-Invasive Test Results: Intermediate-risk stress test findings: cardiac mortality 1-3%/year  Prior CABG: No previous CABG      History and Physical Interval Note:  02/14/2017 9:44 AM  [name] [name]  has presented today for surgery, with the diagnosis of chest pain  The various methods of treatment have been discussed with the patient and family. After consideration of risks, benefits and other options for treatment, the patient has consented to  Procedure(s): LEFT HEART CATH AND CORONARY ANGIOGRAPHY (N/A) as a surgical intervention .  The patient's history has been reviewed, patient examined, no change in status, stable for surgery.  I have reviewed the patient's chart and labs.  Questions were answered to the patient's satisfaction.     Nicki Guadalajara

## 2017-02-14 NOTE — Discharge Instructions (Signed)

## 2017-02-15 MED FILL — Lidocaine HCl Local Inj 1%: INTRAMUSCULAR | Qty: 20 | Status: AC

## 2017-02-19 DIAGNOSIS — E119 Type 2 diabetes mellitus without complications: Secondary | ICD-10-CM | POA: Diagnosis not present

## 2017-02-19 DIAGNOSIS — G4733 Obstructive sleep apnea (adult) (pediatric): Secondary | ICD-10-CM | POA: Diagnosis not present

## 2017-02-19 DIAGNOSIS — R0602 Shortness of breath: Secondary | ICD-10-CM | POA: Diagnosis not present

## 2017-02-19 DIAGNOSIS — Z6837 Body mass index (BMI) 37.0-37.9, adult: Secondary | ICD-10-CM | POA: Diagnosis not present

## 2017-02-19 DIAGNOSIS — I2510 Atherosclerotic heart disease of native coronary artery without angina pectoris: Secondary | ICD-10-CM | POA: Diagnosis not present

## 2017-02-19 DIAGNOSIS — R062 Wheezing: Secondary | ICD-10-CM | POA: Diagnosis not present

## 2017-02-19 DIAGNOSIS — R0609 Other forms of dyspnea: Secondary | ICD-10-CM | POA: Diagnosis not present

## 2017-02-19 DIAGNOSIS — R5383 Other fatigue: Secondary | ICD-10-CM | POA: Diagnosis not present

## 2017-02-21 ENCOUNTER — Telehealth: Payer: Self-pay | Admitting: Cardiology

## 2017-02-21 ENCOUNTER — Other Ambulatory Visit (HOSPITAL_COMMUNITY): Payer: Self-pay | Admitting: Respiratory Therapy

## 2017-02-21 DIAGNOSIS — R0602 Shortness of breath: Secondary | ICD-10-CM

## 2017-02-21 DIAGNOSIS — R0609 Other forms of dyspnea: Secondary | ICD-10-CM

## 2017-02-21 NOTE — Telephone Encounter (Signed)
New Message   Patient is calling about his sleep study. He states that he was told that he has sleep apnea and the cardiologist would need to follow up with him. Please call to discuss.

## 2017-02-21 NOTE — Telephone Encounter (Signed)
Patient was calling in to get an update on his sleep apnea and CPAP titration. He has been informed that according to our notes it has been sent to precert for approval. Message will be routed to the sleep study coordinator for further recommendation.

## 2017-02-22 NOTE — Telephone Encounter (Signed)
Left message that the CPAP titration study has been scheduled for 03/05/17 @ 8:00 pm. He is to report to the same place where he had the initial study. [facility] sleep [facility] contact information left in the mesage if he needs to cancel or change the appointment.

## 2017-03-01 ENCOUNTER — Ambulatory Visit (HOSPITAL_COMMUNITY)
Admission: RE | Admit: 2017-03-01 | Discharge: 2017-03-01 | Disposition: A | Discharge status: Home / Self Care | Payer: Medicare Other | Source: Ambulatory Visit | Attending: Internal Medicine | Admitting: Internal Medicine

## 2017-03-01 DIAGNOSIS — R0602 Shortness of breath: Secondary | ICD-10-CM | POA: Diagnosis not present

## 2017-03-01 DIAGNOSIS — Z87891 Personal history of nicotine dependence: Secondary | ICD-10-CM | POA: Diagnosis not present

## 2017-03-01 DIAGNOSIS — R942 Abnormal results of pulmonary function studies: Secondary | ICD-10-CM | POA: Diagnosis not present

## 2017-03-01 DIAGNOSIS — R0609 Other forms of dyspnea: Secondary | ICD-10-CM | POA: Insufficient documentation

## 2017-03-01 LAB — PULMONARY FUNCTION TEST
DL/VA % pred: 84 %
DL/VA: 4 ml/min/mmHg/L
DLCO UNC: 23.81 ml/min/mmHg
DLCO unc % pred: 67 %
FEF 25-75 Pre: 2.18 L/sec
FEF2575-%PRED-PRE: 75 %
FEV1-%PRED-PRE: 71 %
FEV1-Pre: 2.62 L
FEV1FVC-%Pred-Pre: 103 %
FEV6-%Pred-Pre: 72 %
FEV6-PRE: 3.39 L
FEV6FVC-%Pred-Pre: 104 %
FVC-%PRED-PRE: 69 %
FVC-PRE: 3.4 L
PRE FEV6/FVC RATIO: 100 %
Pre FEV1/FVC ratio: 77 %
RV % pred: 122 %
RV: 3.03 L
TLC % pred: 98 %
TLC: 7.28 L

## 2017-03-05 ENCOUNTER — Ambulatory Visit (HOSPITAL_BASED_OUTPATIENT_CLINIC_OR_DEPARTMENT_OTHER): Payer: Medicare Other | Attending: Cardiovascular Disease | Admitting: Cardiovascular Disease

## 2017-03-05 VITALS — Ht 72.0 in | Wt 250.0 lb

## 2017-03-05 DIAGNOSIS — G4733 Obstructive sleep apnea (adult) (pediatric): Secondary | ICD-10-CM | POA: Insufficient documentation

## 2017-03-05 DIAGNOSIS — Z7984 Long term (current) use of oral hypoglycemic drugs: Secondary | ICD-10-CM | POA: Insufficient documentation

## 2017-03-05 DIAGNOSIS — Z79899 Other long term (current) drug therapy: Secondary | ICD-10-CM | POA: Diagnosis not present

## 2017-03-05 DIAGNOSIS — I2510 Atherosclerotic heart disease of native coronary artery without angina pectoris: Secondary | ICD-10-CM

## 2017-03-05 DIAGNOSIS — Z7982 Long term (current) use of aspirin: Secondary | ICD-10-CM | POA: Insufficient documentation

## 2017-03-09 DIAGNOSIS — E785 Hyperlipidemia, unspecified: Secondary | ICD-10-CM | POA: Diagnosis not present

## 2017-03-09 DIAGNOSIS — E1165 Type 2 diabetes mellitus with hyperglycemia: Secondary | ICD-10-CM | POA: Diagnosis not present

## 2017-03-09 DIAGNOSIS — I10 Essential (primary) hypertension: Secondary | ICD-10-CM | POA: Diagnosis not present

## 2017-03-12 DIAGNOSIS — I10 Essential (primary) hypertension: Secondary | ICD-10-CM | POA: Diagnosis not present

## 2017-03-12 DIAGNOSIS — Z6834 Body mass index (BMI) 34.0-34.9, adult: Secondary | ICD-10-CM | POA: Diagnosis not present

## 2017-03-12 DIAGNOSIS — E119 Type 2 diabetes mellitus without complications: Secondary | ICD-10-CM | POA: Diagnosis not present

## 2017-03-12 DIAGNOSIS — R0602 Shortness of breath: Secondary | ICD-10-CM | POA: Diagnosis not present

## 2017-03-12 DIAGNOSIS — E785 Hyperlipidemia, unspecified: Secondary | ICD-10-CM | POA: Diagnosis not present

## 2017-03-12 DIAGNOSIS — Q249 Congenital malformation of heart, unspecified: Secondary | ICD-10-CM | POA: Diagnosis not present

## 2017-03-13 ENCOUNTER — Other Ambulatory Visit: Payer: Self-pay | Admitting: Internal Medicine

## 2017-03-13 DIAGNOSIS — R944 Abnormal results of kidney function studies: Secondary | ICD-10-CM

## 2017-03-13 DIAGNOSIS — R635 Abnormal weight gain: Secondary | ICD-10-CM

## 2017-03-16 ENCOUNTER — Other Ambulatory Visit (HOSPITAL_COMMUNITY): Payer: Self-pay | Admitting: Internal Medicine

## 2017-03-16 DIAGNOSIS — R635 Abnormal weight gain: Secondary | ICD-10-CM

## 2017-03-16 DIAGNOSIS — R944 Abnormal results of kidney function studies: Secondary | ICD-10-CM

## 2017-03-18 ENCOUNTER — Encounter (HOSPITAL_BASED_OUTPATIENT_CLINIC_OR_DEPARTMENT_OTHER): Payer: Self-pay | Admitting: Cardiovascular Disease

## 2017-03-18 NOTE — Procedures (Addendum)
Patient Name: [name] [name], [name] [name] Date: 03/05/2017 Gender: Male D.O.B: 07/11/1950 Age (years): 73 Referring Provider: Corine Shelter Height (inches): 72 Interpreting Physician: Nicki Guadalajara MD, ABSM Weight (lbs): 250 RPSGT: Shelah Lewandowsky BMI: 34 MRN: [id number] Neck Size: 18.00  CLINICAL INFORMATION The patient is referred for a CPAP titration to treat sleep apnea.  Date of NPSG: 01/11/2017: AHI 34.2/h; AHI during REM sleep 31.2/h; supine AHI 105/h; oxygen desaturation to a nadir of 80%; loud snoring.  SLEEP STUDY TECHNIQUE As per the AASM Manual for the Scoring of Sleep and Associated Events v2.3 (April 2016) with a hypopnea requiring 4% desaturations.  The channels recorded and monitored were frontal, central and occipital EEG, electrooculogram (EOG), submentalis EMG (chin), nasal and oral airflow, thoracic and abdominal wall motion, anterior tibialis EMG, snore microphone, electrocardiogram, and pulse oximetry. Continuous positive airway pressure (CPAP) was initiated at the beginning of the study and titrated to treat sleep-disordered breathing.  MEDICATIONS aspirin 81 MG tablet atorvastatin (LIPITOR) 40 MG tablet lisinopril (PRINIVIL,ZESTRIL) 5 MG tablet metFORMIN (GLUCOPHAGE) 500 MG tablet metoprolol succinate (TOPROL-XL) 100 MG 24 hr tablet nitroGLYCERIN (NITROSTAT) 0.4 MG SL tablet  Medications self-administered by patient taken the night of the study : N/A  TECHNICIAN COMMENTS Comments added by technician: Patient had difficulty initiating sleep.  RESPIRATORY PARAMETERS Optimal PAP Pressure (cm): 16 AHI at Optimal Pressure (/hr): 5.1 Overall Minimal O2 (%): 89.0 Supine % at Optimal Pressure (%): 15 Minimal O2 at Optimal Pressure (%): 90.0   SLEEP ARCHITECTURE The study was initiated at 10:27:43 PM and ended at 4:50:42 AM.  Sleep onset time was 51.4 minutes and the sleep efficiency was 63.6%%. The total sleep time was 243.5 minutes.  The patient spent  30.0%% of the night in stage N1 sleep, 51.5%% in stage N2 sleep, 0.0%% in stage N3 and 18.48% in REM.Stage REM latency was 170.0 minutes  Wake after sleep onset was 88.1. Alpha intrusion was absent. Supine sleep was 19.47%.  CARDIAC DATA The 2 lead EKG demonstrated sinus rhythm. The mean heart rate was 59.4 beats per minute. Other EKG findings include: PVCs.  LEG MOVEMENT DATA The total Periodic Limb Movements of Sleep (PLMS) were 0. The PLMS index was 0.0. A PLMS index of <15 is considered normal in adults.  IMPRESSIONS - The patient was titrated with CPAP from 5 cm up to 16 cm water pressure. At 11 cm, AHI was 0 and RDI 4/h; however, there was only 17:59 minutes of non-supine sleep at this pressure and REM sleep was not achieved. As pressures increased above 11 centimeters, central events developed. Central events seem to occur predominantly with supine sleep at higher pressures. At 16 cm pressure, the patient slept for 75.17 minutes (28:30 in REM sleep) and AHI was 5.1/hr and RDI 10.1/h with 3 central events. CPAP failed at higher pressures to treat REM related and supine sleep. 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 - Mild oxygen desaturations to a nadir of 89%. - The patient snored with moderate snoring volume during this titration study. - 2-lead EKG demonstrated: occasonal PVCs - Clinically significant periodic limb movements were not noted during this study. Arousals associated with PLMs were rare.  DIAGNOSIS - Obstructive Sleep Apnea (327.23 [G47.33 ICD-10])  RECOMMENDATIONS - Due to the severe positional component in supine sleep on the diagnostic study (AHI 105/h ) and central events at higher pressures with CPAP, recommend an expeditious BiPAP in lab titration study.  - Efforts should be made to optimize nasal and oropharyngeal patency. -  Avoid alcohol, sedatives and other CNS depressants that may worsen sleep apnea and disrupt normal sleep architecture. - Consider positional therapy  to avoid supine sleep - Sleep hygiene should be reviewed to assess factors that may improve sleep quality. - Weight management and regular exercise should be initiated or continued. - Return to sleep clinic for after BiPAP therapy instituted and download obtained after 4 weeks of therapy.   [Electronically signed] 03/18/2017 10:52 AM  Nicki Guadalajara MD, Donneta Romberg Diplomate, American Board of Sleep Medicine   NPI: [id number] [facility] PH: [phone]   FX: [phone] ACCREDITED BY THE AMERICAN ACADEMY OF SLEEP MEDICINE

## 2017-03-19 ENCOUNTER — Other Ambulatory Visit: Payer: Self-pay | Admitting: Cardiovascular Disease

## 2017-03-19 ENCOUNTER — Telehealth: Payer: Self-pay | Admitting: *Deleted

## 2017-03-19 DIAGNOSIS — E669 Obesity, unspecified: Secondary | ICD-10-CM

## 2017-03-19 DIAGNOSIS — R0609 Other forms of dyspnea: Secondary | ICD-10-CM

## 2017-03-19 DIAGNOSIS — I10 Essential (primary) hypertension: Secondary | ICD-10-CM

## 2017-03-19 DIAGNOSIS — G4733 Obstructive sleep apnea (adult) (pediatric): Secondary | ICD-10-CM

## 2017-03-19 NOTE — Progress Notes (Signed)
03/19/17 Patient notified of CPAP titration study and recommedations. BIPAP titration scheduled for 04/01/17.

## 2017-03-19 NOTE — Telephone Encounter (Signed)
-----   Message from Lennette Bihari, MD sent at 03/18/2017 11:01 AM EDT ----- Burna Mortimer, please try to arrange for an in lab expeditious BiPAP titration trial.  The patient developed central events at CPAP pressures over 11 and has a severe positional component to his sleep apnea.

## 2017-03-19 NOTE — Telephone Encounter (Signed)
Patient returned a call to me and was given his CPAP titration results and recommendations. BIPAP titration study planned for March 24th.

## 2017-03-22 ENCOUNTER — Ambulatory Visit (HOSPITAL_COMMUNITY)
Admission: RE | Admit: 2017-03-22 | Discharge: 2017-03-22 | Disposition: A | Discharge status: Home / Self Care | Payer: Medicare Other | Source: Ambulatory Visit | Attending: Internal Medicine | Admitting: Internal Medicine

## 2017-03-22 DIAGNOSIS — R944 Abnormal results of kidney function studies: Secondary | ICD-10-CM | POA: Diagnosis not present

## 2017-03-22 DIAGNOSIS — R932 Abnormal findings on diagnostic imaging of liver and biliary tract: Secondary | ICD-10-CM | POA: Insufficient documentation

## 2017-03-22 DIAGNOSIS — R635 Abnormal weight gain: Secondary | ICD-10-CM | POA: Diagnosis not present

## 2017-03-22 DIAGNOSIS — R9389 Abnormal findings on diagnostic imaging of other specified body structures: Secondary | ICD-10-CM | POA: Insufficient documentation

## 2017-03-27 DIAGNOSIS — J441 Chronic obstructive pulmonary disease with (acute) exacerbation: Secondary | ICD-10-CM | POA: Diagnosis not present

## 2017-03-27 DIAGNOSIS — K760 Fatty (change of) liver, not elsewhere classified: Secondary | ICD-10-CM | POA: Diagnosis not present

## 2017-03-27 DIAGNOSIS — R0602 Shortness of breath: Secondary | ICD-10-CM | POA: Diagnosis not present

## 2017-03-27 DIAGNOSIS — Q249 Congenital malformation of heart, unspecified: Secondary | ICD-10-CM | POA: Diagnosis not present

## 2017-03-27 DIAGNOSIS — R944 Abnormal results of kidney function studies: Secondary | ICD-10-CM | POA: Diagnosis not present

## 2017-03-27 DIAGNOSIS — E1165 Type 2 diabetes mellitus with hyperglycemia: Secondary | ICD-10-CM | POA: Diagnosis not present

## 2017-03-27 DIAGNOSIS — E119 Type 2 diabetes mellitus without complications: Secondary | ICD-10-CM | POA: Diagnosis not present

## 2017-03-27 DIAGNOSIS — Z6838 Body mass index (BMI) 38.0-38.9, adult: Secondary | ICD-10-CM | POA: Diagnosis not present

## 2017-03-27 DIAGNOSIS — R5383 Other fatigue: Secondary | ICD-10-CM | POA: Diagnosis not present

## 2017-03-27 DIAGNOSIS — Z6837 Body mass index (BMI) 37.0-37.9, adult: Secondary | ICD-10-CM | POA: Diagnosis not present

## 2017-03-27 DIAGNOSIS — G4733 Obstructive sleep apnea (adult) (pediatric): Secondary | ICD-10-CM | POA: Diagnosis not present

## 2017-03-27 DIAGNOSIS — I10 Essential (primary) hypertension: Secondary | ICD-10-CM | POA: Diagnosis not present

## 2017-03-27 DIAGNOSIS — E785 Hyperlipidemia, unspecified: Secondary | ICD-10-CM | POA: Diagnosis not present

## 2017-03-27 DIAGNOSIS — R062 Wheezing: Secondary | ICD-10-CM | POA: Diagnosis not present

## 2017-03-27 DIAGNOSIS — R0609 Other forms of dyspnea: Secondary | ICD-10-CM | POA: Diagnosis not present

## 2017-03-27 DIAGNOSIS — I2510 Atherosclerotic heart disease of native coronary artery without angina pectoris: Secondary | ICD-10-CM | POA: Diagnosis not present

## 2017-04-01 ENCOUNTER — Ambulatory Visit (HOSPITAL_BASED_OUTPATIENT_CLINIC_OR_DEPARTMENT_OTHER): Payer: Medicare Other | Attending: Cardiovascular Disease | Admitting: Cardiovascular Disease

## 2017-04-01 DIAGNOSIS — Z6830 Body mass index (BMI) 30.0-30.9, adult: Secondary | ICD-10-CM | POA: Insufficient documentation

## 2017-04-01 DIAGNOSIS — I10 Essential (primary) hypertension: Secondary | ICD-10-CM | POA: Diagnosis not present

## 2017-04-01 DIAGNOSIS — G4733 Obstructive sleep apnea (adult) (pediatric): Secondary | ICD-10-CM | POA: Insufficient documentation

## 2017-04-01 DIAGNOSIS — E669 Obesity, unspecified: Secondary | ICD-10-CM | POA: Diagnosis not present

## 2017-04-01 DIAGNOSIS — R0600 Dyspnea, unspecified: Secondary | ICD-10-CM | POA: Diagnosis not present

## 2017-04-01 DIAGNOSIS — R0681 Apnea, not elsewhere classified: Secondary | ICD-10-CM

## 2017-04-01 DIAGNOSIS — R0609 Other forms of dyspnea: Secondary | ICD-10-CM

## 2017-04-14 ENCOUNTER — Other Ambulatory Visit: Payer: Self-pay | Admitting: Cardiology

## 2017-04-22 ENCOUNTER — Encounter (HOSPITAL_BASED_OUTPATIENT_CLINIC_OR_DEPARTMENT_OTHER): Payer: Self-pay | Admitting: Cardiovascular Disease

## 2017-04-22 NOTE — Procedures (Signed)
Patient Name: [name] [name], Javid Date: 04/01/2017 Gender: Male D.O.B: 1950/11/15 Age (years): 89 Referring Provider: Nicki Guadalajara MD, ABSM Height (inches): 70 Interpreting Physician: Nicki Guadalajara MD, ABSM Weight (lbs): 257 RPSGT: Cherylann Parr BMI: 37 MRN: [id number] Neck Size: 18.00  CLINICAL INFORMATION The patient is referred for a BiPAP titration to treat sleep apnea.  Date of NPSG, Split Night or HST: 01/11/2017: AHI 34.2/h; RDI 34.3/h; REM AHI 31.2/h; supine sleep AHI 105/h; oxygen desaturation to 80%.  SLEEP STUDY TECHNIQUE As per the AASM Manual for the Scoring of Sleep and Associated Events v2.3 (April 2016) with a hypopnea requiring 4% desaturations.  The channels recorded and monitored were frontal, central and occipital EEG, electrooculogram (EOG), submentalis EMG (chin), nasal and oral airflow, thoracic and abdominal wall motion, anterior tibialis EMG, snore microphone, electrocardiogram, and pulse oximetry. Bilevel positive airway pressure (BPAP) was initiated at the beginning of the study and titrated to treat sleep-disordered breathing.  MEDICATIONS     aspirin 81 MG tablet         atorvastatin (LIPITOR) 40 MG tablet         lisinopril (PRINIVIL,ZESTRIL) 5 MG tablet         metFORMIN (GLUCOPHAGE) 500 MG tablet         metoprolol succinate (TOPROL-XL) 100 MG 24 hr tablet         nitroGLYCERIN (NITROSTAT) 0.4 MG SL tablet      Medications self-administered by patient taken the night of the study : N/A  RESPIRATORY PARAMETERS Optimal IPAP Pressure (cm): 18 AHI at Optimal Pressure (/hr) 9.0 Optimal EPAP Pressure (cm): 14   Overall Minimal O2 (%): 88.0 Minimal O2 at Optimal Pressure (%): 91.0  SLEEP ARCHITECTURE Start Time: 10:42:57 PM Stop Time: 4:55:27 AM Total Time (min): 372.5 Total Sleep Time (min): 287.6 Sleep Latency (min): 55.4 Sleep Efficiency (%): 77.2% REM Latency (min): 50.0 WASO (min): 29.5 Stage N1 (%): 13.8% Stage N2 (%): 64.7% Stage N3  (%): 0.0% Stage R (%): 21.56 Supine (%): 19.65 Arousal Index (/hr): 2.7   CARDIAC DATA The 2 lead EKG demonstrated sinus rhythm. The mean heart rate was 51.9 beats per minute. Other EKG findings include: PVCs.  LEG MOVEMENT DATA The total Periodic Limb Movements of Sleep (PLMS) were 0. The PLMS index was 0.0. A PLMS index of <15 is considered normal in adults.  IMPRESSIONS - BiPAP was instituted at 8/4 and was titrated up to 18/14. At 18/14,  AHI was 9 with 4 central events. Oxygen saturation nadir was 91%. - Mild Central Sleep Apnea was noted during this titration (CAI = 8.8/h). - Mild oxygen desaturations were observed during this titration (min O2 = 88.0%). - No snoring was audible during this study. - 2-lead EKG demonstrated: PVCs - Clinically significant periodic limb movements were not noted during this study. Arousals associated with PLMs were rare.  DIAGNOSIS - Obstructive Sleep Apnea (327.23 [G47.33 ICD-10]) - Central Sleep Apnea  RECOMMENDATIONS - Recommend an initial trial of BiPAP Auto therapy within an EPAP min of 13, pressure support of 4 and IPAP max of 25cm H2O with heated humidification.  A Large size [employer] Full Face Mask Simplus mask was used for the titration.  If continued central events developed, may need to evaluate for possible ASV titration. - Efforts should be made to optimize nasal and oropharyngeal patency - Avoid alcohol, sedatives and other CNS depressants that may worsen sleep apnea and disrupt normal sleep architecture. - Sleep hygiene should be reviewed to  assess factors that may improve sleep quality. - Weight management and regular exercise should be initiated or continued. - Retcommend a download be obtained in 30 days and sleep clinic follow-up evaluation after 4 weeks of therapy.  [Electronically signed] 04/22/2017 08:17 PM  Nicki Guadalajara MD, [facility], ABSM Diplomate, American Board of Sleep Medicine   NPI: [id number] [facility] PH: [phone]   FX: [phone] ACCREDITED BY THE AMERICAN ACADEMY OF SLEEP MEDICINE

## 2017-04-23 ENCOUNTER — Telehealth: Payer: Self-pay | Admitting: *Deleted

## 2017-04-23 NOTE — Telephone Encounter (Signed)
-----   Message from Lennette Bihari, MD sent at 04/22/2017  8:22 PM EDT ----- Bjorn Loser, please notify the patient of the results.  Set up with DME company for initial BiPAP therapy and sleep clinic evaluation

## 2017-04-23 NOTE — Telephone Encounter (Signed)
Left message referral for his BIPAP machine has been sent to [facility] for set up. Once they have approved benefits from his insurance company they will be contacting him to set up for treatment. Also stated this can take anywhere from 1-2 weeks. Contact #'s for myself as well as [employer] provided to patient to check status if he doesn't hear from anyone, or if he needs to contact us.

## 2017-06-14 DIAGNOSIS — E119 Type 2 diabetes mellitus without complications: Secondary | ICD-10-CM | POA: Diagnosis not present

## 2017-06-14 DIAGNOSIS — E785 Hyperlipidemia, unspecified: Secondary | ICD-10-CM | POA: Diagnosis not present

## 2017-06-18 DIAGNOSIS — I10 Essential (primary) hypertension: Secondary | ICD-10-CM | POA: Diagnosis not present

## 2017-06-18 DIAGNOSIS — Z6838 Body mass index (BMI) 38.0-38.9, adult: Secondary | ICD-10-CM | POA: Diagnosis not present

## 2017-06-18 DIAGNOSIS — R944 Abnormal results of kidney function studies: Secondary | ICD-10-CM | POA: Diagnosis not present

## 2017-06-18 DIAGNOSIS — R0602 Shortness of breath: Secondary | ICD-10-CM | POA: Diagnosis not present

## 2017-06-18 DIAGNOSIS — G4733 Obstructive sleep apnea (adult) (pediatric): Secondary | ICD-10-CM | POA: Diagnosis not present

## 2017-06-18 DIAGNOSIS — E782 Mixed hyperlipidemia: Secondary | ICD-10-CM | POA: Diagnosis not present

## 2017-06-18 DIAGNOSIS — E1169 Type 2 diabetes mellitus with other specified complication: Secondary | ICD-10-CM | POA: Diagnosis not present

## 2017-07-30 ENCOUNTER — Telehealth: Payer: Self-pay | Admitting: *Deleted

## 2017-07-30 NOTE — Telephone Encounter (Signed)
Patient returned a call and was notified that his compliance appointment had to be moved up to 08/13/17. He needed to be seen on or before 08/20/17. Patient voiced understanding and states he will be here on 08/13/17.

## 2017-07-30 NOTE — Telephone Encounter (Signed)
Left message to return a call. Will need to change his BIPAP compliance appointment.

## 2017-08-10 DIAGNOSIS — E1165 Type 2 diabetes mellitus with hyperglycemia: Secondary | ICD-10-CM | POA: Diagnosis not present

## 2017-08-10 DIAGNOSIS — E782 Mixed hyperlipidemia: Secondary | ICD-10-CM | POA: Diagnosis not present

## 2017-08-10 DIAGNOSIS — I10 Essential (primary) hypertension: Secondary | ICD-10-CM | POA: Diagnosis not present

## 2017-08-10 DIAGNOSIS — G4733 Obstructive sleep apnea (adult) (pediatric): Secondary | ICD-10-CM | POA: Diagnosis not present

## 2017-08-10 DIAGNOSIS — M79672 Pain in left foot: Secondary | ICD-10-CM | POA: Diagnosis not present

## 2017-08-10 DIAGNOSIS — Z6838 Body mass index (BMI) 38.0-38.9, adult: Secondary | ICD-10-CM | POA: Diagnosis not present

## 2017-08-10 DIAGNOSIS — M65322 Trigger finger, left index finger: Secondary | ICD-10-CM | POA: Diagnosis not present

## 2017-08-13 ENCOUNTER — Encounter: Payer: Self-pay | Admitting: Cardiovascular Disease

## 2017-08-13 ENCOUNTER — Ambulatory Visit (INDEPENDENT_AMBULATORY_CARE_PROVIDER_SITE_OTHER): Payer: Medicare Other | Admitting: Cardiovascular Disease

## 2017-08-13 VITALS — BP 120/82 | HR 54 | Ht 72.0 in | Wt 269.8 lb

## 2017-08-13 DIAGNOSIS — G4733 Obstructive sleep apnea (adult) (pediatric): Secondary | ICD-10-CM | POA: Diagnosis not present

## 2017-08-13 DIAGNOSIS — I2510 Atherosclerotic heart disease of native coronary artery without angina pectoris: Secondary | ICD-10-CM

## 2017-08-13 DIAGNOSIS — E668 Other obesity: Secondary | ICD-10-CM | POA: Diagnosis not present

## 2017-08-13 DIAGNOSIS — I10 Essential (primary) hypertension: Secondary | ICD-10-CM | POA: Diagnosis not present

## 2017-08-13 NOTE — Patient Instructions (Signed)
Your physician wants you to follow-up in: 1 year with Dr. Kelly (sleep clinic). You will receive a reminder letter in the mail two months in advance. If you don't receive a letter, please call our office to schedule the follow-up appointment.  

## 2017-08-13 NOTE — Progress Notes (Signed)
Cardiology Office Note    Date:  08/19/2017   ID:  [name] [name], DOB [birth year], MRN [id number]  PCP:  Celene Squibb, MD  Cardiologist:  Shelva Majestic, MD (sleep); Dr. Percival Spanish  Chief Complaint  Patient presents with  . office visit    BIPAP compliance, face mask moves around when sleeping    History of Present Illness:  [name] [name] is a 67 y.o. male followed by Dr. Percival Spanish for cardiology care.  He has known CAD status post MI 2008 treated with RCA bare-metal stent.  In February 2019 his most recent catheterization revealed mild nonobstructive CAD with 20% proximal LAD stenosis before the takeoff of the first diagonal vessel with smooth 30 to 40% narrowing beyond the diagonal; normal left circumflex; and large dominant RCA with a widely patent RCA stent and minimal luminal irregularity of 10% proximally and distally.  He was recently diagnosed with obstructive sleep apnea and on diagnostic polysomnogram of January 11, 2017 he was found to have severe OSA with an AHI of 34.2.  There was moderate oxygen desaturation to a nadir of 80%.  There was a significant positional component and his AHI with supine sleep was 105/H.  On every 25 2019 a CPAP titration study was performed and due to his severe positional component in supine sleep on the diagnostic study, and central events at higher pressure on CPAP therapy, BiPAP titration was recommended.  BiPAP therapy was initiated on May 22, 2017 with [facility] as his DME.  He has been on a minimum EPAP pressure of 13 with a potential maximum IPAP pressure of 25.  A download from July 14, 2017 through August 12, 2017 shows excellent compliance at 100%.  He is averaging 7 hours and 20 minutes of sleep with BiPAP use.  His 95th percentile pressure is 20.4/16.4 with a maximum average pressure at 21.7/17.7.  Cysts since initiating BiPAP therapy, he has more energy.  He denies daytime sleepiness.  An Epworth Sleepiness Scale score was calculated in the office  today and this endorsed at 3.  He is unaware of breakthrough snoring.  He is exercising and walks at least 3 miles per day.    Past Medical History:  Diagnosis Date  . Coronary artery disease    S/P stent Summer 2008, January 2016 left main 30% stenosis, LAD mid 50% stenosis, patent stent in the mid right coronary artery with ectasia proximal and distal to the stent. EF mildly reduced at 40% with global hypokinesis.  . Diabetes mellitus (HCC)    A1c 6.1 (11-2008)  . Erectile dysfunction   . Hyperlipidemia   . Hypertension   . MI (mitral incompetence)     Past Surgical History:  Procedure Laterality Date  . COLONOSCOPY     negative  . LEFT HEART CATH AND CORONARY ANGIOGRAPHY N/A 02/14/2017   Procedure: LEFT HEART CATH AND CORONARY ANGIOGRAPHY;  Surgeon: Troy Sine, MD;  Location: [facility];  Service: Cardiovascular;  Laterality: N/A;  . WRIST SURGERY     fractured wrist, Dr. Amedeo Plenty    Current Medications: Outpatient Medications Prior to Visit  Medication Sig Dispense Refill  . aspirin 81 MG tablet Take 81 mg by mouth daily.      Marland Kitchen atorvastatin (LIPITOR) 40 MG tablet TAKE 1 TABLET BY MOUTH ONCE DAILY 90 tablet 2  . furosemide (LASIX) 20 MG tablet Take 20 mg by mouth daily.  3  . JARDIANCE 10 MG TABS tablet     .  lisinopril (PRINIVIL,ZESTRIL) 5 MG tablet TAKE 1 TABLET BY MOUTH ONCE DAILY 90 tablet 3  . metFORMIN (GLUCOPHAGE) 500 MG tablet TAKE 1 TABLET BY MOUTH ONCE DAILY 90 tablet 3  . metoprolol succinate (TOPROL-XL) 100 MG 24 hr tablet TAKE 1 TABLET BY MOUTH ONCE DAILY WITH  OR  IMMEDIATELY  FOLLOWING  A  MEAL 90 tablet 3  . nitroGLYCERIN (NITROSTAT) 0.4 MG SL tablet Place 1 tablet (0.4 mg total) under the tongue every 5 (five) minutes as needed. 25 tablet 2  . potassium chloride (K-DUR) 10 MEQ tablet   4   No facility-administered medications prior to visit.      Allergies:   Patient has no known allergies.   Social History   Socioeconomic History  .  Marital status: Married    Spouse name: Not on file  . Number of children: 2  . Years of education: Not on file  . Highest education level: Not on file  Occupational History  . Occupation: Buyer, retail: [employer]  . Financial resource strain: Not on file  . Food insecurity:    Worry: Not on file    Inability: Not on file  . Transportation needs:    Medical: Not on file    Non-medical: Not on file  Tobacco Use  . Smoking status: Former Smoker    Last attempt to quit: 01/10/2003    Years since quitting: 14.6  . Smokeless tobacco: Never Used  Substance and Sexual Activity  . Alcohol use: Yes    Comment: rarely  . Drug use: No  . Sexual activity: Not on file  Lifestyle  . Physical activity:    Days per week: Not on file    Minutes per session: Not on file  . Stress: Not on file  Relationships  . Social connections:    Talks on phone: Not on file    Gets together: Not on file    Attends religious service: Not on file    Active member of club or organization: Not on file    Attends meetings of clubs or organizations: Not on file    Relationship status: Not on file  Other Topics Concern  . Not on file  Social History Narrative   06/14/2009 designated party form signed appointing wife Andrei Mccook; Ok to leave message on home answering machine at [phone]     Family History:  The patient's family history includes CAD (age of onset: 72) in his brother; CAD (age of onset: 23) in his brother; Colon cancer in his father; Coronary artery disease (age of onset: 23) in his father; Hypertension in his brother; Pernicious anemia in his maternal grandmother; Stroke in his maternal grandfather; Stroke (age of onset: 84) in his mother.   ROS General: Negative; No fevers, chills, or night sweats;  HEENT: Negative; No changes in vision or hearing, sinus congestion, difficulty swallowing Pulmonary: Negative; No cough, wheezing, shortness of breath,  hemoptysis Cardiovascular: Negative; No chest pain, presyncope, syncope, palpitations GI: Negative; No nausea, vomiting, diarrhea, or abdominal pain GU: Negative; No dysuria, hematuria, or difficulty voiding Musculoskeletal: Negative; no myalgias, joint pain, or weakness Hematologic/Oncology: Negative; no easy bruising, bleeding Endocrine: Negative; no heat/cold intolerance; no diabetes Neuro: Negative; no changes in balance, headaches Skin: Negative; No rashes or skin lesions Psychiatric: Negative; No behavioral problems, depression Sleep: Positive for OSA, now on BiPAP therapy.  No breakthrough snoring, daytime sleepiness, hypersomnolence, bruxism, restless legs, hypnogognic hallucinations, no cataplexy Other  comprehensive 14 point system review is negative.   PHYSICAL EXAM:   VS:  BP 120/82 (BP Location: Left Arm, Patient Position: Sitting)   Pulse (!) 54   Ht 6' (1.829 m)   Wt 269 lb 12.8 oz (122.4 kg)   BMI 36.59 kg/m     Repeat blood pressure by me was 108/62  Wt Readings from Last 3 Encounters:  08/13/17 269 lb 12.8 oz (122.4 kg)  04/01/17 257 lb (116.6 kg)  03/05/17 250 lb (113.4 kg)    General: Alert, oriented, no distress.  Skin: normal turgor, no rashes, warm and dry HEENT: Normocephalic, atraumatic. Pupils equal round and reactive to light; sclera anicteric; extraocular muscles intact; Fundi not hemorrhages or exudates. Nose without nasal septal hypertrophy Mouth/Parynx benign; Mallinpatti scale 3/4 Neck: No JVD, no carotid bruits; normal carotid upstroke Lungs: clear to ausculatation and percussion; no wheezing or rales Chest wall: without tenderness to palpitation Heart: PMI not displaced, RRR, s1 s2 normal, 1/6 systolic murmur, no diastolic murmur, no rubs, gallops, thrills, or heaves Abdomen: Prominent diastases recti; soft, nontender; no hepatosplenomehaly, BS+; abdominal aorta nontender and not dilated by palpation. Back: no CVA tenderness Pulses  2+ Musculoskeletal: full range of motion, normal strength, no joint deformities Extremities: no clubbing cyanosis or edema, Homan's sign negative  Neurologic: grossly nonfocal; Cranial nerves grossly wnl Psychologic: Normal mood and affect   Studies/Labs Reviewed:   EKG:  EKG is  ordered today.  ECG (independently read by me): Sinus bradycardia 54 bpm.  Normal intervals.  No significant ST-T changes.  No ectopy.  Recent Labs: BMP Latest Ref Rng & Units 02/09/2017 04/11/2016 08/29/2007  Glucose 65 - 99 mg/dL 91 93 92  BUN 8 - 27 mg/dL '17 12 10  ' Creatinine 0.76 - 1.27 mg/dL 1.30(H) 1.24 1.2  BUN/Creat Ratio 10 - '24 13 10 ' -  Sodium 134 - 144 mmol/L 140 139 139  Potassium 3.5 - 5.2 mmol/L 4.6 4.4 4.0  Chloride 96 - 106 mmol/L 100 101 106  CO2 20 - 29 mmol/L '24 22 28  ' Calcium 8.6 - 10.2 mg/dL 9.2 9.2 9.1     Hepatic Function Latest Ref Rng & Units 08/22/2010 02/14/2007 09/07/2006  Total Protein - - - 6.7  Albumin - - - 3.9  AST 0 - 37 U/L 24 47(H) 40(H)  ALT 0 - 53 units/L - 46 30  Alk Phosphatase - - - 69  Total Bilirubin - - - 1.4(H)  Bilirubin, Direct - - - 0.2    CBC Latest Ref Rng & Units 02/09/2017 04/11/2016 08/29/2007  WBC 3.4 - 10.8 x10E3/uL 6.0 7.3 8.4  Hemoglobin 13.0 - 17.7 g/dL 14.0 14.7 14.1  Hematocrit 37.5 - 51.0 % 42.5 43.3 39.9  Platelets 150 - 379 x10E3/uL 221 216 230   Lab Results  Component Value Date   MCV 87 02/09/2017   MCV 89 04/11/2016   MCV 92.2 08/29/2007   Lab Results  Component Value Date   TSH 4.010 02/09/2017   Lab Results  Component Value Date   HGBA1C 6.1 (H) 04/11/2016     BNP No results found for: BNP  ProBNP No results found for: PROBNP   Lipid Panel     Component Value Date/Time   CHOL 130 04/11/2016 0921   TRIG 122 04/11/2016 0921   HDL 37 (L) 04/11/2016 0921   CHOLHDL 3.5 04/11/2016 0921   CHOLHDL 4 08/22/2010 1101   VLDL 24.8 08/22/2010 1101   LDLCALC 69 04/11/2016 0921  RADIOLOGY: No results found.   Additional  studies/ records that were reviewed today include:  I reviewed the patient's records from Dr. Percival Spanish as well as his sleep evaluations at [facility].  I obtained a download in the office  ASSESSMENT:    1. OSA (obstructive sleep apnea) on Bi-PAP   2. Essential hypertension   3. Moderate obesity   4. Coronary artery disease involving native coronary artery of native heart without angina pectoris     PLAN:  Mr. Hailey Miles is a 67 year old gentleman who is status post remote MI treated with RCA bare-metal stenting in 2008.  His most recent cardiac catheterization showed a widely patent stent in his RCA and mild nonobstructive CAD.  He was found to have severe obstructive sleep apnea with an AHI of 34.2/h, in rem AHI of 31.2/h.  He had very severe sleep apnea with supine position at 105/h and there was significant oxygen desaturation to 80%.  Therapy has been initiated with BiPAP with a set up date of May 22, 2017.  He is meeting compliance standards with 100% usage and averaging 7 hours and 20 minutes of sleep per night.  His AHI is 3.2.  He is requiring high pressure with a 95th percentile pressure 20.4/16.4.  He does not have any significant mask leak.  We discussed the effects of sleep apnea on normal sleep architecture as well as increased cardiovascular risk associated if sleep apnea is untreated.  Presently he is doing exceptionally well.  He notes more energy, denies significant nocturia.  There is no residual daytime sleepiness.  Blood pressure today is stable on his current medical regimen consisting of lisinopril 5 mg in addition to Toprol-XL 100 mg.  He is on atorvastatin 40 mg for hyperlipidemia.  He is diabetic on metformin.  We discussed the importance of weight loss and exercise.  BMI is 36.59.  He is continuing to do well.  I answered all his questions.  Per Medicare requirements, I will see him in 1 year for sleep reevaluation.   Medication Adjustments/Labs and  Tests Ordered: Current medicines are reviewed at length with the patient today.  Concerns regarding medicines are outlined above.  Medication changes, Labs and Tests ordered today are listed in the Patient Instructions below. Patient Instructions  Your physician wants you to follow-up in: 1 year with Dr. Claiborne Billings (sleep clinic). You will receive a reminder letter in the mail two months in advance. If you don't receive a letter, please call our office to schedule the follow-up appointment.     Signed, Shelva Majestic, MD  08/19/2017 1:47 PM    [facility] [address], [place], [place], [place]  [address] Phone: [phone]

## 2017-08-19 ENCOUNTER — Encounter: Payer: Self-pay | Admitting: Cardiovascular Disease

## 2017-09-06 ENCOUNTER — Ambulatory Visit: Payer: Self-pay | Admitting: Cardiovascular Disease

## 2017-10-03 DIAGNOSIS — E1165 Type 2 diabetes mellitus with hyperglycemia: Secondary | ICD-10-CM | POA: Diagnosis not present

## 2017-10-03 DIAGNOSIS — E782 Mixed hyperlipidemia: Secondary | ICD-10-CM | POA: Diagnosis not present

## 2017-10-03 DIAGNOSIS — I10 Essential (primary) hypertension: Secondary | ICD-10-CM | POA: Diagnosis not present

## 2017-10-05 DIAGNOSIS — I10 Essential (primary) hypertension: Secondary | ICD-10-CM | POA: Diagnosis not present

## 2017-10-05 DIAGNOSIS — E782 Mixed hyperlipidemia: Secondary | ICD-10-CM | POA: Diagnosis not present

## 2017-10-05 DIAGNOSIS — E1165 Type 2 diabetes mellitus with hyperglycemia: Secondary | ICD-10-CM | POA: Diagnosis not present

## 2017-10-05 DIAGNOSIS — G4733 Obstructive sleep apnea (adult) (pediatric): Secondary | ICD-10-CM | POA: Diagnosis not present

## 2017-10-05 DIAGNOSIS — Z6839 Body mass index (BMI) 39.0-39.9, adult: Secondary | ICD-10-CM | POA: Diagnosis not present

## 2017-10-05 DIAGNOSIS — R944 Abnormal results of kidney function studies: Secondary | ICD-10-CM | POA: Diagnosis not present

## 2017-10-05 DIAGNOSIS — R0602 Shortness of breath: Secondary | ICD-10-CM | POA: Diagnosis not present

## 2017-10-05 DIAGNOSIS — R0609 Other forms of dyspnea: Secondary | ICD-10-CM | POA: Diagnosis not present

## 2017-11-16 DIAGNOSIS — E119 Type 2 diabetes mellitus without complications: Secondary | ICD-10-CM | POA: Diagnosis not present

## 2017-11-16 DIAGNOSIS — Z23 Encounter for immunization: Secondary | ICD-10-CM | POA: Diagnosis not present

## 2017-11-16 DIAGNOSIS — B372 Candidiasis of skin and nail: Secondary | ICD-10-CM | POA: Diagnosis not present

## 2018-02-01 DIAGNOSIS — E782 Mixed hyperlipidemia: Secondary | ICD-10-CM | POA: Diagnosis not present

## 2018-02-01 DIAGNOSIS — Z6837 Body mass index (BMI) 37.0-37.9, adult: Secondary | ICD-10-CM | POA: Diagnosis not present

## 2018-02-01 DIAGNOSIS — I10 Essential (primary) hypertension: Secondary | ICD-10-CM | POA: Diagnosis not present

## 2018-02-01 DIAGNOSIS — R944 Abnormal results of kidney function studies: Secondary | ICD-10-CM | POA: Diagnosis not present

## 2018-02-01 DIAGNOSIS — Z6839 Body mass index (BMI) 39.0-39.9, adult: Secondary | ICD-10-CM | POA: Diagnosis not present

## 2018-02-01 DIAGNOSIS — E1165 Type 2 diabetes mellitus with hyperglycemia: Secondary | ICD-10-CM | POA: Diagnosis not present

## 2018-02-07 DIAGNOSIS — R0602 Shortness of breath: Secondary | ICD-10-CM | POA: Diagnosis not present

## 2018-02-07 DIAGNOSIS — E782 Mixed hyperlipidemia: Secondary | ICD-10-CM | POA: Diagnosis not present

## 2018-02-07 DIAGNOSIS — I10 Essential (primary) hypertension: Secondary | ICD-10-CM | POA: Diagnosis not present

## 2018-02-07 DIAGNOSIS — N183 Chronic kidney disease, stage 3 (moderate): Secondary | ICD-10-CM | POA: Diagnosis not present

## 2018-02-07 DIAGNOSIS — G4733 Obstructive sleep apnea (adult) (pediatric): Secondary | ICD-10-CM | POA: Diagnosis not present

## 2018-02-07 DIAGNOSIS — E1122 Type 2 diabetes mellitus with diabetic chronic kidney disease: Secondary | ICD-10-CM | POA: Diagnosis not present

## 2018-02-07 DIAGNOSIS — I5022 Chronic systolic (congestive) heart failure: Secondary | ICD-10-CM | POA: Diagnosis not present

## 2018-02-18 DIAGNOSIS — H40013 Open angle with borderline findings, low risk, bilateral: Secondary | ICD-10-CM | POA: Diagnosis not present

## 2018-06-10 DIAGNOSIS — E1169 Type 2 diabetes mellitus with other specified complication: Secondary | ICD-10-CM | POA: Diagnosis not present

## 2018-06-10 DIAGNOSIS — E785 Hyperlipidemia, unspecified: Secondary | ICD-10-CM | POA: Diagnosis not present

## 2018-06-10 DIAGNOSIS — E782 Mixed hyperlipidemia: Secondary | ICD-10-CM | POA: Diagnosis not present

## 2018-06-10 DIAGNOSIS — I10 Essential (primary) hypertension: Secondary | ICD-10-CM | POA: Diagnosis not present

## 2018-06-10 DIAGNOSIS — E1165 Type 2 diabetes mellitus with hyperglycemia: Secondary | ICD-10-CM | POA: Diagnosis not present

## 2018-06-10 DIAGNOSIS — R944 Abnormal results of kidney function studies: Secondary | ICD-10-CM | POA: Diagnosis not present

## 2018-06-13 DIAGNOSIS — N183 Chronic kidney disease, stage 3 (moderate): Secondary | ICD-10-CM | POA: Diagnosis not present

## 2018-06-13 DIAGNOSIS — E1122 Type 2 diabetes mellitus with diabetic chronic kidney disease: Secondary | ICD-10-CM | POA: Diagnosis not present

## 2018-06-13 DIAGNOSIS — I10 Essential (primary) hypertension: Secondary | ICD-10-CM | POA: Diagnosis not present

## 2018-06-13 DIAGNOSIS — E782 Mixed hyperlipidemia: Secondary | ICD-10-CM | POA: Diagnosis not present

## 2018-06-13 DIAGNOSIS — R0602 Shortness of breath: Secondary | ICD-10-CM | POA: Diagnosis not present

## 2018-06-13 DIAGNOSIS — G4733 Obstructive sleep apnea (adult) (pediatric): Secondary | ICD-10-CM | POA: Diagnosis not present

## 2018-06-13 DIAGNOSIS — I5022 Chronic systolic (congestive) heart failure: Secondary | ICD-10-CM | POA: Diagnosis not present

## 2018-09-02 IMAGING — US US ABDOMEN COMPLETE
1 series · 13 of 25 positions shown · non-contrast
Comparison: No recent prior.

CLINICAL DATA: Weight gain.  Abnormal renal function.

EXAM:
ABDOMEN ULTRASOUND COMPLETE

[Series 1: us abdomen complete · 0.22mm/px · 13 of 95 slices shown]
[im 1/95]
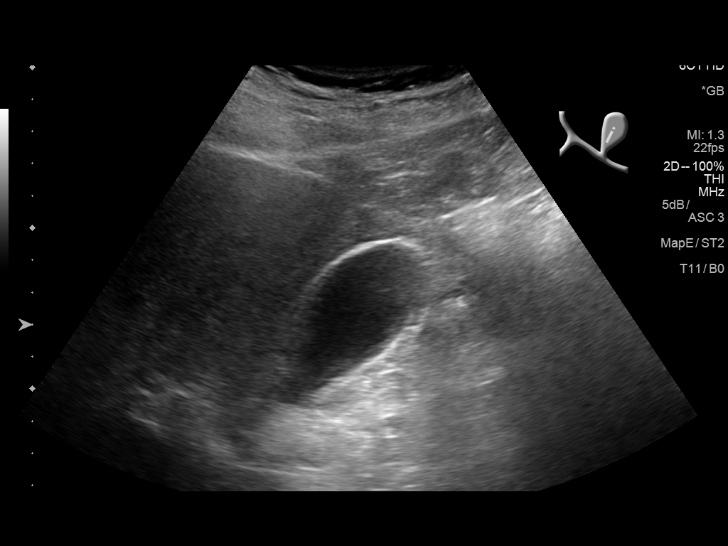
[im 8/95]
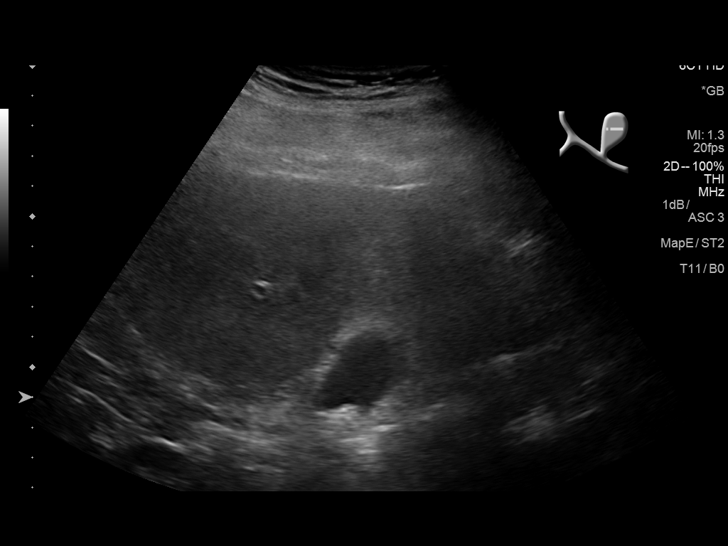
[im 16/95]
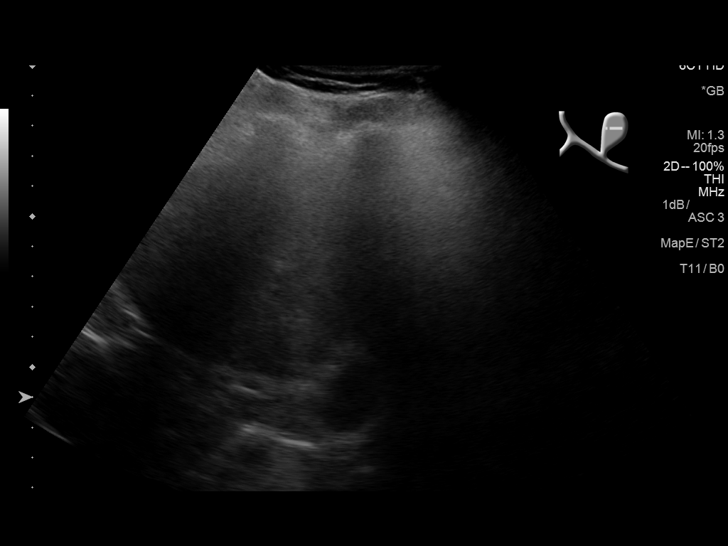
[im 24/95]
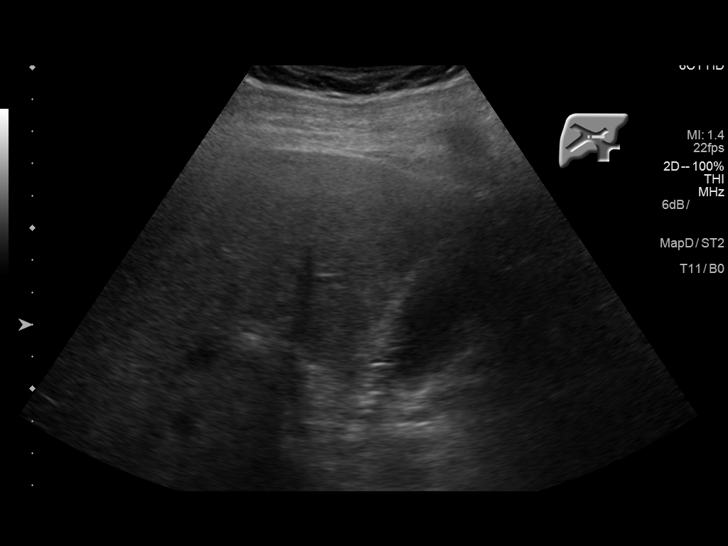
[im 32/95]
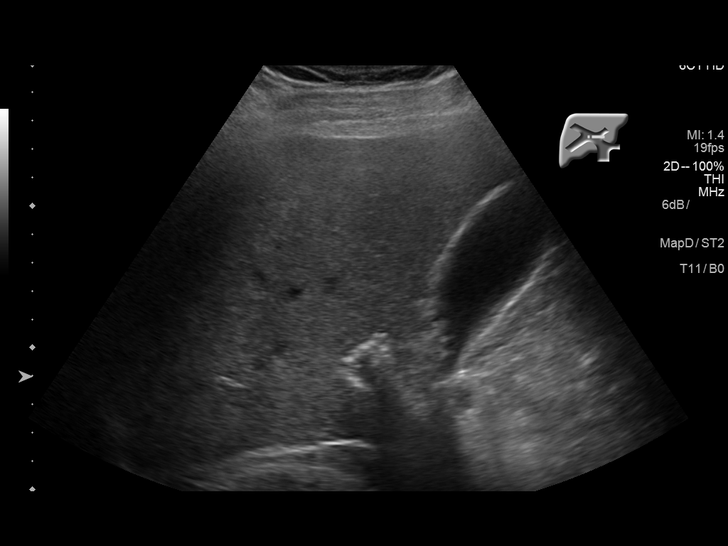
[im 40/95]
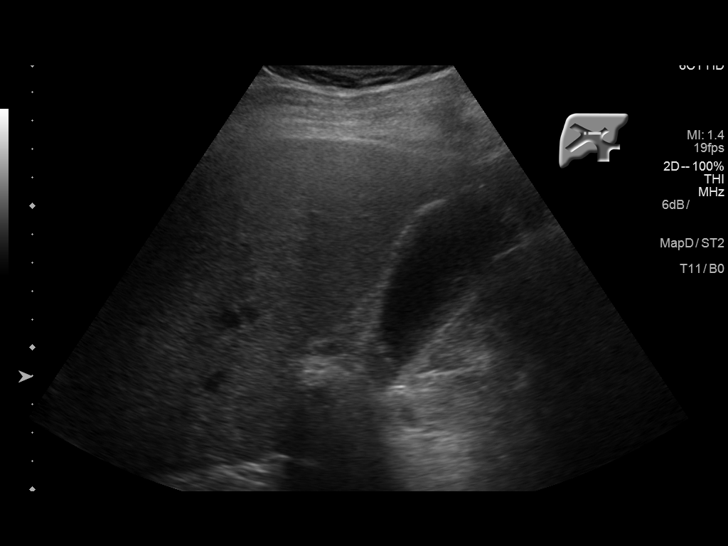
[im 48/95]
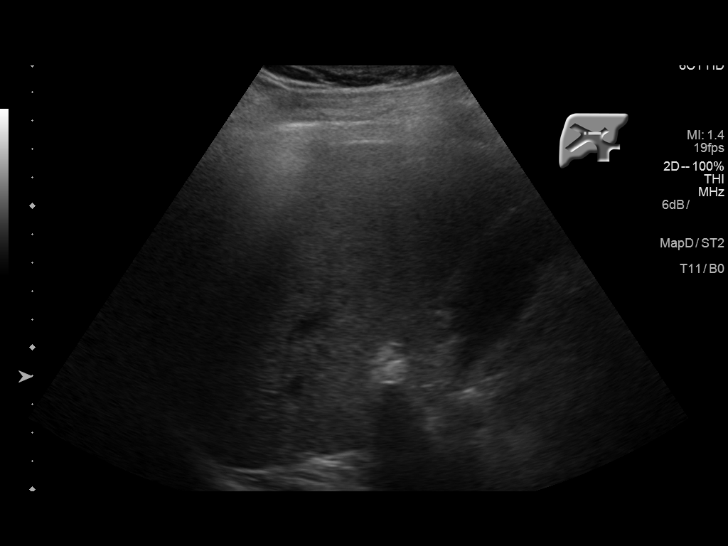
[im 55/95]
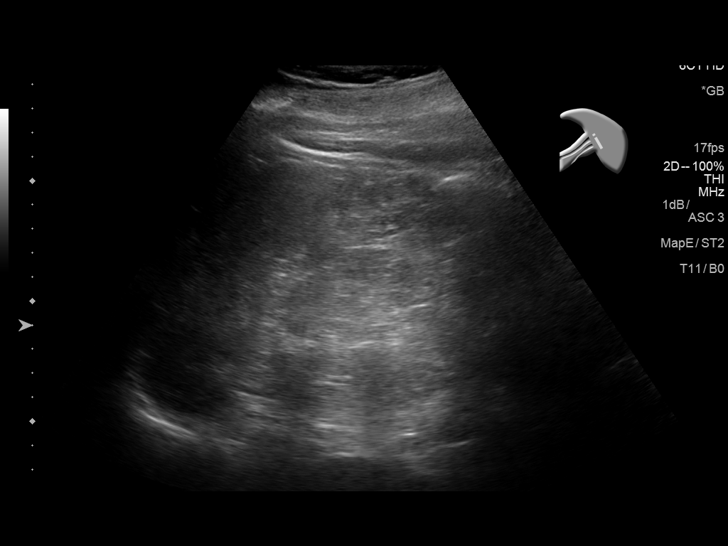
[im 63/95]
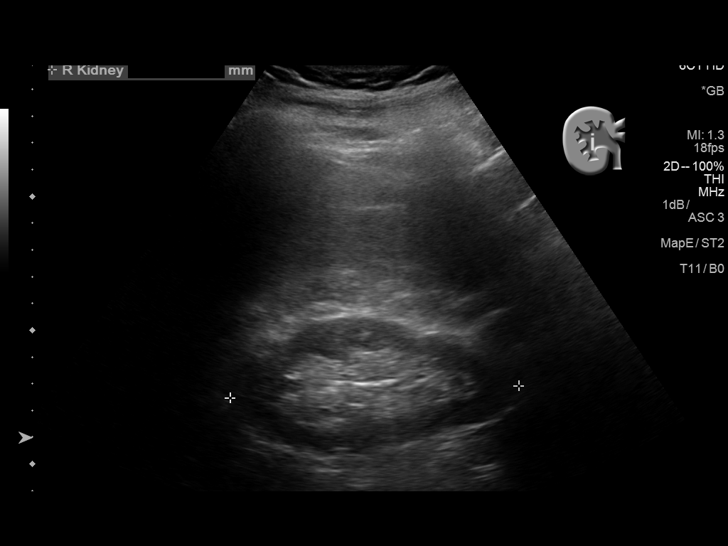
[im 71/95]
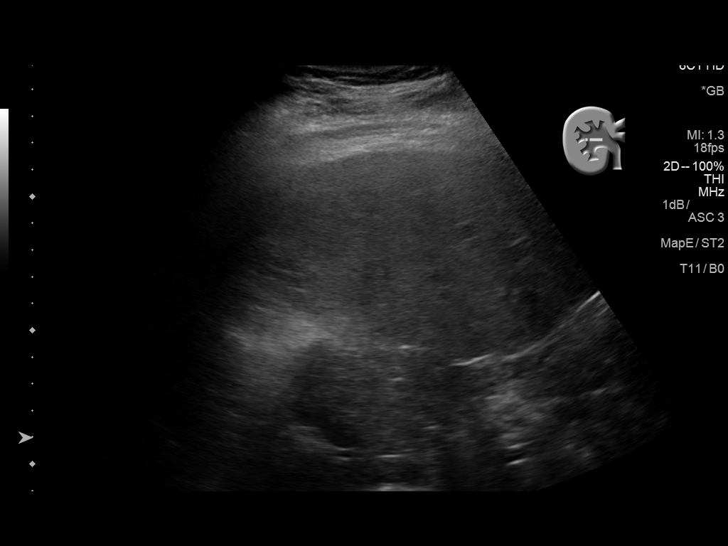
[im 79/95]
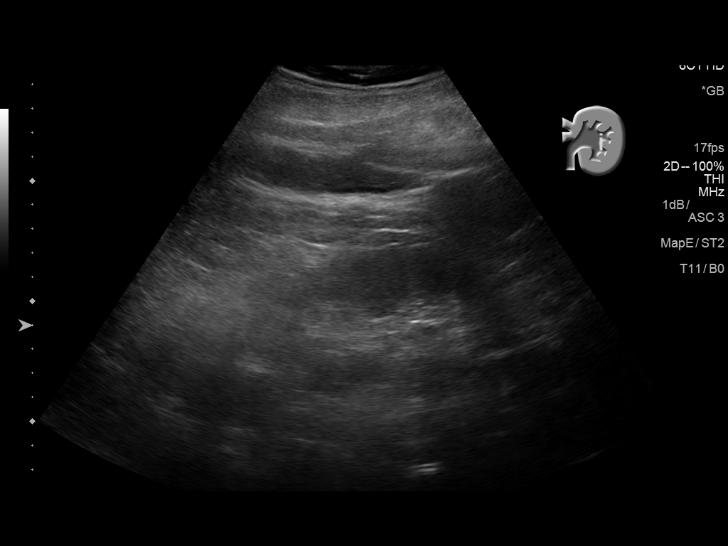
[im 87/95]
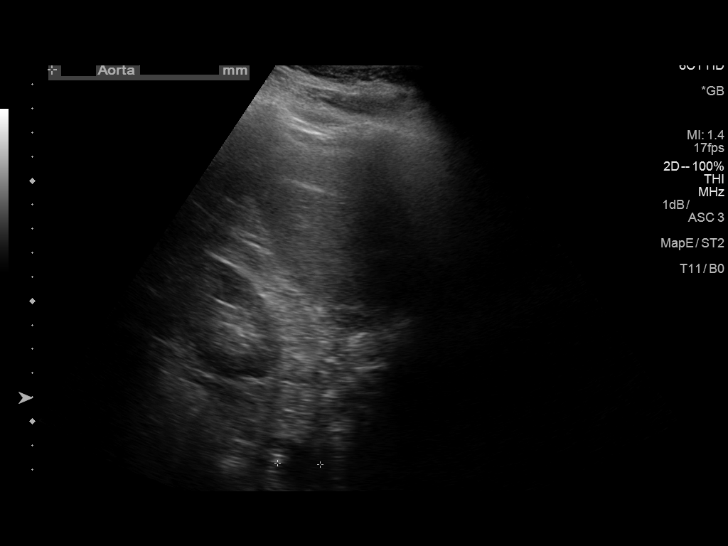
[im 95/95]
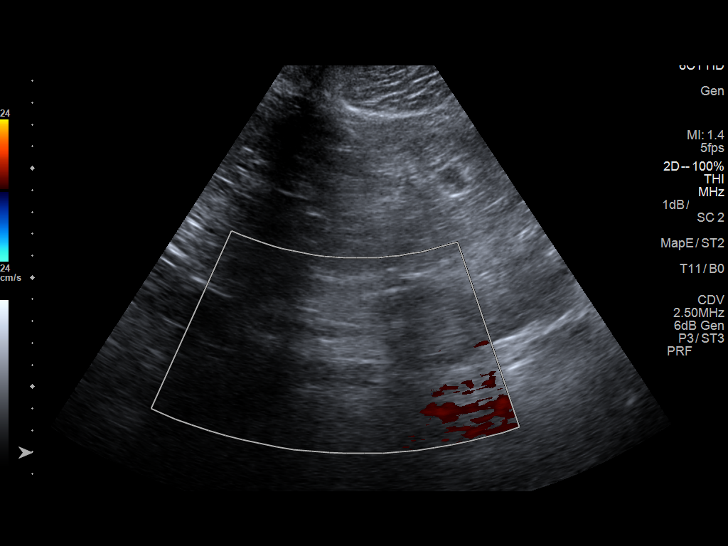

[13 of 25 positions shown; findings below may reference images not displayed]

FINDINGS: Gallbladder: Mild sludge and possible tiny stones noted. Gallbladder
wall thickness at 3 mm. This is slightly prominent. Cholecystitis
cannot be completely excluded. Negative Murphy sign. No
pericholecystic fluid collection.

Common bile duct: Diameter: 3.0 mm

Liver: Increased echogenicity consistent fatty infiltration and/or
hepatocellular disease. No focal hepatic abnormality identified.
Portal vein is patent on color Doppler imaging with normal direction
of blood flow towards the liver.

IVC: No abnormality visualized.

Pancreas: Not visualized.

Spleen: Size and appearance within normal limits.

Right Kidney: Length: 10.9 cm. Echogenicity within normal limits. No
mass or hydronephrosis visualized.

Left Kidney: Length: 11.1 cm. Echogenicity within normal limits. No
mass or hydronephrosis visualized.

Abdominal aorta: No aneurysm visualized.

Other findings: None.
IMPRESSION: 1. Mild sludge and possible gallstones noted. Gallbladder wall
thickness at 3 mm. This is slightly prominent. Cholecystitis cannot
be completely excluded. Negative Murphy sign. No pericholecystic
fluid collections.

2. Increased hepatic echogenicity consistent fatty infiltration
and/or hepatocellular disease. No focal hepatic abnormality
identified.

## 2018-09-11 DIAGNOSIS — H6090 Unspecified otitis externa, unspecified ear: Secondary | ICD-10-CM | POA: Diagnosis not present

## 2018-11-09 ENCOUNTER — Encounter

## 2018-12-02 ENCOUNTER — Other Ambulatory Visit: Payer: Self-pay

## 2018-12-02 ENCOUNTER — Ambulatory Visit (INDEPENDENT_AMBULATORY_CARE_PROVIDER_SITE_OTHER): Payer: Medicare Other | Admitting: Cardiovascular Disease

## 2018-12-02 ENCOUNTER — Encounter: Payer: Self-pay | Admitting: Cardiovascular Disease

## 2018-12-02 VITALS — BP 137/76 | HR 60 | Temp 97.9°F | Ht 72.0 in | Wt 253.0 lb

## 2018-12-02 DIAGNOSIS — G4733 Obstructive sleep apnea (adult) (pediatric): Secondary | ICD-10-CM

## 2018-12-02 DIAGNOSIS — I10 Essential (primary) hypertension: Secondary | ICD-10-CM | POA: Diagnosis not present

## 2018-12-02 DIAGNOSIS — Z9861 Coronary angioplasty status: Secondary | ICD-10-CM | POA: Diagnosis not present

## 2018-12-02 DIAGNOSIS — E668 Other obesity: Secondary | ICD-10-CM | POA: Diagnosis not present

## 2018-12-02 DIAGNOSIS — E119 Type 2 diabetes mellitus without complications: Secondary | ICD-10-CM | POA: Diagnosis not present

## 2018-12-02 DIAGNOSIS — I2510 Atherosclerotic heart disease of native coronary artery without angina pectoris: Secondary | ICD-10-CM

## 2018-12-02 DIAGNOSIS — E669 Obesity, unspecified: Secondary | ICD-10-CM

## 2018-12-02 NOTE — Patient Instructions (Signed)
Medication Instructions:  Your physician recommends that you continue on your current medications as directed. Please refer to the Current Medication list given to you today.  *If you need a refill on your cardiac medications before your next appointment, please call your pharmacy*  Lab Work: none If you have labs (blood work) drawn today and your tests are completely normal, you will receive your results only by: . MyChart Message (if you have MyChart) OR . A paper copy in the mail If you have any lab test that is abnormal or we need to change your treatment, we will call you to review the results.  Testing/Procedures: none  Follow-Up: At [facility], you and your health needs are our priority.  As part of our continuing mission to provide you with exceptional heart care, we have created designated Provider Care Teams.  These Care Teams include your primary Cardiologist (physician) and Advanced Practice Providers (APPs -  Physician Assistants and Nurse Practitioners) who all work together to provide you with the care you need, when you need it.  Your next appointment:   12 months  The format for your next appointment:   In Person  Provider:   You may see Dr. Kelly or one of the following Advanced Practice Providers on your designated Care Team:    Hao Meng, PA-C  Angela Duke, PA-C or   Krista Kroeger, PA-C    

## 2018-12-02 NOTE — Progress Notes (Signed)
Cardiology Office Note    Date:  12/02/2018   ID:  [name] [name], DOB 1950/04/12, MRN [id number]  PCP:  Celene Squibb, MD  Cardiologist:  Shelva Majestic, MD (sleep); Dr. Percival Spanish    History of Present Illness:  [name] [name] is a 68 y.o. male followed by Dr. Percival Spanish for cardiology care.  He has known CAD status post MI 2008 treated with RCA bare-metal stent.  In February 2019 his most recent catheterization revealed mild nonobstructive CAD with 20% proximal LAD stenosis before the takeoff of the first diagonal vessel with smooth 30 to 40% narrowing beyond the diagonal; normal left circumflex; and large dominant RCA with a widely patent RCA stent and minimal luminal irregularity of 10% proximally and distally.  He was  diagnosed with obstructive sleep apnea and on diagnostic polysomnogram of January 11, 2017 he was found to have severe OSA with an AHI of 34.2.  There was moderate oxygen desaturation to a nadir of 80%.  There was a significant positional component and his AHI with supine sleep was 105/H.  On March 05 2017 a CPAP titration study was performed and due to his severe positional component in supine sleep on the diagnostic study, and central events at higher pressure on CPAP therapy, BiPAP titration was recommended.  BiPAP therapy was initiated on May 22, 2017 with a [facility] as his DME.  He has been on a minimum EPAP pressure of 13 with a potential maximum IPAP pressure of 25.  A download from July 14, 2017 through August 12, 2017 shows excellent compliance at 100%.  He is averaging 7 hours and 20 minutes of sleep with BiPAP use.  His 95th percentile pressure is 20.4/16.4 with a maximum average pressure at 21.7/17.7.  Cysts since initiating BiPAP therapy, he has more energy.  He denies daytime sleepiness.  An Epworth Sleepiness Scale score was calculated in the office and this endorsed at 3. He was unaware of breakthrough snoring.  He was exercising and walks at least 3 miles per day.   Since I last saw him in August 2019 he has continued to do well.  He sees Dr. Wende Neighbors for his primary care.  He denies any recurrent anginal symptomatology.  He continues to use BiPAP with 100% compliance.  Typically he goes to bed between 10 and 11 PM and wakes up at approximately 4 AM.  He states he has never slept for significantly longer duration and 6 hours would be a very long sleep.  He has had some difficulty with the Velcro on several of his masks.  Oftentimes he takes a nap in the afternoon between 2 and 3 PM and does this typically because he is bored since he has been retired.  He does not use BiPAP during his nap and typically will sleep at most up to an hour.  A download was obtained from October 24 through December 01, 2018.  His minimum EPAP pressure is set at 13 with a maximum IPAP pressure of 25 and his 95th percentile pressure is 20.2/16 0.3 cm of water.  AHI is 3.1.  Dr. Wende Neighbors has been checking his laboratory.  He states his most recent lipid studies were excellent with a total cholesterol of 124.  In the past his LDL was 69 but I do not have his most recent result.  An Epworth Sleepiness Scale score was recalculated in the office today and this endorsed at 2 arguing against excessive daytime sleepiness.  He  presents for yearly evaluation.   Past Medical History:  Diagnosis Date  . Coronary artery disease    S/P stent Summer 2008, January 2016 left main 30% stenosis, LAD mid 50% stenosis, patent stent in the mid right coronary artery with ectasia proximal and distal to the stent. EF mildly reduced at 40% with global hypokinesis.  . Diabetes mellitus (HCC)    A1c 6.1 (11-2008)  . Erectile dysfunction   . Hyperlipidemia   . Hypertension   . MI (mitral incompetence)     Past Surgical History:  Procedure Laterality Date  . COLONOSCOPY     negative  . LEFT HEART CATH AND CORONARY ANGIOGRAPHY N/A 02/14/2017   Procedure: LEFT HEART CATH AND CORONARY ANGIOGRAPHY;  Surgeon: Troy Sine, MD;  Location: [facility];  Service: Cardiovascular;  Laterality: N/A;  . WRIST SURGERY     fractured wrist, Dr. Amedeo Plenty    Current Medications: Outpatient Medications Prior to Visit  Medication Sig Dispense Refill  . aspirin 81 MG tablet Take 81 mg by mouth daily.      Marland Kitchen atorvastatin (LIPITOR) 40 MG tablet TAKE 1 TABLET BY MOUTH ONCE DAILY 90 tablet 2  . furosemide (LASIX) 20 MG tablet Take 20 mg by mouth daily.  3  . JARDIANCE 10 MG TABS tablet     . lisinopril (PRINIVIL,ZESTRIL) 5 MG tablet TAKE 1 TABLET BY MOUTH ONCE DAILY 90 tablet 3  . metFORMIN (GLUCOPHAGE) 500 MG tablet TAKE 1 TABLET BY MOUTH ONCE DAILY 90 tablet 3  . metoprolol succinate (TOPROL-XL) 100 MG 24 hr tablet TAKE 1 TABLET BY MOUTH ONCE DAILY WITH  OR  IMMEDIATELY  FOLLOWING  A  MEAL 90 tablet 3  . nitroGLYCERIN (NITROSTAT) 0.4 MG SL tablet Place 1 tablet (0.4 mg total) under the tongue every 5 (five) minutes as needed. 25 tablet 2  . potassium chloride (K-DUR) 10 MEQ tablet   4   No facility-administered medications prior to visit.      Allergies:   Patient has no known allergies.   Social History   Socioeconomic History  . Marital status: Married    Spouse name: Not on file  . Number of children: 2  . Years of education: Not on file  . Highest education level: Not on file  Occupational History  . Occupation: Buyer, retail: [employer]  . Financial resource strain: Not on file  . Food insecurity    Worry: Not on file    Inability: Not on file  . Transportation needs    Medical: Not on file    Non-medical: Not on file  Tobacco Use  . Smoking status: Former Smoker    Quit date: 01/10/2003    Years since quitting: 15.9  . Smokeless tobacco: Never Used  Substance and Sexual Activity  . Alcohol use: Yes    Comment: rarely  . Drug use: No  . Sexual activity: Not on file  Lifestyle  . Physical activity    Days per week: Not on file    Minutes per session:  Not on file  . Stress: Not on file  Relationships  . Social Herbalist on phone: Not on file    Gets together: Not on file    Attends religious service: Not on file    Active member of club or organization: Not on file    Attends meetings of clubs or organizations: Not on file  Relationship status: Not on file  Other Topics Concern  . Not on file  Social History Narrative   06/14/2009 designated party form signed appointing wife Delmer Kowalski; Ok to leave message on home answering machine at [phone]     Family History:  The patient's family history includes CAD (age of onset: 35) in his brother; CAD (age of onset: 50) in his brother; Colon cancer in his father; Coronary artery disease (age of onset: 68) in his father; Hypertension in his brother; Pernicious anemia in his maternal grandmother; Stroke in his maternal grandfather; Stroke (age of onset: 14) in his mother.   ROS General: Negative; No fevers, chills, or night sweats;  HEENT: Negative; No changes in vision or hearing, sinus congestion, difficulty swallowing Pulmonary: Negative; No cough, wheezing, shortness of breath, hemoptysis Cardiovascular: Negative; No chest pain, presyncope, syncope, palpitations GI: Negative; No nausea, vomiting, diarrhea, or abdominal pain GU: Negative; No dysuria, hematuria, or difficulty voiding Musculoskeletal: Negative; no myalgias, joint pain, or weakness Hematologic/Oncology: Negative; no easy bruising, bleeding Endocrine: Negative; no heat/cold intolerance; no diabetes Neuro: Negative; no changes in balance, headaches Skin: Negative; No rashes or skin lesions Psychiatric: Negative; No behavioral problems, depression Sleep: Positive for OSA, now on BiPAP therapy.  No breakthrough snoring, daytime sleepiness, hypersomnolence, bruxism, restless legs, hypnogognic hallucinations, no cataplexy Other comprehensive 14 point system review is negative.   PHYSICAL EXAM:   VS:  BP  137/76   Pulse 60   Temp 97.9 F (36.6 C)   Ht 6' (1.829 m)   Wt 253 lb (114.8 kg)   SpO2 99%   BMI 34.31 kg/m     Repeat blood pressure was 128/74  Wt Readings from Last 3 Encounters:  12/02/18 253 lb (114.8 kg)  08/13/17 269 lb 12.8 oz (122.4 kg)  04/01/17 257 lb (116.6 kg)    General: Alert, oriented, no distress.  Skin: normal turgor, no rashes, warm and dry HEENT: Normocephalic, atraumatic. Pupils equal round and reactive to light; sclera anicteric; extraocular muscles intact; Nose without nasal septal hypertrophy Mouth/Parynx benign; Mallinpatti scale 3/4 Neck: No JVD, no carotid bruits; normal carotid upstroke Lungs: clear to ausculatation and percussion; no wheezing or rales Chest wall: without tenderness to palpitation Heart: PMI not displaced, RRR, s1 s2 normal, 1/6 systolic murmur, no diastolic murmur, no rubs, gallops, thrills, or heaves Abdomen: Central adiposity with prominent diastases recti; soft, nontender; no hepatosplenomehaly, BS+; abdominal aorta nontender and not dilated by palpation. Back: no CVA tenderness Pulses 2+ Musculoskeletal: full range of motion, normal strength, no joint deformities Extremities: no clubbing cyanosis or edema, Homan's sign negative  Neurologic: grossly nonfocal; Cranial nerves grossly wnl Psychologic: Normal mood and affect   Studies/Labs Reviewed:   EKG:  EKG is  ordered today.  ECG (independently read by me): Sinus Bradycardia at 53; no significant ST-T changes; no ectopy, normal interbvaLS  August 2019 ECG (independently read by me): Sinus bradycardia 54 bpm.  Normal intervals.  No significant ST-T changes.  No ectopy.  Recent Labs: BMP Latest Ref Rng & Units 02/09/2017 04/11/2016 08/29/2007  Glucose 65 - 99 mg/dL 91 93 92  BUN 8 - 27 mg/dL '17 12 10  ' Creatinine 0.76 - 1.27 mg/dL 1.30(H) 1.24 1.2  BUN/Creat Ratio 10 - '24 13 10 ' -  Sodium 134 - 144 mmol/L 140 139 139  Potassium 3.5 - 5.2 mmol/L 4.6 4.4 4.0  Chloride 96 - 106  mmol/L 100 101 106  CO2 20 - 29 mmol/L '24 22 28  ' Calcium  8.6 - 10.2 mg/dL 9.2 9.2 9.1     Hepatic Function Latest Ref Rng & Units 08/22/2010 02/14/2007 09/07/2006  Total Protein - - - 6.7  Albumin - - - 3.9  AST 0 - 37 U/L 24 47(H) 40(H)  ALT 0 - 53 units/L - 46 30  Alk Phosphatase - - - 69  Total Bilirubin - - - 1.4(H)  Bilirubin, Direct - - - 0.2    CBC Latest Ref Rng & Units 02/09/2017 04/11/2016 08/29/2007  WBC 3.4 - 10.8 x10E3/uL 6.0 7.3 8.4  Hemoglobin 13.0 - 17.7 g/dL 14.0 14.7 14.1  Hematocrit 37.5 - 51.0 % 42.5 43.3 39.9  Platelets 150 - 379 x10E3/uL 221 216 230   Lab Results  Component Value Date   MCV 87 02/09/2017   MCV 89 04/11/2016   MCV 92.2 08/29/2007   Lab Results  Component Value Date   TSH 4.010 02/09/2017   Lab Results  Component Value Date   HGBA1C 6.1 (H) 04/11/2016     BNP No results found for: BNP  ProBNP No results found for: PROBNP   Lipid Panel     Component Value Date/Time   CHOL 130 04/11/2016 0921   TRIG 122 04/11/2016 0921   HDL 37 (L) 04/11/2016 0921   CHOLHDL 3.5 04/11/2016 0921   CHOLHDL 4 08/22/2010 1101   VLDL 24.8 08/22/2010 1101   LDLCALC 69 04/11/2016 0921     RADIOLOGY: No results found.   Additional studies/ records that were reviewed today include:  I reviewed the patient's records from Dr. Percival Spanish as well as his sleep evaluations at [facility] sleep disorder center.  I obtained a download in the office  ASSESSMENT:    1. Essential hypertension   2. CAD in native artery   3. OSA (obstructive sleep apnea) on Bi-PAP   4. CAD S/P percutaneous coronary angioplasty   5. Moderate obesity   6. Non-insulin treated type 2 diabetes mellitus [facility])     PLAN:  [name] [name] is a 68 year old gentleman who is status post remote MI treated with RCA bare-metal stenting in 2008.  His most recent cardiac catheterization performed by me in February 2019 showed a widely patent stent in his RCA and mild nonobstructive CAD.   He was found to have severe obstructive sleep apnea with an AHI of 34.2/h and REM AHI of 31.2/h.  He had very severe sleep apnea with supine position at 105/h and there was significant oxygen desaturation to 80%.  Therapy has been initiated with BiPAP with a set up date of May 22, 2017.  When I saw him in August 2019, he was meeting compliance standards  with 100% usage and averaging 7 hours and 20 minutes of sleep per night.  His AHI was 3.2.  He was requiring high pressure with a 95th percentile pressure 20.4/16.4.  Over the past year, he continues to be compliant; however, his duration of usage has reduced to just under 5 hours.  His 95th percentile pressure is 20.2/16.3 with a maximum average pressure of 21.4/17.4.  I discussed with him more optimal sleep duration.  I again reviewed the effects of sleep apnea on normal sleep architecture as well as increased cardiovascular risk risk if sleep apnea is untreated.  His blood pressure today on repeat by me was stable at 128/74 on furosemide 20 mg, metoprolol 100 mg in addition to lisinopril 5 mg.  He is diabetic on Metformin and Jardiance.  With his established CAD, target LDL is  less than 70 and he continues to be on atorvastatin 40 mg.  I discussed weight loss.  He states his weight had gotten as low as 220 but it peaked at 280.  Since I saw him in August 2019 weight has decreased from 269 down to 253.  He will return to the primary care of Dr. Wende Neighbors.  Per Medicare guidelines I will see him in 1 year for reevaluation   Medication Adjustments/Labs and Tests Ordered: Current medicines are reviewed at length with the patient today.  Concerns regarding medicines are outlined above.  Medication changes, Labs and Tests ordered today are listed in the Patient Instructions below. Patient Instructions  Medication Instructions:  Your physician recommends that you continue on your current medications as directed. Please refer to the Current Medication list given  to you today.  *If you need a refill on your cardiac medications before your next appointment, please call your pharmacy*  Lab Work: none If you have labs (blood work) drawn today and your tests are completely normal, you will receive your results only by: Marland Kitchen MyChart Message (if you have MyChart) OR . A paper copy in the mail If you have any lab test that is abnormal or we need to change your treatment, we will call you to review the results.  Testing/Procedures: none  Follow-Up: At [facility], you and your health needs are our priority.  As part of our continuing mission to provide you with exceptional heart care, we have created designated Provider Care Teams.  These Care Teams include your primary Cardiologist (physician) and Advanced Practice Providers (APPs -  Physician Assistants and Nurse Practitioners) who all work together to provide you with the care you need, when you need it.  Your next appointment:   12 month(s)  The format for your next appointment:   In Person  Provider:   You may see Dr. Claiborne Billings or one of the following Advanced Practice Providers on your designated Care Team:    Almyra Deforest, PA-C  Fabian Sharp, [place] or   Roby Lofts, [place]      Signed, Shelva Majestic, MD  12/02/2018 12:33 PM    [facility] [address] [place], [place], [place]  [address] Phone: [phone]

## 2018-12-12 DIAGNOSIS — E1169 Type 2 diabetes mellitus with other specified complication: Secondary | ICD-10-CM | POA: Diagnosis not present

## 2018-12-12 DIAGNOSIS — E782 Mixed hyperlipidemia: Secondary | ICD-10-CM | POA: Diagnosis not present

## 2018-12-12 DIAGNOSIS — E1122 Type 2 diabetes mellitus with diabetic chronic kidney disease: Secondary | ICD-10-CM | POA: Diagnosis not present

## 2018-12-12 DIAGNOSIS — E119 Type 2 diabetes mellitus without complications: Secondary | ICD-10-CM | POA: Diagnosis not present

## 2018-12-12 DIAGNOSIS — E1165 Type 2 diabetes mellitus with hyperglycemia: Secondary | ICD-10-CM | POA: Diagnosis not present

## 2018-12-12 DIAGNOSIS — Z125 Encounter for screening for malignant neoplasm of prostate: Secondary | ICD-10-CM | POA: Diagnosis not present

## 2018-12-12 DIAGNOSIS — N183 Chronic kidney disease, stage 3 unspecified: Secondary | ICD-10-CM | POA: Diagnosis not present

## 2018-12-12 DIAGNOSIS — E785 Hyperlipidemia, unspecified: Secondary | ICD-10-CM | POA: Diagnosis not present

## 2018-12-12 DIAGNOSIS — I10 Essential (primary) hypertension: Secondary | ICD-10-CM | POA: Diagnosis not present

## 2018-12-19 DIAGNOSIS — Z23 Encounter for immunization: Secondary | ICD-10-CM | POA: Diagnosis not present

## 2018-12-19 DIAGNOSIS — I5022 Chronic systolic (congestive) heart failure: Secondary | ICD-10-CM | POA: Diagnosis not present

## 2018-12-19 DIAGNOSIS — G4733 Obstructive sleep apnea (adult) (pediatric): Secondary | ICD-10-CM | POA: Diagnosis not present

## 2018-12-19 DIAGNOSIS — I10 Essential (primary) hypertension: Secondary | ICD-10-CM | POA: Diagnosis not present

## 2018-12-19 DIAGNOSIS — N1831 Chronic kidney disease, stage 3a: Secondary | ICD-10-CM | POA: Diagnosis not present

## 2018-12-19 DIAGNOSIS — E1122 Type 2 diabetes mellitus with diabetic chronic kidney disease: Secondary | ICD-10-CM | POA: Diagnosis not present

## 2018-12-19 DIAGNOSIS — R0602 Shortness of breath: Secondary | ICD-10-CM | POA: Diagnosis not present

## 2018-12-19 DIAGNOSIS — E782 Mixed hyperlipidemia: Secondary | ICD-10-CM | POA: Diagnosis not present

## 2019-03-06 DIAGNOSIS — G4733 Obstructive sleep apnea (adult) (pediatric): Secondary | ICD-10-CM | POA: Diagnosis not present

## 2019-04-21 DIAGNOSIS — E785 Hyperlipidemia, unspecified: Secondary | ICD-10-CM | POA: Diagnosis not present

## 2019-04-21 DIAGNOSIS — E1165 Type 2 diabetes mellitus with hyperglycemia: Secondary | ICD-10-CM | POA: Diagnosis not present

## 2019-04-21 DIAGNOSIS — G4733 Obstructive sleep apnea (adult) (pediatric): Secondary | ICD-10-CM | POA: Diagnosis not present

## 2019-04-21 DIAGNOSIS — E1122 Type 2 diabetes mellitus with diabetic chronic kidney disease: Secondary | ICD-10-CM | POA: Diagnosis not present

## 2019-04-21 DIAGNOSIS — Z0000 Encounter for general adult medical examination without abnormal findings: Secondary | ICD-10-CM | POA: Diagnosis not present

## 2019-04-21 DIAGNOSIS — E1169 Type 2 diabetes mellitus with other specified complication: Secondary | ICD-10-CM | POA: Diagnosis not present

## 2019-04-21 DIAGNOSIS — E119 Type 2 diabetes mellitus without complications: Secondary | ICD-10-CM | POA: Diagnosis not present

## 2019-04-21 DIAGNOSIS — E782 Mixed hyperlipidemia: Secondary | ICD-10-CM | POA: Diagnosis not present

## 2019-04-24 DIAGNOSIS — G4733 Obstructive sleep apnea (adult) (pediatric): Secondary | ICD-10-CM | POA: Diagnosis not present

## 2019-04-24 DIAGNOSIS — N1831 Chronic kidney disease, stage 3a: Secondary | ICD-10-CM | POA: Diagnosis not present

## 2019-04-24 DIAGNOSIS — I10 Essential (primary) hypertension: Secondary | ICD-10-CM | POA: Diagnosis not present

## 2019-04-24 DIAGNOSIS — R0602 Shortness of breath: Secondary | ICD-10-CM | POA: Diagnosis not present

## 2019-04-24 DIAGNOSIS — E782 Mixed hyperlipidemia: Secondary | ICD-10-CM | POA: Diagnosis not present

## 2019-04-24 DIAGNOSIS — E1122 Type 2 diabetes mellitus with diabetic chronic kidney disease: Secondary | ICD-10-CM | POA: Diagnosis not present

## 2019-04-24 DIAGNOSIS — I5022 Chronic systolic (congestive) heart failure: Secondary | ICD-10-CM | POA: Diagnosis not present

## 2019-05-28 DIAGNOSIS — M189 Osteoarthritis of first carpometacarpal joint, unspecified: Secondary | ICD-10-CM | POA: Diagnosis not present

## 2019-05-28 DIAGNOSIS — M1812 Unilateral primary osteoarthritis of first carpometacarpal joint, left hand: Secondary | ICD-10-CM | POA: Diagnosis not present

## 2019-05-28 DIAGNOSIS — M79645 Pain in left finger(s): Secondary | ICD-10-CM | POA: Diagnosis not present

## 2019-05-28 DIAGNOSIS — M180 Bilateral primary osteoarthritis of first carpometacarpal joints: Secondary | ICD-10-CM | POA: Diagnosis not present

## 2019-07-17 DIAGNOSIS — G4733 Obstructive sleep apnea (adult) (pediatric): Secondary | ICD-10-CM | POA: Diagnosis not present

## 2019-10-23 DIAGNOSIS — E119 Type 2 diabetes mellitus without complications: Secondary | ICD-10-CM | POA: Diagnosis not present

## 2019-10-23 DIAGNOSIS — Q249 Congenital malformation of heart, unspecified: Secondary | ICD-10-CM | POA: Diagnosis not present

## 2019-10-23 DIAGNOSIS — E785 Hyperlipidemia, unspecified: Secondary | ICD-10-CM | POA: Diagnosis not present

## 2019-10-23 DIAGNOSIS — I2510 Atherosclerotic heart disease of native coronary artery without angina pectoris: Secondary | ICD-10-CM | POA: Diagnosis not present

## 2019-10-23 DIAGNOSIS — Z6839 Body mass index (BMI) 39.0-39.9, adult: Secondary | ICD-10-CM | POA: Diagnosis not present

## 2019-10-23 DIAGNOSIS — R0609 Other forms of dyspnea: Secondary | ICD-10-CM | POA: Diagnosis not present

## 2019-10-23 DIAGNOSIS — Z125 Encounter for screening for malignant neoplasm of prostate: Secondary | ICD-10-CM | POA: Diagnosis not present

## 2019-10-23 DIAGNOSIS — Z0000 Encounter for general adult medical examination without abnormal findings: Secondary | ICD-10-CM | POA: Diagnosis not present

## 2019-10-23 DIAGNOSIS — Z712 Person consulting for explanation of examination or test findings: Secondary | ICD-10-CM | POA: Diagnosis not present

## 2019-10-23 DIAGNOSIS — I5022 Chronic systolic (congestive) heart failure: Secondary | ICD-10-CM | POA: Diagnosis not present

## 2019-11-17 DIAGNOSIS — G4733 Obstructive sleep apnea (adult) (pediatric): Secondary | ICD-10-CM | POA: Diagnosis not present

## 2019-11-17 DIAGNOSIS — E1122 Type 2 diabetes mellitus with diabetic chronic kidney disease: Secondary | ICD-10-CM | POA: Diagnosis not present

## 2019-11-17 DIAGNOSIS — I5022 Chronic systolic (congestive) heart failure: Secondary | ICD-10-CM | POA: Diagnosis not present

## 2019-11-17 DIAGNOSIS — R0602 Shortness of breath: Secondary | ICD-10-CM | POA: Diagnosis not present

## 2019-11-17 DIAGNOSIS — E782 Mixed hyperlipidemia: Secondary | ICD-10-CM | POA: Diagnosis not present

## 2019-11-17 DIAGNOSIS — N1831 Chronic kidney disease, stage 3a: Secondary | ICD-10-CM | POA: Diagnosis not present

## 2019-11-17 DIAGNOSIS — I10 Essential (primary) hypertension: Secondary | ICD-10-CM | POA: Diagnosis not present

## 2019-11-17 DIAGNOSIS — Z23 Encounter for immunization: Secondary | ICD-10-CM | POA: Diagnosis not present

## 2019-11-20 ENCOUNTER — Encounter (INDEPENDENT_AMBULATORY_CARE_PROVIDER_SITE_OTHER): Payer: Self-pay | Admitting: *Deleted

## 2019-11-26 DIAGNOSIS — G4733 Obstructive sleep apnea (adult) (pediatric): Secondary | ICD-10-CM | POA: Diagnosis not present

## 2019-12-09 DIAGNOSIS — T169XXA Foreign body in ear, unspecified ear, initial encounter: Secondary | ICD-10-CM | POA: Diagnosis not present

## 2019-12-24 DIAGNOSIS — B3784 Candidal otitis externa: Secondary | ICD-10-CM | POA: Diagnosis not present

## 2019-12-24 DIAGNOSIS — T169XXA Foreign body in ear, unspecified ear, initial encounter: Secondary | ICD-10-CM | POA: Diagnosis not present

## 2020-03-04 ENCOUNTER — Ambulatory Visit (INDEPENDENT_AMBULATORY_CARE_PROVIDER_SITE_OTHER): Payer: Medicare Other | Admitting: Gastroenterology

## 2020-03-04 ENCOUNTER — Other Ambulatory Visit: Payer: Self-pay

## 2020-03-04 ENCOUNTER — Encounter (INDEPENDENT_AMBULATORY_CARE_PROVIDER_SITE_OTHER): Payer: Self-pay | Admitting: Gastroenterology

## 2020-03-04 DIAGNOSIS — K625 Hemorrhage of anus and rectum: Secondary | ICD-10-CM

## 2020-03-04 NOTE — Patient Instructions (Signed)
Schedule colonoscopy Will discuss hemorrhoid treatment options based on endoscopic findings

## 2020-03-04 NOTE — Progress Notes (Signed)
Katrinka Blazing, M.D. Gastroenterology & Hepatology [facility] [address] [place], [place] [address] Primary Care Physician: Benita Stabile, MD [address] [place] [address]  Referring MD: PCP  Chief Complaint: Rectal bleeding  History of Present Illness: [name] [name] is a 70 y.o. male with PMH CAD s/p stent placement and prior MI, DM, HTN, HLD, who presents for evaluation of rectal bleeding.  Patient reports that very occasionally he has episodes of rectal bleeding when wiping. This happens possibly 5 times a month but is very scant.  He believes this is related to his hemorrhoids as he knows that he has had these for many years which have mainly led to episodes of intermittent bleeding for at least 10 years.  Otherwise, the patient denies having any complaints but is interested in pursuing a screening colonoscopy.  The patient denies having any nausea, vomiting, dyschezia, fever, chills, hematochezia, melena, hematemesis, abdominal distention, abdominal pain, diarrhea, jaundice, pruritus or weight loss.  Last GGE:ZMOQH Last Colonoscopy:15-20 years ago - normal per patient, no reports available  FHx: neg for any gastrointestinal/liver disease, father cancer from the spine with mets to the colon Social: neg smoking, alcohol, occassionally smokes marijuana Surgical: no abdominal surgeries  Past Medical History: Past Medical History:  Diagnosis Date   Coronary artery disease    S/P stent Summer 2008, January 2016 left main 30% stenosis, LAD mid 50% stenosis, patent stent in the mid right coronary artery with ectasia proximal and distal to the stent. EF mildly reduced at 40% with global hypokinesis.   Diabetes mellitus (HCC)    A1c 6.1 (11-2008)   Erectile dysfunction    Hyperlipidemia    Hypertension    MI (mitral incompetence)     Past Surgical History: Past Surgical History:  Procedure Laterality Date    COLONOSCOPY     negative   LEFT HEART CATH AND CORONARY ANGIOGRAPHY N/A 02/14/2017   Procedure: LEFT HEART CATH AND CORONARY ANGIOGRAPHY;  Surgeon: Lennette Bihari, MD;  Location: [facility];  Service: Cardiovascular;  Laterality: N/A;   WRIST SURGERY     fractured wrist, Dr. Amanda Pea    Family History: Family History  Problem Relation Age of Onset   Pernicious anemia Maternal Grandmother    Coronary artery disease Father 42   Colon cancer Father        ?   Stroke Mother 73   Hypertension Brother    Stroke Maternal Grandfather    CAD Brother 19   CAD Brother 56    Social History: Social History   Tobacco Use  Smoking Status Former Smoker   Quit date: 01/10/2003   Years since quitting: 17.1  Smokeless Tobacco Never Used   Social History   Substance and Sexual Activity  Alcohol Use Yes   Comment: rarely   Social History   Substance and Sexual Activity  Drug Use No    Allergies: No Known Allergies  Medications: Current Outpatient Medications  Medication Sig Dispense Refill   atorvastatin (LIPITOR) 40 MG tablet TAKE 1 TABLET BY MOUTH ONCE DAILY 90 tablet 2   furosemide (LASIX) 20 MG tablet Take 20 mg by mouth daily.  3   JARDIANCE 10 MG TABS tablet      lisinopril (PRINIVIL,ZESTRIL) 5 MG tablet TAKE 1 TABLET BY MOUTH ONCE DAILY 90 tablet 3   metFORMIN (GLUCOPHAGE) 500 MG tablet TAKE 1 TABLET BY MOUTH ONCE DAILY 90 tablet 3   metoprolol  succinate (TOPROL-XL) 100 MG 24 hr tablet TAKE 1 TABLET BY MOUTH ONCE DAILY WITH  OR  IMMEDIATELY  FOLLOWING  A  MEAL 90 tablet 3   nitroGLYCERIN (NITROSTAT) 0.4 MG SL tablet Place 1 tablet (0.4 mg total) under the tongue every 5 (five) minutes as needed. 25 tablet 2   potassium chloride (K-DUR) 10 MEQ tablet   4   No current facility-administered medications for this visit.    Review of Systems: GENERAL: negative for malaise, night sweats HEENT: No changes in hearing or vision, no nose bleeds or other  nasal problems. NECK: Negative for lumps, goiter, pain and significant neck swelling RESPIRATORY: Negative for cough, wheezing CARDIOVASCULAR: Negative for chest pain, leg swelling, palpitations, orthopnea GI: SEE HPI MUSCULOSKELETAL: Negative for joint pain or swelling, back pain, and muscle pain. SKIN: Negative for lesions, rash PSYCH: Negative for sleep disturbance, mood disorder and recent psychosocial stressors. HEMATOLOGY Negative for prolonged bleeding, bruising easily, and swollen nodes. ENDOCRINE: Negative for cold or heat intolerance, polyuria, polydipsia and goiter. NEURO: negative for tremor, gait imbalance, syncope and seizures. The remainder of the review of systems is noncontributory.   Physical Exam: BP 135/72 (BP Location: Left Arm, Patient Position: Sitting, Cuff Size: Large)    Pulse 61    Temp 98 F (36.7 C) (Oral)    Ht 6' (1.829 m)    Wt 240 lb (108.9 kg)    BMI 32.55 kg/m  GENERAL: The patient is AO x3, in no acute distress. HEENT: Head is normocephalic and atraumatic. EOMI are intact. Mouth is well hydrated and without lesions. NECK: Supple. No masses LUNGS: Clear to auscultation. No presence of rhonchi/wheezing/rales. Adequate chest expansion HEART: RRR, normal s1 and s2. ABDOMEN: Soft, nontender, no guarding, no peritoneal signs, and nondistended. BS +. No masses. EXTREMITIES: Without any cyanosis, clubbing, rash, lesions or edema. NEUROLOGIC: AOx3, no focal motor deficit. SKIN: no jaundice, no rashes   Imaging/Labs: as above  I personally reviewed and interpreted the available labs, imaging and endoscopic files.  Impression and Plan: [name] [name] is a 70 y.o. male with PMH CAD s/p stent placement and prior MI, DM, HTN, HLD, who presents for evaluation of rectal bleeding.  The patient has had intermittent episodes of rectal bleeding chronically without any other symptomatology.  It is very likely her symptoms are related to hemorrhoidal bleeding but  they could also be related to diverticular bleeding, less likely due to malignancy.  I advised the patient that given that he does not have any constipation and he does not have to strain significantly to have bowel movement, there is little role in taking a laxative but either banding or a surgical approach could alleviate the frequency of his symptoms.  Nevertheless, the patient is due for colonoscopy for screening purposes which will be ordered today.  Patient the location of his hemorrhoids as assessed during his upcoming colonoscopy, will discuss with the patient the best treatment (banding versus hemorrhoidectomy).  -Schedule colonoscopy -Will discuss hemorrhoid treatment options based on endoscopic findings  All questions were answered.      Katrinka Blazing, MD Gastroenterology and Hepatology [facility]

## 2020-03-05 ENCOUNTER — Telehealth (INDEPENDENT_AMBULATORY_CARE_PROVIDER_SITE_OTHER): Payer: Self-pay

## 2020-03-05 ENCOUNTER — Other Ambulatory Visit (INDEPENDENT_AMBULATORY_CARE_PROVIDER_SITE_OTHER): Payer: Self-pay

## 2020-03-05 DIAGNOSIS — Z1211 Encounter for screening for malignant neoplasm of colon: Secondary | ICD-10-CM

## 2020-03-05 MED ORDER — SUPREP BOWEL PREP KIT 17.5-3.13-1.6 GM/177ML PO SOLN: 1.0000 | ORAL | 0 refills | Status: DC

## 2020-03-05 NOTE — Telephone Encounter (Signed)
LeighAnn Iness Pangilinan, CMA  

## 2020-03-08 ENCOUNTER — Encounter (INDEPENDENT_AMBULATORY_CARE_PROVIDER_SITE_OTHER): Payer: Self-pay

## 2020-03-12 DIAGNOSIS — Q249 Congenital malformation of heart, unspecified: Secondary | ICD-10-CM | POA: Diagnosis not present

## 2020-03-12 DIAGNOSIS — E785 Hyperlipidemia, unspecified: Secondary | ICD-10-CM | POA: Diagnosis not present

## 2020-03-12 DIAGNOSIS — I5022 Chronic systolic (congestive) heart failure: Secondary | ICD-10-CM | POA: Diagnosis not present

## 2020-03-12 DIAGNOSIS — E119 Type 2 diabetes mellitus without complications: Secondary | ICD-10-CM | POA: Diagnosis not present

## 2020-03-12 DIAGNOSIS — R0609 Other forms of dyspnea: Secondary | ICD-10-CM | POA: Diagnosis not present

## 2020-03-16 NOTE — Patient Instructions (Signed)
[name] [name]  03/16/2020     @PREFPERIOPPHARMACY @   Your procedure is scheduled on  03/19/2020.    Report to 05/19/2020 at  0745  A.M.   Call this number if you have problems the morning of surgery:  [phone]   Remember:  Follow the diet and pre instructions given to you by the office.                      Take these medicines the morning of surgery with A SIP OF WATER  Metoprolol.     Please brush your teeth.  Do not wear jewelry, make-up or nail polish.  Do not wear lotions, powders, or perfumes, or deodorant.  Do not shave 48 hours prior to surgery.  Men may shave face and neck.  Do not bring valuables to the hospital.  [facility] is not responsible for any belongings or valuables.  Contacts, dentures or bridgework may not be worn into surgery.  Leave your suitcase in the car.  After surgery it may be brought to your room.  For patients admitted to the hospital, discharge time will be determined by your treatment team.  Patients discharged the day of surgery will not be allowed to drive home and must have someone with them for 24 hours.   Special instructions:  DO NOT smoke tobacco or vape the morning of your procedure.  Please read over the following fact sheets that you were given. Anesthesia Post-op Instructions and Care and Recovery After Surgery       Colonoscopy, Adult, Care After This sheet gives you information about how to care for yourself after your procedure. Your health care provider may also give you more specific instructions. If you have problems or questions, contact your health care provider. What can I expect after the procedure? After the procedure, it is common to have:  A small amount of blood in your stool for 24 hours after the procedure.  Some gas.  Mild cramping or bloating of your abdomen. Follow these instructions at home: Eating and drinking  Drink enough fluid to keep your urine pale yellow.  Follow  instructions from your health care provider about eating or drinking restrictions.  Resume your normal diet as instructed by your health care provider. Avoid heavy or fried foods that are hard to digest.   Activity  Rest as told by your health care provider.  Avoid sitting for a long time without moving. Get up to take short walks every 1-2 hours. This is important to improve blood flow and breathing. Ask for help if you feel weak or unsteady.  Return to your normal activities as told by your health care provider. Ask your health care provider what activities are safe for you. Managing cramping and bloating  Try walking around when you have cramps or feel bloated.  Apply heat to your abdomen as told by your health care provider. Use the heat source that your health care provider recommends, such as a moist heat pack or a heating pad. ? Place a towel between your skin and the heat source. ? Leave the heat on for 20-30 minutes. ? Remove the heat if your skin turns bright red. This is especially important if you are unable to feel pain, heat, or cold. You may have a greater risk of getting burned.   General instructions  If you were given a sedative during the procedure, it can affect you  for several hours. Do not drive or operate machinery until your health care provider says that it is safe.  For the first 24 hours after the procedure: ? Do not sign important documents. ? Do not drink alcohol. ? Do your regular daily activities at a slower pace than normal. ? Eat soft foods that are easy to digest.  Take over-the-counter and prescription medicines only as told by your health care provider.  Keep all follow-up visits as told by your health care provider. This is important. Contact a health care provider if:  You have blood in your stool 2-3 days after the procedure. Get help right away if you have:  More than a small spotting of blood in your stool.  Large blood clots in your  stool.  Swelling of your abdomen.  Nausea or vomiting.  A fever.  Increasing pain in your abdomen that is not relieved with medicine. Summary  After the procedure, it is common to have a small amount of blood in your stool. You may also have mild cramping and bloating of your abdomen.  If you were given a sedative during the procedure, it can affect you for several hours. Do not drive or operate machinery until your health care provider says that it is safe.  Get help right away if you have a lot of blood in your stool, nausea or vomiting, a fever, or increased pain in your abdomen. This information is not intended to replace advice given to you by your health care provider. Make sure you discuss any questions you have with your health care provider. Document Revised: 12/20/2018 Document Reviewed: 07/22/2018 Elsevier Patient Education  2021 [employer] This sheet gives you information about how to care for yourself after your procedure. Your health care provider may also give you more specific instructions. If you have problems or questions, contact your health care provider. What can I expect after the procedure? After the procedure, it is common to have:  Tiredness.  Forgetfulness about what happened after the procedure.  Impaired judgment for important decisions.  Nausea or vomiting.  Some difficulty with balance. Follow these instructions at home: For the time period you were told by your health care provider:  Rest as needed.  Do not participate in activities where you could fall or become injured.  Do not drive or use machinery.  Do not drink alcohol.  Do not take sleeping pills or medicines that cause drowsiness.  Do not make important decisions or sign legal documents.  Do not take care of children on your own.      Eating and drinking  Follow the diet that is recommended by your health care provider.  Drink  enough fluid to keep your urine pale yellow.  If you vomit: ? Drink water, juice, or soup when you can drink without vomiting. ? Make sure you have little or no nausea before eating solid foods. General instructions  Have a responsible adult stay with you for the time you are told. It is important to have someone help care for you until you are awake and alert.  Take over-the-counter and prescription medicines only as told by your health care provider.  If you have sleep apnea, surgery and certain medicines can increase your risk for breathing problems. Follow instructions from your health care provider about wearing your sleep device: ? Anytime you are sleeping, including during daytime naps. ? While taking prescription pain medicines, sleeping medicines, or medicines that  make you drowsy.  Avoid smoking.  Keep all follow-up visits as told by your health care provider. This is important. Contact a health care provider if:  You keep feeling nauseous or you keep vomiting.  You feel light-headed.  You are still sleepy or having trouble with balance after 24 hours.  You develop a rash.  You have a fever.  You have redness or swelling around the IV site. Get help right away if:  You have trouble breathing.  You have new-onset confusion at home. Summary  For several hours after your procedure, you may feel tired. You may also be forgetful and have poor judgment.  Have a responsible adult stay with you for the time you are told. It is important to have someone help care for you until you are awake and alert.  Rest as told. Do not drive or operate machinery. Do not drink alcohol or take sleeping pills.  Get help right away if you have trouble breathing, or if you suddenly become confused. This information is not intended to replace advice given to you by your health care provider. Make sure you discuss any questions you have with your health care provider. Document Revised:  09/11/2019 Document Reviewed: 11/28/2018 Elsevier Patient Education  2021 [employer].

## 2020-03-17 ENCOUNTER — Encounter (HOSPITAL_COMMUNITY)
Admission: RE | Admit: 2020-03-17 | Discharge: 2020-03-17 | Disposition: A | Discharge status: Home / Self Care | Payer: Medicare Other | Source: Ambulatory Visit | Attending: Gastroenterology | Admitting: Gastroenterology

## 2020-03-17 ENCOUNTER — Other Ambulatory Visit: Payer: Self-pay

## 2020-03-17 ENCOUNTER — Other Ambulatory Visit (HOSPITAL_COMMUNITY)
Admission: RE | Admit: 2020-03-17 | Discharge: 2020-03-17 | Disposition: A | Discharge status: Home / Self Care | Payer: Medicare Other | Source: Ambulatory Visit | Attending: Gastroenterology | Admitting: Gastroenterology

## 2020-03-17 ENCOUNTER — Encounter (HOSPITAL_COMMUNITY): Payer: Self-pay

## 2020-03-17 ENCOUNTER — Telehealth: Payer: Self-pay | Admitting: Cardiovascular Disease

## 2020-03-17 DIAGNOSIS — Z20822 Contact with and (suspected) exposure to covid-19: Secondary | ICD-10-CM | POA: Insufficient documentation

## 2020-03-17 DIAGNOSIS — Z01818 Encounter for other preprocedural examination: Secondary | ICD-10-CM | POA: Diagnosis not present

## 2020-03-17 DIAGNOSIS — H6504 Acute serous otitis media, recurrent, right ear: Secondary | ICD-10-CM | POA: Diagnosis not present

## 2020-03-17 DIAGNOSIS — H60391 Other infective otitis externa, right ear: Secondary | ICD-10-CM | POA: Diagnosis not present

## 2020-03-17 HISTORY — DX: Sleep apnea, unspecified: G47.30

## 2020-03-17 LAB — BASIC METABOLIC PANEL
Anion gap: 9 (ref 5–15)
BUN: 18 mg/dL (ref 8–23)
CO2: 21 mmol/L — ABNORMAL LOW (ref 22–32)
Calcium: 8.8 mg/dL — ABNORMAL LOW (ref 8.9–10.3)
Chloride: 106 mmol/L (ref 98–111)
Creatinine, Ser: 1.18 mg/dL (ref 0.61–1.24)
GFR, Estimated: >60 mL/min (ref >60)
Glucose, Bld: 95 mg/dL (ref 70–99)
Potassium: 3.8 mmol/L (ref 3.5–5.1)
Sodium: 136 mmol/L (ref 135–145)

## 2020-03-17 LAB — RAPID URINE DRUG SCREEN, HOSP PERFORMED
Amphetamines: NOT DETECTED
Barbiturates: NOT DETECTED
Benzodiazepines: NOT DETECTED
Cocaine: NOT DETECTED
Opiates: NOT DETECTED
Tetrahydrocannabinol: POSITIVE — AB

## 2020-03-17 NOTE — Telephone Encounter (Signed)
   Primary Cardiologist: Nicki Guadalajara, MD  Chart reviewed as part of pre-operative protocol coverage. Because of [name] [name]'s past medical history and time since last visit, he will require a follow-up visit in order to better assess preoperative cardiovascular risk.  Request was received on 03/17/20 for a procedure 03/19/20. Please let them know we have not seen this patient in 2 years and cardiac clearance will require an appt.  If the procedure is urgent, please call our office directly to speak with the preop APP that day to discuss with primary cardiologist.    Pre-op covering staff: - Please schedule appointment and call patient to inform them. If patient already had an upcoming appointment within acceptable timeframe, please add "pre-op clearance" to the appointment notes so provider is aware. - Please contact requesting surgeon's office via preferred method (i.e, phone, fax) to inform them of need for appointment prior to surgery.  If applicable, this message will also be routed to pharmacy pool and/or primary cardiologist for input on holding anticoagulant/antiplatelet agent as requested below so that this information is available to the clearing provider at time of patient's appointment.   Roe Rutherford Duke, PA  03/17/2020, 7:29 PM

## 2020-03-17 NOTE — Telephone Encounter (Signed)
I'm leaving this request in the box to make sure they don't need an urgent clearance.

## 2020-03-17 NOTE — Telephone Encounter (Signed)
   [facility] Pre-operative Risk Assessment    Request for surgical clearance:  1. What type of surgery is being performed? Colonoscopy   2. When is this surgery scheduled? 03/19/20   3. What type of clearance is required (medical clearance vs. Pharmacy clearance to hold med vs. Both)? Medical Clearance  4. Are there any medications that need to be held prior to surgery and how long?  No   5. Practice name and name of physician performing surgery? [facility] with [facility]  Dr. Jenetta Downer  6. What is your office phone number? [phone] / [phone]    7.   What is your office fax number? [phone]  8.   Anesthesia type (None, local, MAC, general) ? Propofol   Zara Council 03/17/2020, 4:40 PM  _________________________________________________________________

## 2020-03-18 LAB — SARS CORONAVIRUS 2 (TAT 6-24 HRS): SARS Coronavirus 2: NEGATIVE

## 2020-03-18 NOTE — Telephone Encounter (Signed)
Patient contacted as part of the preoperative protocol.  He has not been seen in the office since 12/02/2018 by Dr. Tresa Endo.  I explained that he would need to be seen so that a preoperative cardiac evaluation may be done prior to providing cardiac clearance to the requesting office.  He reported that the colonoscopy is not urgent/emergent.  He is not having any colorectal issues at this time.  He did express frustration due to the fact that he was already preparing/doing prep for the procedure.  I informed him that scheduling will be calling him to make an appointment.

## 2020-03-18 NOTE — OR Nursing (Signed)
Patient called this am to cancel procedure scheduled on 03/19/2020. No reason given . He was instructed to call office.

## 2020-03-18 NOTE — Telephone Encounter (Signed)
Called patient and scheduled appointment for cardiac clearance, pt expressed dis-satisfaction that he needs appointment for clearance and he has already started the prep and fasting.  Explained that we just received the cardiac clearance yesterday from his provider. Scheduled 1st available appointment  04-19-20. Pt expresses understanding and states that he will call requesting provider and re-schedule

## 2020-03-19 ENCOUNTER — Encounter (HOSPITAL_COMMUNITY): Admission: RE | Payer: Self-pay | Source: Home / Self Care

## 2020-03-19 ENCOUNTER — Ambulatory Visit (HOSPITAL_COMMUNITY): Admission: RE | Admit: 2020-03-19 | Payer: Medicare Other | Source: Home / Self Care | Admitting: Gastroenterology

## 2020-03-19 SURGERY — COLONOSCOPY WITH PROPOFOL
Anesthesia: Monitor Anesthesia Care

## 2020-03-22 DIAGNOSIS — R0602 Shortness of breath: Secondary | ICD-10-CM | POA: Diagnosis not present

## 2020-03-22 DIAGNOSIS — I5022 Chronic systolic (congestive) heart failure: Secondary | ICD-10-CM | POA: Diagnosis not present

## 2020-03-22 DIAGNOSIS — Z23 Encounter for immunization: Secondary | ICD-10-CM | POA: Diagnosis not present

## 2020-03-22 DIAGNOSIS — H6121 Impacted cerumen, right ear: Secondary | ICD-10-CM | POA: Diagnosis not present

## 2020-03-22 DIAGNOSIS — E782 Mixed hyperlipidemia: Secondary | ICD-10-CM | POA: Diagnosis not present

## 2020-03-22 DIAGNOSIS — G4733 Obstructive sleep apnea (adult) (pediatric): Secondary | ICD-10-CM | POA: Diagnosis not present

## 2020-03-22 DIAGNOSIS — I10 Essential (primary) hypertension: Secondary | ICD-10-CM | POA: Diagnosis not present

## 2020-03-22 DIAGNOSIS — N1831 Chronic kidney disease, stage 3a: Secondary | ICD-10-CM | POA: Diagnosis not present

## 2020-03-22 DIAGNOSIS — E1122 Type 2 diabetes mellitus with diabetic chronic kidney disease: Secondary | ICD-10-CM | POA: Diagnosis not present

## 2020-03-22 DIAGNOSIS — M79645 Pain in left finger(s): Secondary | ICD-10-CM | POA: Diagnosis not present

## 2020-03-29 DIAGNOSIS — H60331 Swimmer's ear, right ear: Secondary | ICD-10-CM | POA: Diagnosis not present

## 2020-04-12 NOTE — Progress Notes (Signed)
Cardiology Office Note:    Date:  04/20/2020   ID:  [name] [name], DOB 1950-12-22, MRN [id number]  PCP:  Celene Squibb, MD  Cardiologist:  Shelva Majestic, MD  Electrophysiologist:  None   Referring MD: Celene Squibb, MD   Chief Complaint: pre-op evaluation for colonoscopy  History of Present Illness:    [name] [name] is a 70 y.o. male with a history of CAD with remote MI in 2008 s/p BMS to RCA, hypertension, hyperlipidemia, diabetes mellitus, and obstructive sleep apnea on BiPAP who is followed by Dr. Percival Spanish and  Dr. Claiborne Billings (for sleep medicine) and presents today for pre-op evaluation for upcoming colonoscopy.   Patient has history of CAD with remote BMS to RCA in 2008 in the setting of MI. Most recent Echo in 04/2016 showed LVEF of 45-50% with hypokinesis of the inferior myocardium and mild MR. Myoview was ordered in 01/2017 for further evaluation of dyspnea on exertion and was abnormal. Therefore, patient underwent LHC in 02/2017 which showed mild non-obstructive CAD with widely patent stent in RCA. Patient also has obstructive sleep apnea and is on BIPAP. This is managed by Dr. Claiborne Billings. Patient has not been seen by our office since 11/2018 when he was seen by Dr. Claiborne Billings for follow-up of sleep apnea.  Patient presents today for pre-op evaluation for upcoming colonoscopy. Patient doing very well from a cardiac standpoint. He denies any chest pain or shortness of breath. He uses a CPAP machine for sleep apnea. As long as he uses this, no problems with any nocturnal shortness of breath. No orthopnea or PND. No lower extremity edema. No palpitations. He did have a recent ear infection and sinus infection and had some "head fogginess" with this but no real dizziness. No near syncope or syncope. He stays very activity and walks 4-5 miles per day without any problems.   Past Medical History:  Diagnosis Date  . Coronary artery disease    S/P stent Summer 2008, January 2016 left main 30% stenosis, LAD mid  50% stenosis, patent stent in the mid right coronary artery with ectasia proximal and distal to the stent. EF mildly reduced at 40% with global hypokinesis.  . Diabetes mellitus (HCC)    A1c 6.1 (11-2008)  . Erectile dysfunction   . Hyperlipidemia   . Hypertension   . MI (mitral incompetence)   . Myocardial infarction ([place]) 2016  . Sleep apnea     Past Surgical History:  Procedure Laterality Date  . COLONOSCOPY  2012   negative  . LEFT HEART CATH AND CORONARY ANGIOGRAPHY N/A 02/14/2017   Procedure: LEFT HEART CATH AND CORONARY ANGIOGRAPHY;  Surgeon: Troy Sine, MD;  Location: [facility];  Service: Cardiovascular;  Laterality: N/A;  . WRIST SURGERY     fractured wrist, Dr. Amedeo Plenty    Current Medications: Current Meds  Medication Sig  . atorvastatin (LIPITOR) 40 MG tablet TAKE 1 TABLET BY MOUTH ONCE DAILY (Patient taking differently: Take 40 mg by mouth daily.)  . furosemide (LASIX) 20 MG tablet Take 20 mg by mouth daily.  Marland Kitchen lisinopril (PRINIVIL,ZESTRIL) 5 MG tablet TAKE 1 TABLET BY MOUTH ONCE DAILY (Patient taking differently: Take 5 mg by mouth daily.)  . metFORMIN (GLUCOPHAGE) 500 MG tablet TAKE 1 TABLET BY MOUTH ONCE DAILY (Patient taking differently: Take 500 mg by mouth daily.)  . metoprolol succinate (TOPROL-XL) 100 MG 24 hr tablet TAKE 1 TABLET BY MOUTH ONCE DAILY WITH  OR  IMMEDIATELY  FOLLOWING  A  MEAL (Patient taking differently: Take 100 mg by mouth daily.)  . Na Sulfate-K Sulfate-Mg Sulf (SUPREP BOWEL PREP KIT) 17.5-3.13-1.6 GM/177ML SOLN Take 1 kit by mouth as directed.  . potassium chloride (K-DUR) 10 MEQ tablet Take 10 mEq by mouth daily.  . [DISCONTINUED] nitroGLYCERIN (NITROSTAT) 0.4 MG SL tablet Place 1 tablet (0.4 mg total) under the tongue every 5 (five) minutes as needed.     Allergies:   Patient has no known allergies.   Social History   Socioeconomic History  . Marital status: Married    Spouse name: Not on file  . Number of children: 2  .  Years of education: Not on file  . Highest education level: Not on file  Occupational History  . Occupation: Buyer, retail: [employer]  Tobacco Use  . Smoking status: Former Smoker    Quit date: 01/10/2003    Years since quitting: 17.2  . Smokeless tobacco: Never Used  Vaping Use  . Vaping Use: Never used  Substance and Sexual Activity  . Alcohol use: Yes    Comment: rarely  . Drug use: Yes    Types: Marijuana  . Sexual activity: Not on file  Other Topics Concern  . Not on file  Social History Narrative   06/14/2009 designated party form signed appointing wife Merrell Borsuk; Ok to leave message on home answering machine at [phone]   Social Determinants of Health   Financial Resource Strain: Not on file  Food Insecurity: Not on file  Transportation Needs: Not on file  Physical Activity: Not on file  Stress: Not on file  Social Connections: Not on file     Family History: The patient's family history includes CAD (age of onset: 76) in his brother; CAD (age of onset: 43) in his brother; Colon cancer in his father; Coronary artery disease (age of onset: 71) in his father; Hypertension in his brother; Pernicious anemia in his maternal grandmother; Stroke in his maternal grandfather; Stroke (age of onset: 47) in his mother.  ROS:   Please see the history of present illness.     EKGs/Labs/Other Studies Reviewed:    The following studies were reviewed today:  Echocardiogram 04/11/2016: Study Conclusions: - Left ventricle: The cavity size was normal. Systolic function was  mildly reduced. The estimated ejection fraction was in the range  of 45% to 50%. Parasternal long axis and apical images suggestive  of hypokinesis of the inferior myocardium. Findings consistent  with left ventricular diastolic dysfunction.  - Mitral valve: There was mild regurgitation.  - Left atrium: The atrium was at the upper limits of normal in  size.  - Right ventricle:  Systolic function was normal.  - Pulmonary arteries: Systolic pressure was within the normal  range.   Impressions: - Challenging image quality.  _______________  Echocardiogram 01/16/2017:  The left ventricular ejection fraction is mildly decreased (45-54%).  Nuclear stress EF: 51%.  There was no ST segment deviation noted during stress.  Defect 1: There is a medium defect of moderate severity present in the basal inferior, mid inferior and apical inferior location.  Findings consistent with ischemia.  This is an intermediate risk study.   There is medium size, moderate severity, reversible defect in the basal, mid and apical inferior walls consistent with ischemia in the RCA territory.  _______________  Left Heart Catheterization 02/14/2017:  Prox RCA lesion is 10% stenosed.  Prox RCA to Mid RCA lesion is 5% stenosed.  Dist RCA  lesion is 10% stenosed.  Prox LAD to Mid LAD lesion is 40% stenosed.  Prox LAD lesion is 20% stenosed.  The left ventricular systolic function is normal.  LV end diastolic pressure is normal.   Normal LV function without focal segmental wall motion abnormalities.  Mild nonobstructive CAD with 20% proximal LAD stenosis before the takeoff of the first diagonal vessel with smooth 30 -40% narrowing in the LAD beyond this diagonal vessel; normal left circumflex coronary artery; and large dominant RCA with a widely patent mid RCA stent and minimal luminal irregularity of 10% proximally and distally.  Recommendation: Medical therapy.  The patient will follow-up with Dr. Percival Spanish.  EKG:  EKG ordered today. EKG personally reviewed and demonstrates sinus bradycardia, rate 52 bpm, with no acute ST/T changes. Normal axis. Normal PR and QRS intervals. QTc 414 ms.  Recent Labs: 03/17/2020: BUN 18; Creatinine, Ser 1.18; Potassium 3.8; Sodium 136  Recent Lipid Panel    Component Value Date/Time   CHOL 130 04/11/2016 0921   TRIG 122 04/11/2016 0921    HDL 37 (L) 04/11/2016 0921   CHOLHDL 3.5 04/11/2016 0921   CHOLHDL 4 08/22/2010 1101   VLDL 24.8 08/22/2010 1101   LDLCALC 69 04/11/2016 0921    Physical Exam:    Vital Signs: BP 122/64   Pulse (!) 52   Ht 6' (1.829 m)   Wt 236 lb (107 kg)   BMI 32.01 kg/m     Wt Readings from Last 3 Encounters:  04/20/20 236 lb (107 kg)  03/17/20 240 lb (108.9 kg)  03/04/20 240 lb (108.9 kg)     General: 69 y.o. male in no acute distress. HEENT: Normocephalic and atraumatic. Sclera clear.  Neck: Supple. No carotid bruits. No JVD. Heart: Bradycardic with regular rhythm. Distinct S1 and S2. No murmurs, gallops, or rubs. Radial pulses 2+ and equal bilaterally. Lungs: No increased work of breathing. Clear to ausculation bilaterally. No wheezes, rhonchi, or rales.  Abdomen: Soft, non-distended, and non-tender to palpation.  Extremities: No lower extremity edema.    Skin: Warm and dry. Neuro: Alert and oriented x3. No focal deficits. Psych: Normal affect. Responds appropriately.  Assessment:    1. Pre-op evaluation   2. Coronary artery disease involving native coronary artery of native heart without angina pectoris   3. Cardiomyopathy, unspecified type ([place])   4. Mitral valve insufficiency, unspecified etiology   5. Primary hypertension   6. Hyperlipidemia, unspecified hyperlipidemia type   7. Type 2 diabetes mellitus with complication, without long-term current use of insulin ([place])   8. Obstructive sleep apnea     Plan:    Pre-Op Evaluation - Patient has upcoming colonoscopy planned.  - Stable from a cardiac standpoint. No angina, CHF symptoms, or syncope. - Per Revised Cardiac Risk Index, considered low to moderate risk given history of CAD and mildly reduce EF. Able to complete >4.0 METS without any anginal symptoms. Therefore, based on ACC/AHA guidelines, patient would be at acceptable risk for the planned procedure without further cardiovascular testing. I will route this  recommendation to the requesting party via Epic fax function.  CAD - History of remote stenting with BMS to RCA in 2008. Most recent LHC in 02/2017 showed mild non-obstructive CAD with widely patent stent in RCA. - No angina.  - Not on Aspirin. Recommended he restart this. - Continue beta-blocker and high-intensity statin.   Cardiomyopathy - Patient has history of mild LV dysfunction. - Echo in 2018 showed LVEF of 45-50% with hypokinesis of  the inferior myocardium. However, systolic function normal on LHC in 2019. - Patient euvolemic on exam.  - Continue Lasix 96m daily. Continue K-Dur 10 mEq daily with this.  - Continue Lisinopril 519mdaily and Toprol-XL 10020maily. - Will repeat Echo for routine monitoring of mitral regurgitation and can reassess EF at that time.  Of note, recent labs on 03/17/2020 (per [facility]) showed stable creatinine (1.18) and potassium (3.8).  Mitral Regurgitation - Last Echo in 2018 showed mild MR. - No significant murmur on exam.  - Will order Echo for routine screening. This does not need to be done before colonoscopy.   Hypertension - BP well controlled. - Continue Lisinopril and Toprol-XL as above.  Hyperlipidemia - Continue Lipitor 31m98mily. - Followed by PCP.  Type 2 Diabetes Mellitus - Managed by PCP.  Obstructive Sleep Apnea - Compliant with CPAP. - Followed by Dr. KellClaiborne BillingsDisposition: Will arrange follow-up in Dr. KellEvette Georgesep clinic as he has not been seen in a while. Otherwise, can follow-up for general cardiac care in 1 year.   Medication Adjustments/Labs and Tests Ordered: Current medicines are reviewed at length with the patient today.  Concerns regarding medicines are outlined above.  Orders Placed This Encounter  Procedures  . EKG 12-Lead  . ECHOCARDIOGRAM COMPLETE   Meds ordered this encounter  Medications  . nitroGLYCERIN (NITROSTAT) 0.4 MG SL tablet    Sig: Place 1 tablet (0.4 mg total) under the tongue every 5 (five)  minutes as needed.    Dispense:  25 tablet    Refill:  1    Patient Instructions  Medication Instructions:   RESTART Aspirin 81 mg daily  Your physician recommends that you continue on your current medications as directed. Please refer to the Current Medication list given to you today.  *If you need a refill on your cardiac medications before your next appointment, please call your pharmacy*   Lab Work: NONE ordered at this time of appointment   If you have labs (blood work) drawn today and your tests are completely normal, you will receive your results only by: . MyMarland Kitchenhart Message (if you have MyChart) OR . A paper copy in the mail If you have any lab test that is abnormal or we need to change your treatment, we will call you to review the results.   Testing/Procedures: Your physician has requested that you have an echocardiogram. Echocardiography is a painless test that uses sound waves to create images of your heart. It provides your doctor with information about the size and shape of your heart and how well your heart's chambers and valves are working. This procedure takes approximately one hour. There are no restrictions for this procedure. This test is performed [address] [id number] schedule for 2-3 months    Follow-Up: At [facility] and your health needs are our priority.  As part of our continuing mission to provide you with exceptional heart care, we have created designated Provider Care Teams.  These Care Teams include your primary Cardiologist (physician) and Advanced Practice Providers (APPs -  Physician Assistants and Nurse Practitioners) who all work together to provide you with the care you need, when you need it.  We recommend signing up for the patient portal called "MyChart".  Sign up information is provided on this After Visit Summary.  MyChart is used to connect with patients for Virtual Visits (Telemedicine).   Patients are able to view  lab/test results, encounter notes, upcoming appointments, etc.  Non-urgent messages can be sent to your provider as well.   To learn more about what you can do with MyChart, go to [email or web address].    Your next appointment:   First available with Sleep Clinic 1 year(s)    The format for your next appointment:   In Person In Person  Provider:   Shelva Majestic, MD  Other Instructions      Signed, Darreld Mclean, PA-C  04/20/2020 11:06 AM    [place]

## 2020-04-13 DIAGNOSIS — H60331 Swimmer's ear, right ear: Secondary | ICD-10-CM | POA: Diagnosis not present

## 2020-04-19 ENCOUNTER — Ambulatory Visit: Payer: Medicare Other | Admitting: General Practice

## 2020-04-20 ENCOUNTER — Other Ambulatory Visit: Payer: Self-pay

## 2020-04-20 ENCOUNTER — Ambulatory Visit: Payer: Medicare Other | Admitting: Student

## 2020-04-20 ENCOUNTER — Encounter: Payer: Self-pay | Admitting: Student

## 2020-04-20 VITALS — BP 122/64 | HR 52 | Ht 72.0 in | Wt 236.0 lb

## 2020-04-20 DIAGNOSIS — I10 Essential (primary) hypertension: Secondary | ICD-10-CM | POA: Diagnosis not present

## 2020-04-20 DIAGNOSIS — I2510 Atherosclerotic heart disease of native coronary artery without angina pectoris: Secondary | ICD-10-CM | POA: Diagnosis not present

## 2020-04-20 DIAGNOSIS — Z01818 Encounter for other preprocedural examination: Secondary | ICD-10-CM | POA: Diagnosis not present

## 2020-04-20 DIAGNOSIS — G4733 Obstructive sleep apnea (adult) (pediatric): Secondary | ICD-10-CM

## 2020-04-20 DIAGNOSIS — E118 Type 2 diabetes mellitus with unspecified complications: Secondary | ICD-10-CM

## 2020-04-20 DIAGNOSIS — I340 Nonrheumatic mitral (valve) insufficiency: Secondary | ICD-10-CM

## 2020-04-20 DIAGNOSIS — I429 Cardiomyopathy, unspecified: Secondary | ICD-10-CM

## 2020-04-20 DIAGNOSIS — E785 Hyperlipidemia, unspecified: Secondary | ICD-10-CM | POA: Diagnosis not present

## 2020-04-20 MED ORDER — NITROGLYCERIN 0.4 MG SL SUBL: 0.4000 mg | SUBLINGUAL_TABLET | SUBLINGUAL | 1 refills | Status: AC | PRN

## 2020-04-20 NOTE — Patient Instructions (Addendum)
Medication Instructions:   RESTART Aspirin 81 mg daily  Your physician recommends that you continue on your current medications as directed. Please refer to the Current Medication list given to you today.  *If you need a refill on your cardiac medications before your next appointment, please call your pharmacy*   Lab Work: NONE ordered at this time of appointment   If you have labs (blood work) drawn today and your tests are completely normal, you will receive your results only by: Marland Kitchen MyChart Message (if you have MyChart) OR . A paper copy in the mail If you have any lab test that is abnormal or we need to change your treatment, we will call you to review the results.   Testing/Procedures: Your physician has requested that you have an echocardiogram. Echocardiography is a painless test that uses sound waves to create images of your heart. It provides your doctor with information about the size and shape of your heart and how well your heart's chambers and valves are working. This procedure takes approximately one hour. There are no restrictions for this procedure. This test is performed at [address], [place], [place] [address]   Please schedule for 2-3 months    Follow-Up: At [facility], you and your health needs are our priority.  As part of our continuing mission to provide you with exceptional heart care, we have created designated Provider Care Teams.  These Care Teams include your primary Cardiologist (physician) and Advanced Practice Providers (APPs -  Physician Assistants and Nurse Practitioners) who all work together to provide you with the care you need, when you need it.  We recommend signing up for the patient portal called "MyChart".  Sign up information is provided on this After Visit Summary.  MyChart is used to connect with patients for Virtual Visits (Telemedicine).  Patients are able to view lab/test results, encounter notes, upcoming appointments, etc.   Non-urgent messages can be sent to your provider as well.   To learn more about what you can do with MyChart, go to [email or web address].    Your next appointment:   First available with Sleep Clinic 1 year(s)    The format for your next appointment:   In Person In Person  Provider:   Nicki Guadal[place], MD  Other Instructions

## 2020-05-21 ENCOUNTER — Other Ambulatory Visit (INDEPENDENT_AMBULATORY_CARE_PROVIDER_SITE_OTHER): Payer: Self-pay

## 2020-06-08 ENCOUNTER — Other Ambulatory Visit (HOSPITAL_COMMUNITY)
Admission: RE | Admit: 2020-06-08 | Discharge: 2020-06-08 | Disposition: A | Discharge status: Home / Self Care | Payer: Medicare Other | Source: Ambulatory Visit | Attending: Gastroenterology | Admitting: Gastroenterology

## 2020-06-08 ENCOUNTER — Other Ambulatory Visit: Payer: Self-pay

## 2020-06-08 DIAGNOSIS — I2510 Atherosclerotic heart disease of native coronary artery without angina pectoris: Secondary | ICD-10-CM | POA: Diagnosis not present

## 2020-06-08 DIAGNOSIS — Z1211 Encounter for screening for malignant neoplasm of colon: Secondary | ICD-10-CM | POA: Diagnosis not present

## 2020-06-08 DIAGNOSIS — Z7984 Long term (current) use of oral hypoglycemic drugs: Secondary | ICD-10-CM | POA: Diagnosis not present

## 2020-06-08 DIAGNOSIS — K644 Residual hemorrhoidal skin tags: Secondary | ICD-10-CM | POA: Diagnosis not present

## 2020-06-08 DIAGNOSIS — Z79899 Other long term (current) drug therapy: Secondary | ICD-10-CM | POA: Diagnosis not present

## 2020-06-08 DIAGNOSIS — Z955 Presence of coronary angioplasty implant and graft: Secondary | ICD-10-CM | POA: Diagnosis not present

## 2020-06-08 DIAGNOSIS — K5730 Diverticulosis of large intestine without perforation or abscess without bleeding: Secondary | ICD-10-CM | POA: Diagnosis not present

## 2020-06-08 DIAGNOSIS — Z87891 Personal history of nicotine dependence: Secondary | ICD-10-CM | POA: Diagnosis not present

## 2020-06-08 DIAGNOSIS — Z7982 Long term (current) use of aspirin: Secondary | ICD-10-CM | POA: Diagnosis not present

## 2020-06-08 DIAGNOSIS — Z8719 Personal history of other diseases of the digestive system: Secondary | ICD-10-CM | POA: Diagnosis not present

## 2020-06-08 DIAGNOSIS — I252 Old myocardial infarction: Secondary | ICD-10-CM | POA: Diagnosis not present

## 2020-06-08 DIAGNOSIS — E119 Type 2 diabetes mellitus without complications: Secondary | ICD-10-CM | POA: Diagnosis not present

## 2020-06-08 DIAGNOSIS — K648 Other hemorrhoids: Secondary | ICD-10-CM | POA: Diagnosis not present

## 2020-06-08 DIAGNOSIS — K623 Rectal prolapse: Secondary | ICD-10-CM | POA: Diagnosis not present

## 2020-06-08 DIAGNOSIS — Z8249 Family history of ischemic heart disease and other diseases of the circulatory system: Secondary | ICD-10-CM | POA: Diagnosis not present

## 2020-06-08 DIAGNOSIS — I10 Essential (primary) hypertension: Secondary | ICD-10-CM | POA: Diagnosis not present

## 2020-06-08 DIAGNOSIS — E785 Hyperlipidemia, unspecified: Secondary | ICD-10-CM | POA: Diagnosis not present

## 2020-06-08 LAB — BASIC METABOLIC PANEL
Anion gap: 6 (ref 5–15)
BUN: 15 mg/dL (ref 8–23)
CO2: 25 mmol/L (ref 22–32)
Calcium: 8.9 mg/dL (ref 8.9–10.3)
Chloride: 105 mmol/L (ref 98–111)
Creatinine, Ser: 1.28 mg/dL — ABNORMAL HIGH (ref 0.61–1.24)
GFR, Estimated: >60 mL/min (ref >60)
Glucose, Bld: 116 mg/dL — ABNORMAL HIGH (ref 70–99)
Potassium: 3.7 mmol/L (ref 3.5–5.1)
Sodium: 136 mmol/L (ref 135–145)

## 2020-06-09 ENCOUNTER — Other Ambulatory Visit: Payer: Self-pay

## 2020-06-09 ENCOUNTER — Ambulatory Visit (HOSPITAL_COMMUNITY): Payer: Medicare Other | Admitting: Certified Registered"

## 2020-06-09 ENCOUNTER — Encounter (HOSPITAL_COMMUNITY): Payer: Self-pay | Admitting: Gastroenterology

## 2020-06-09 ENCOUNTER — Encounter (HOSPITAL_COMMUNITY)
Admission: RE | Disposition: A | Discharge status: Home / Self Care | Payer: Self-pay | Source: Home / Self Care | Attending: Gastroenterology

## 2020-06-09 ENCOUNTER — Encounter (INDEPENDENT_AMBULATORY_CARE_PROVIDER_SITE_OTHER): Payer: Self-pay | Admitting: *Deleted

## 2020-06-09 ENCOUNTER — Ambulatory Visit (HOSPITAL_COMMUNITY)
Admission: RE | Admit: 2020-06-09 | Discharge: 2020-06-09 | Disposition: A | Discharge status: Home / Self Care | Payer: Medicare Other | Attending: Gastroenterology | Admitting: Gastroenterology

## 2020-06-09 DIAGNOSIS — I2510 Atherosclerotic heart disease of native coronary artery without angina pectoris: Secondary | ICD-10-CM | POA: Diagnosis not present

## 2020-06-09 DIAGNOSIS — K644 Residual hemorrhoidal skin tags: Secondary | ICD-10-CM | POA: Diagnosis not present

## 2020-06-09 DIAGNOSIS — Z7982 Long term (current) use of aspirin: Secondary | ICD-10-CM | POA: Diagnosis not present

## 2020-06-09 DIAGNOSIS — Z955 Presence of coronary angioplasty implant and graft: Secondary | ICD-10-CM | POA: Insufficient documentation

## 2020-06-09 DIAGNOSIS — I252 Old myocardial infarction: Secondary | ICD-10-CM | POA: Diagnosis not present

## 2020-06-09 DIAGNOSIS — Z8719 Personal history of other diseases of the digestive system: Secondary | ICD-10-CM | POA: Insufficient documentation

## 2020-06-09 DIAGNOSIS — I10 Essential (primary) hypertension: Secondary | ICD-10-CM | POA: Insufficient documentation

## 2020-06-09 DIAGNOSIS — K5730 Diverticulosis of large intestine without perforation or abscess without bleeding: Secondary | ICD-10-CM | POA: Insufficient documentation

## 2020-06-09 DIAGNOSIS — K648 Other hemorrhoids: Secondary | ICD-10-CM | POA: Diagnosis not present

## 2020-06-09 DIAGNOSIS — Z1211 Encounter for screening for malignant neoplasm of colon: Secondary | ICD-10-CM | POA: Insufficient documentation

## 2020-06-09 DIAGNOSIS — Z7984 Long term (current) use of oral hypoglycemic drugs: Secondary | ICD-10-CM | POA: Diagnosis not present

## 2020-06-09 DIAGNOSIS — Z87891 Personal history of nicotine dependence: Secondary | ICD-10-CM | POA: Diagnosis not present

## 2020-06-09 DIAGNOSIS — K623 Rectal prolapse: Secondary | ICD-10-CM | POA: Diagnosis not present

## 2020-06-09 DIAGNOSIS — E119 Type 2 diabetes mellitus without complications: Secondary | ICD-10-CM | POA: Diagnosis not present

## 2020-06-09 DIAGNOSIS — Z8249 Family history of ischemic heart disease and other diseases of the circulatory system: Secondary | ICD-10-CM | POA: Insufficient documentation

## 2020-06-09 DIAGNOSIS — E785 Hyperlipidemia, unspecified: Secondary | ICD-10-CM | POA: Insufficient documentation

## 2020-06-09 DIAGNOSIS — Z79899 Other long term (current) drug therapy: Secondary | ICD-10-CM | POA: Diagnosis not present

## 2020-06-09 DIAGNOSIS — K921 Melena: Secondary | ICD-10-CM | POA: Diagnosis not present

## 2020-06-09 HISTORY — PX: COLONOSCOPY WITH PROPOFOL: SHX5780

## 2020-06-09 LAB — HM COLONOSCOPY

## 2020-06-09 LAB — GLUCOSE, CAPILLARY: Glucose-Capillary: 116 mg/dL — ABNORMAL HIGH (ref 70–99)

## 2020-06-09 SURGERY — COLONOSCOPY WITH PROPOFOL
Anesthesia: General

## 2020-06-09 MED ORDER — LACTATED RINGERS IV SOLN: INTRAVENOUS | Status: DC | PRN

## 2020-06-09 MED ORDER — PROPOFOL 10 MG/ML IV BOLUS
INTRAVENOUS | Status: DC | PRN
  Administered 2020-06-09: 100 mg via INTRAVENOUS

## 2020-06-09 MED ORDER — PROPOFOL 500 MG/50ML IV EMUL
INTRAVENOUS | Status: DC | PRN
  Administered 2020-06-09: 150 ug/kg/min via INTRAVENOUS

## 2020-06-09 MED ORDER — LACTATED RINGERS IV SOLN: INTRAVENOUS | Status: DC

## 2020-06-09 MED ORDER — LIDOCAINE HCL (CARDIAC) PF 100 MG/5ML IV SOSY
PREFILLED_SYRINGE | INTRAVENOUS | Status: DC | PRN
  Administered 2020-06-09: 50 mg via INTRAVENOUS

## 2020-06-09 NOTE — Anesthesia Preprocedure Evaluation (Signed)
Anesthesia Evaluation  Patient identified by MRN, date of birth, ID band Patient awake    Reviewed: Allergy & Precautions, H&P , NPO status , Patient's Chart, lab work & pertinent test results, reviewed documented beta blocker date and time   Airway Mallampati: II  TM Distance: >3 FB Neck ROM: full    Dental no notable dental hx. (+) Teeth Intact   Pulmonary sleep apnea , former smoker,    Pulmonary exam normal breath sounds clear to auscultation       Cardiovascular Exercise Tolerance: Good hypertension, + CAD and + Cardiac Stents   Rhythm:regular Rate:Normal     Neuro/Psych negative neurological ROS  negative psych ROS   GI/Hepatic negative GI ROS, Neg liver ROS,   Endo/Other  negative endocrine ROSdiabetes  Renal/GU negative Renal ROS  negative genitourinary   Musculoskeletal   Abdominal   Peds  Hematology negative hematology ROS (+)   Anesthesia Other Findings Benign cardiac ROS  Reproductive/Obstetrics negative OB ROS                             Anesthesia Physical Anesthesia Plan  ASA: III  Anesthesia Plan: General   Post-op Pain Management:    Induction:   PONV Risk Score and Plan: Propofol infusion  Airway Management Planned:   Additional Equipment:   Intra-op Plan:   Post-operative Plan:   Informed Consent: I have reviewed the patients History and Physical, chart, labs and discussed the procedure including the risks, benefits and alternatives for the proposed anesthesia with the patient or authorized representative who has indicated his/her understanding and acceptance.     Dental Advisory Given  Plan Discussed with: CRNA  Anesthesia Plan Comments:         Anesthesia Quick Evaluation

## 2020-06-09 NOTE — Anesthesia Postprocedure Evaluation (Signed)
Anesthesia Post Note  Patient: [name] [name]  Procedure(s) Performed: COLONOSCOPY WITH PROPOFOL (N/A )  Patient location during evaluation: Phase II Anesthesia Type: General Level of consciousness: awake Pain management: pain level controlled Vital Signs Assessment: post-procedure vital signs reviewed and stable Respiratory status: spontaneous breathing and respiratory function stable Cardiovascular status: blood pressure returned to baseline and stable Postop Assessment: no headache and no apparent nausea or vomiting Anesthetic complications: no Comments: Late entry   No complications documented.   Last Vitals:  Vitals:   06/09/20 0844 06/09/20 1020  BP:  133/76  Pulse: 75   Resp: (!) 21 18  Temp: 36.6 C 36.4 C  SpO2: 97% 97%    Last Pain:  Vitals:   06/09/20 1020  TempSrc: Oral  PainSc: 0-No pain                 Windell [place]

## 2020-06-09 NOTE — Discharge Instructions (Signed)
You are being discharged to home.  Resume your previous diet.  Your physician has recommended a repeat colonoscopy in 10 years for screening purposes.  Monitored Anesthesia Care, Care After This sheet gives you information about how to care for yourself after your procedure. Your health care provider may also give you more specific instructions. If you have problems or questions, contact your health care provider. What can I expect after the procedure? After the procedure, it is common to have:  Tiredness.  Forgetfulness about what happened after the procedure.  Impaired judgment for important decisions.  Nausea or vomiting.  Some difficulty with balance. Follow these instructions at home: For the time period you were told by your health care provider:  Rest as needed.  Do not participate in activities where you could fall or become injured.  Do not drive or use machinery.  Do not drink alcohol.  Do not take sleeping pills or medicines that cause drowsiness.  Do not make important decisions or sign legal documents.  Do not take care of children on your own.      Eating and drinking  Follow the diet that is recommended by your health care provider.  Drink enough fluid to keep your urine pale yellow.  If you vomit: ? Drink water, juice, or soup when you can drink without vomiting. ? Make sure you have little or no nausea before eating solid foods. General instructions  Have a responsible adult stay with you for the time you are told. It is important to have someone help care for you until you are awake and alert.  Take over-the-counter and prescription medicines only as told by your health care provider.  If you have sleep apnea, surgery and certain medicines can increase your risk for breathing problems. Follow instructions from your health care provider about wearing your sleep device: ? Anytime you are sleeping, including during daytime naps. ? While taking  prescription pain medicines, sleeping medicines, or medicines that make you drowsy.  Avoid smoking.  Keep all follow-up visits as told by your health care provider. This is important. Contact a health care provider if:  You keep feeling nauseous or you keep vomiting.  You feel light-headed.  You are still sleepy or having trouble with balance after 24 hours.  You develop a rash.  You have a fever.  You have redness or swelling around the IV site. Get help right away if:  You have trouble breathing.  You have new-onset confusion at home. Summary  For several hours after your procedure, you may feel tired. You may also be forgetful and have poor judgment.  Have a responsible adult stay with you for the time you are told. It is important to have someone help care for you until you are awake and alert.  Rest as told. Do not drive or operate machinery. Do not drink alcohol or take sleeping pills.  Get help right away if you have trouble breathing, or if you suddenly become confused. This information is not intended to replace advice given to you by your health care provider. Make sure you discuss any questions you have with your health care provider. Document Revised: 09/11/2019 Document Reviewed: 11/28/2018 Elsevier Patient Education  2021 [employer]  Colonoscopy, Adult, Care After This sheet gives you information about how to care for yourself after your procedure. Your doctor may also give you more specific instructions. If you have problems or questions, call your doctor. What can I expect after the procedure?  After the procedure, it is common to have:  A small amount of blood in your poop (stool) for 24 hours.  Some gas.  Mild cramping or bloating in your belly (abdomen). Follow these instructions at home: Eating and drinking  Drink enough fluid to keep your pee (urine) pale yellow.  Follow instructions from your doctor about what you cannot eat or  drink.  Return to your normal diet as told by your doctor. Avoid heavy or fried foods that are hard to digest.   Activity  Rest as told by your doctor.  Do not sit for a long time without moving. Get up to take short walks every 1-2 hours. This is important. Ask for help if you feel weak or unsteady.  Return to your normal activities as told by your doctor. Ask your doctor what activities are safe for you. To help cramping and bloating:  Try walking around.  Put heat on your belly as told by your doctor. Use the heat source that your doctor recommends, such as a moist heat pack or a heating pad. ? Put a towel between your skin and the heat source. ? Leave the heat on for 20-30 minutes. ? Remove the heat if your skin turns bright red. This is very important if you are unable to feel pain, heat, or cold. You may have a greater risk of getting burned.   General instructions  If you were given a medicine to help you relax (sedative) during your procedure, it can affect you for many hours. Do not drive or use machinery until your doctor says that it is safe.  For the first 24 hours after the procedure: ? Do not sign important documents. ? Do not drink alcohol. ? Do your daily activities more slowly than normal. ? Eat foods that are soft and easy to digest.  Take over-the-counter or prescription medicines only as told by your doctor.  Keep all follow-up visits as told by your doctor. This is important. Contact a doctor if:  You have blood in your poop 2-3 days after the procedure. Get help right away if:  You have more than a small amount of blood in your poop.  You see large clumps of tissue (blood clots) in your poop.  Your belly is swollen.  You feel like you may vomit (nauseous).  You vomit.  You have a fever.  You have belly pain that gets worse, and medicine does not help your pain. Summary  After the procedure, it is common to have a small amount of blood in your  poop. You may also have mild cramping and bloating in your belly.  If you were given a medicine to help you relax (sedative) during your procedure, it can affect you for many hours. Do not drive or use machinery until your doctor says that it is safe.  Get help right away if you have a lot of blood in your poop, feel like you may vomit, have a fever, or have more belly pain. This information is not intended to replace advice given to you by your health care provider. Make sure you discuss any questions you have with your health care provider. Document Revised: 11/01/2018 Document Reviewed: 07/22/2018 Elsevier Patient Education  2021 [employer].

## 2020-06-09 NOTE — Transfer of Care (Signed)
Immediate Anesthesia Transfer of Care Note  Patient: [name] [name]  Procedure(s) Performed: COLONOSCOPY WITH PROPOFOL (N/A )  Patient Location: Endoscopy Unit  Anesthesia Type:General  Level of Consciousness: awake, alert  and oriented  Airway & Oxygen Therapy: Patient Spontanous Breathing  Post-op Assessment: Report given to RN and Post -op Vital signs reviewed and stable  Post vital signs: Reviewed and stable  Last Vitals:  Vitals Value Taken Time  BP 133/76 06/09/20 1020  Temp 36.4 C 06/09/20 1020  Pulse    Resp 18 06/09/20 1020  SpO2 97 % 06/09/20 1020    Last Pain:  Vitals:   06/09/20 1020  TempSrc: Oral  PainSc: 0-No pain         Complications: No complications documented.

## 2020-06-09 NOTE — H&P (Signed)
[name] [name] is an 70 y.o. male.   Chief Complaint: Colorectal cancer screening HPI: [name] [name] is a 70 y.o. male with PMH CAD s/p stent placement and prior MI, DM, HTN, HLD,  hemorrhoids, who presents for colorectal cancer screening.  Last colonoscopy was performed more than 10 years ago, no report is available.  The patient denies having any complaints such as melena, abdominal pain or distention, change in her bowel movement consistency or frequency, no changes in her weight recently.  States that he has presented some occasional episodes of blood when wiping which he thinks is related to the hemorrhoids he has had for multiple years no family history of colorectal cancer.   Past Medical History:  Diagnosis Date  . Coronary artery disease    S/P stent Summer 2008, January 2016 left main 30% stenosis, LAD mid 50% stenosis, patent stent in the mid right coronary artery with ectasia proximal and distal to the stent. EF mildly reduced at 40% with global hypokinesis.  . Diabetes mellitus (HCC)    A1c 6.1 (11-2008)  . Erectile dysfunction   . Hyperlipidemia   . Hypertension   . MI (mitral incompetence)   . Myocardial infarction ([place]) 2016  . Sleep apnea     Past Surgical History:  Procedure Laterality Date  . COLONOSCOPY  2012   negative  . LEFT HEART CATH AND CORONARY ANGIOGRAPHY N/A 02/14/2017   Procedure: LEFT HEART CATH AND CORONARY ANGIOGRAPHY;  Surgeon: Troy Sine, MD;  Location: [facility];  Service: Cardiovascular;  Laterality: N/A;  . WRIST SURGERY     fractured wrist, Dr. Amedeo Plenty    Family History  Problem Relation Age of Onset  . Pernicious anemia Maternal Grandmother   . Coronary artery disease Father 33  . Colon cancer Father        ?  . Stroke Mother 17  . Hypertension Brother   . Stroke Maternal Grandfather   . CAD Brother 64  . CAD Brother 44   Social History:  reports that he quit smoking about 17 years ago. He has never used smokeless tobacco.  He reports current alcohol use. He reports current drug use. Drug: Marijuana.  Allergies: No Known Allergies  Medications Prior to Admission  Medication Sig Dispense Refill  . aspirin EC 81 MG tablet Take 81 mg by mouth in the morning. Swallow whole.    Marland Kitchen atorvastatin (LIPITOR) 40 MG tablet TAKE 1 TABLET BY MOUTH ONCE DAILY (Patient taking differently: Take 40 mg by mouth every evening.) 90 tablet 2  . furosemide (LASIX) 20 MG tablet Take 20 mg by mouth in the morning.  3  . lisinopril (PRINIVIL,ZESTRIL) 5 MG tablet TAKE 1 TABLET BY MOUTH ONCE DAILY (Patient taking differently: Take 5 mg by mouth in the morning.) 90 tablet 3  . metFORMIN (GLUCOPHAGE) 500 MG tablet TAKE 1 TABLET BY MOUTH ONCE DAILY (Patient taking differently: Take 500 mg by mouth every evening.) 90 tablet 3  . metoprolol succinate (TOPROL-XL) 100 MG 24 hr tablet TAKE 1 TABLET BY MOUTH ONCE DAILY WITH  OR  IMMEDIATELY  FOLLOWING  A  MEAL (Patient taking differently: Take 100 mg by mouth in the morning.) 90 tablet 3  . Na Sulfate-K Sulfate-Mg Sulf (SUPREP BOWEL PREP KIT) 17.5-3.13-1.6 GM/177ML SOLN Take 1 kit by mouth as directed. 354 mL 0  . potassium chloride (K-DUR) 10 MEQ tablet Take 10 mEq by mouth in the morning.  4  . nitroGLYCERIN (NITROSTAT) 0.4 MG  SL tablet Place 1 tablet (0.4 mg total) under the tongue every 5 (five) minutes as needed. 25 tablet 1    Results for orders placed or performed during the hospital encounter of 06/09/20 (from the past 48 hour(s))  Basic metabolic panel     Status: Abnormal   Collection Time: 06/08/20  7:44 AM  Result Value Ref Range   Sodium 136 135 - 145 mmol/L   Potassium 3.7 3.5 - 5.1 mmol/L   Chloride 105 98 - 111 mmol/L   CO2 25 22 - 32 mmol/L   Glucose, Bld 116 (H) 70 - 99 mg/dL    Comment: Glucose reference range applies only to samples taken after fasting for at least 8 hours.   BUN 15 8 - 23 mg/dL   Creatinine, Ser 1.28 (H) 0.61 - 1.24 mg/dL   Calcium 8.9 8.9 - 10.3 mg/dL    GFR, Estimated >60 >60 mL/min    Comment: (NOTE) Calculated using the CKD-EPI Creatinine Equation (2021)    Anion gap 6 5 - 15    Comment: Performed at [facility], [address] [place],  [address]  Glucose, capillary     Status: Abnormal   Collection Time: 06/09/20  8:47 AM  Result Value Ref Range   Glucose-Capillary 116 (H) 70 - 99 mg/dL    Comment: Glucose reference range applies only to samples taken after fasting for at least 8 hours.   No results found.  Review of Systems  Constitutional: Negative.   HENT: Negative.   Eyes: Negative.   Respiratory: Negative.   Cardiovascular: Negative.   Gastrointestinal: Negative.   Endocrine: Negative.   Genitourinary: Negative.   Musculoskeletal: Negative.   Skin: Negative.   Allergic/Immunologic: Negative.   Neurological: Negative.   Hematological: Negative.   Psychiatric/Behavioral: Negative.     Pulse 75, temperature 97.9 F (36.6 C), temperature source Oral, resp. rate (!) 21, height 6' (1.829 m), SpO2 97 %. Physical Exam  GENERAL: The patient is AO x3, in no acute distress. HEENT: Head is normocephalic and atraumatic. EOMI are intact. Mouth is well hydrated and without lesions. NECK: Supple. No masses LUNGS: Clear to auscultation. No presence of rhonchi/wheezing/rales. Adequate chest expansion HEART: RRR, normal s1 and s2. ABDOMEN: Soft, nontender, no guarding, no peritoneal signs, and nondistended. BS +. No masses. EXTREMITIES: Without any cyanosis, clubbing, rash, lesions or edema. NEUROLOGIC: AOx3, no focal motor deficit. SKIN: no jaundice, no rashes  Assessment/Plan [name] [name] is a 70 y.o. male with PMH CAD s/p stent placement and prior MI, DM, HTN, HLD,  hemorrhoids, who presents for colorectal cancer screening.  We will proceed with colonoscopy.  Harvel Quale, MD 06/09/2020, 9:43 AM

## 2020-06-09 NOTE — Op Note (Signed)
[facility] Patient Name: [name] [name] Procedure Date: 06/09/2020 9:40 AM MRN: [id number] Date of Birth: [birth year] Attending MD: Katrinka Blazing ,  CSN: [id number] Age: 70 Admit Type: Outpatient Procedure:                Colonoscopy Indications:              Screening for colorectal malignant neoplasm Providers:                Katrinka Blazing, Criselda Peaches. Patsy Lager, RN, Dyann Ruddle Referring MD:              Medicines:                Monitored Anesthesia Care Complications:            No immediate complications. Estimated Blood Loss:     Estimated blood loss: none. Procedure:                Pre-Anesthesia Assessment:                           - Prior to the procedure, a History and Physical                            was performed, and patient medications, allergies                            and sensitivities were reviewed. The patient's                            tolerance of previous anesthesia was reviewed.                           - The risks and benefits of the procedure and the                            sedation options and risks were discussed with the                            patient. All questions were answered and informed                            consent was obtained.                           - ASA Grade Assessment: II - A patient with mild                            systemic disease.                           After obtaining informed consent, the colonoscope                            was passed under direct vision. Throughout the  procedure, the patient's blood pressure, pulse, and                            oxygen saturations were monitored continuously. The                            [id number]) was introduced through the anus                            and advanced to the the cecum, identified by                            appendiceal orifice and ileocecal valve. The                             colonoscopy was performed without difficulty. The                            patient tolerated the procedure well. The quality                            of the bowel preparation was adequate. Scope In: 9:54:02 AM Scope Out: 10:17:09 AM Scope Withdrawal Time: 0 hours 17 minutes 7 seconds  Total Procedure Duration: 0 hours 23 minutes 7 seconds  Findings:      The digital rectal exam findings include external hemorrhoids and rectal       prolapse.      Multiple small and large-mouthed diverticula were found in the sigmoid       colon.      Internal hemorrhoids were found during retroflexion. The hemorrhoids       were small. Impression:               - External hemorrhoids and rectal prolapse found on                            digital rectal exam.                           - Diverticulosis in the sigmoid colon.                           - Internal hemorrhoids.                           - No specimens collected. Moderate Sedation:      Per Anesthesia Care Recommendation:           - Discharge patient to home (ambulatory).                           - Resume previous diet.                           - Repeat colonoscopy in 10 years for screening                            purposes.                           -  Referral to general surgery for evaluation of                            hemorrhoids and rectal prolapse. Procedure Code(s):        --- Professional ---                           U9323, Colorectal cancer screening; colonoscopy on                            individual not meeting criteria for high risk Diagnosis Code(s):        --- Professional ---                           Z12.11, Encounter for screening for malignant                            neoplasm of colon                           K64.8, Other hemorrhoids                           K57.30, Diverticulosis of large intestine without                            perforation or abscess without bleeding CPT copyright 2019 American  Medical Association. All rights reserved. The codes documented in this report are preliminary and upon coder review may  be revised to meet current compliance requirements. Katrinka Blazing, MD Katrinka Blazing,  06/09/2020 10:22:55 AM This report has been signed electronically. Number of Addenda: 0

## 2020-06-09 NOTE — Anesthesia Procedure Notes (Signed)
Date/Time: 06/09/2020 9:55 AM Performed by: Julian Reil, CRNA Pre-anesthesia Checklist: Patient identified, Emergency Drugs available, Suction available and Patient being monitored Patient Re-evaluated:Patient Re-evaluated prior to induction Oxygen Delivery Method: Nasal cannula Induction Type: IV induction Placement Confirmation: positive ETCO2

## 2020-06-14 ENCOUNTER — Other Ambulatory Visit: Payer: Self-pay

## 2020-06-14 ENCOUNTER — Ambulatory Visit (HOSPITAL_COMMUNITY): Payer: Medicare Other | Attending: Internal Medicine

## 2020-06-14 DIAGNOSIS — I340 Nonrheumatic mitral (valve) insufficiency: Secondary | ICD-10-CM | POA: Diagnosis not present

## 2020-06-14 LAB — ECHOCARDIOGRAM COMPLETE
Area-P 1/2: 2.55 cm2
S' Lateral: 2.9 cm

## 2020-06-18 ENCOUNTER — Encounter (HOSPITAL_COMMUNITY): Payer: Self-pay | Admitting: Gastroenterology

## 2020-07-13 DIAGNOSIS — G4733 Obstructive sleep apnea (adult) (pediatric): Secondary | ICD-10-CM | POA: Diagnosis not present

## 2020-07-26 ENCOUNTER — Ambulatory Visit: Payer: Self-pay | Admitting: Surgery

## 2020-07-26 DIAGNOSIS — Z955 Presence of coronary angioplasty implant and graft: Secondary | ICD-10-CM | POA: Diagnosis not present

## 2020-07-26 DIAGNOSIS — E119 Type 2 diabetes mellitus without complications: Secondary | ICD-10-CM | POA: Diagnosis not present

## 2020-07-26 DIAGNOSIS — M6208 Separation of muscle (nontraumatic), other site: Secondary | ICD-10-CM | POA: Diagnosis not present

## 2020-07-26 DIAGNOSIS — E739 Lactose intolerance, unspecified: Secondary | ICD-10-CM | POA: Diagnosis not present

## 2020-07-26 DIAGNOSIS — K643 Fourth degree hemorrhoids: Secondary | ICD-10-CM | POA: Diagnosis not present

## 2020-07-26 DIAGNOSIS — K642 Third degree hemorrhoids: Secondary | ICD-10-CM | POA: Diagnosis not present

## 2020-08-12 DIAGNOSIS — I10 Essential (primary) hypertension: Secondary | ICD-10-CM | POA: Diagnosis not present

## 2020-08-12 DIAGNOSIS — E119 Type 2 diabetes mellitus without complications: Secondary | ICD-10-CM | POA: Diagnosis not present

## 2020-08-19 DIAGNOSIS — M62830 Muscle spasm of back: Secondary | ICD-10-CM | POA: Diagnosis not present

## 2020-08-19 DIAGNOSIS — E782 Mixed hyperlipidemia: Secondary | ICD-10-CM | POA: Diagnosis not present

## 2020-08-19 DIAGNOSIS — E1122 Type 2 diabetes mellitus with diabetic chronic kidney disease: Secondary | ICD-10-CM | POA: Diagnosis not present

## 2020-08-19 DIAGNOSIS — R0600 Dyspnea, unspecified: Secondary | ICD-10-CM | POA: Diagnosis not present

## 2020-08-19 DIAGNOSIS — I10 Essential (primary) hypertension: Secondary | ICD-10-CM | POA: Diagnosis not present

## 2020-08-19 DIAGNOSIS — N1831 Chronic kidney disease, stage 3a: Secondary | ICD-10-CM | POA: Diagnosis not present

## 2020-08-19 DIAGNOSIS — I5022 Chronic systolic (congestive) heart failure: Secondary | ICD-10-CM | POA: Diagnosis not present

## 2020-08-19 DIAGNOSIS — G4730 Sleep apnea, unspecified: Secondary | ICD-10-CM | POA: Diagnosis not present

## 2020-08-19 DIAGNOSIS — K649 Unspecified hemorrhoids: Secondary | ICD-10-CM | POA: Diagnosis not present

## 2020-08-19 DIAGNOSIS — Z0001 Encounter for general adult medical examination with abnormal findings: Secondary | ICD-10-CM | POA: Diagnosis not present

## 2020-10-27 ENCOUNTER — Encounter (HOSPITAL_COMMUNITY): Payer: Self-pay | Admitting: Emergency Medicine

## 2020-10-27 ENCOUNTER — Inpatient Hospital Stay (HOSPITAL_COMMUNITY): Payer: Medicare Other

## 2020-10-27 ENCOUNTER — Telehealth: Payer: Self-pay | Admitting: Surgery

## 2020-10-27 ENCOUNTER — Inpatient Hospital Stay (HOSPITAL_COMMUNITY)
Admission: AD | Admit: 2020-10-27 | Discharge: 2020-10-31 | DRG: 208 | Disposition: A | Discharge status: Home / Self Care | Payer: Medicare Other | Source: Ambulatory Visit | Attending: Internal Medicine | Admitting: Internal Medicine

## 2020-10-27 DIAGNOSIS — I272 Pulmonary hypertension, unspecified: Secondary | ICD-10-CM | POA: Diagnosis present

## 2020-10-27 DIAGNOSIS — J9601 Acute respiratory failure with hypoxia: Principal | ICD-10-CM | POA: Diagnosis present

## 2020-10-27 DIAGNOSIS — Z8249 Family history of ischemic heart disease and other diseases of the circulatory system: Secondary | ICD-10-CM | POA: Diagnosis not present

## 2020-10-27 DIAGNOSIS — I252 Old myocardial infarction: Secondary | ICD-10-CM | POA: Diagnosis not present

## 2020-10-27 DIAGNOSIS — I2510 Atherosclerotic heart disease of native coronary artery without angina pectoris: Secondary | ICD-10-CM | POA: Diagnosis not present

## 2020-10-27 DIAGNOSIS — K644 Residual hemorrhoidal skin tags: Secondary | ICD-10-CM | POA: Diagnosis not present

## 2020-10-27 DIAGNOSIS — N179 Acute kidney failure, unspecified: Secondary | ICD-10-CM | POA: Diagnosis not present

## 2020-10-27 DIAGNOSIS — Z8719 Personal history of other diseases of the digestive system: Secondary | ICD-10-CM

## 2020-10-27 DIAGNOSIS — R079 Chest pain, unspecified: Secondary | ICD-10-CM | POA: Diagnosis not present

## 2020-10-27 DIAGNOSIS — R404 Transient alteration of awareness: Secondary | ICD-10-CM | POA: Diagnosis not present

## 2020-10-27 DIAGNOSIS — I214 Non-ST elevation (NSTEMI) myocardial infarction: Secondary | ICD-10-CM | POA: Diagnosis not present

## 2020-10-27 DIAGNOSIS — J810 Acute pulmonary edema: Secondary | ICD-10-CM | POA: Diagnosis not present

## 2020-10-27 DIAGNOSIS — Z7984 Long term (current) use of oral hypoglycemic drugs: Secondary | ICD-10-CM | POA: Diagnosis not present

## 2020-10-27 DIAGNOSIS — Z800 Family history of malignant neoplasm of digestive organs: Secondary | ICD-10-CM

## 2020-10-27 DIAGNOSIS — J90 Pleural effusion, not elsewhere classified: Secondary | ICD-10-CM | POA: Diagnosis not present

## 2020-10-27 DIAGNOSIS — Z955 Presence of coronary angioplasty implant and graft: Secondary | ICD-10-CM | POA: Diagnosis not present

## 2020-10-27 DIAGNOSIS — J811 Chronic pulmonary edema: Secondary | ICD-10-CM | POA: Diagnosis not present

## 2020-10-27 DIAGNOSIS — E785 Hyperlipidemia, unspecified: Secondary | ICD-10-CM | POA: Diagnosis present

## 2020-10-27 DIAGNOSIS — Z20822 Contact with and (suspected) exposure to covid-19: Secondary | ICD-10-CM | POA: Diagnosis present

## 2020-10-27 DIAGNOSIS — Z87891 Personal history of nicotine dependence: Secondary | ICD-10-CM

## 2020-10-27 DIAGNOSIS — I25119 Atherosclerotic heart disease of native coronary artery with unspecified angina pectoris: Secondary | ICD-10-CM | POA: Diagnosis not present

## 2020-10-27 DIAGNOSIS — R918 Other nonspecific abnormal finding of lung field: Secondary | ICD-10-CM | POA: Diagnosis not present

## 2020-10-27 DIAGNOSIS — Z978 Presence of other specified devices: Secondary | ICD-10-CM

## 2020-10-27 DIAGNOSIS — Z7982 Long term (current) use of aspirin: Secondary | ICD-10-CM | POA: Diagnosis not present

## 2020-10-27 DIAGNOSIS — Z79899 Other long term (current) drug therapy: Secondary | ICD-10-CM | POA: Diagnosis not present

## 2020-10-27 DIAGNOSIS — I499 Cardiac arrhythmia, unspecified: Secondary | ICD-10-CM | POA: Diagnosis not present

## 2020-10-27 DIAGNOSIS — N183 Chronic kidney disease, stage 3 unspecified: Secondary | ICD-10-CM | POA: Diagnosis not present

## 2020-10-27 DIAGNOSIS — E1122 Type 2 diabetes mellitus with diabetic chronic kidney disease: Secondary | ICD-10-CM | POA: Diagnosis present

## 2020-10-27 DIAGNOSIS — Z743 Need for continuous supervision: Secondary | ICD-10-CM | POA: Diagnosis not present

## 2020-10-27 DIAGNOSIS — K643 Fourth degree hemorrhoids: Secondary | ICD-10-CM | POA: Diagnosis not present

## 2020-10-27 DIAGNOSIS — I129 Hypertensive chronic kidney disease with stage 1 through stage 4 chronic kidney disease, or unspecified chronic kidney disease: Secondary | ICD-10-CM | POA: Diagnosis not present

## 2020-10-27 DIAGNOSIS — R0600 Dyspnea, unspecified: Secondary | ICD-10-CM

## 2020-10-27 DIAGNOSIS — I5181 Takotsubo syndrome: Secondary | ICD-10-CM | POA: Diagnosis present

## 2020-10-27 DIAGNOSIS — E66811 Obesity, class 1: Secondary | ICD-10-CM | POA: Diagnosis present

## 2020-10-27 DIAGNOSIS — J9690 Respiratory failure, unspecified, unspecified whether with hypoxia or hypercapnia: Secondary | ICD-10-CM | POA: Diagnosis not present

## 2020-10-27 DIAGNOSIS — E669 Obesity, unspecified: Secondary | ICD-10-CM | POA: Diagnosis present

## 2020-10-27 DIAGNOSIS — K649 Unspecified hemorrhoids: Secondary | ICD-10-CM | POA: Diagnosis not present

## 2020-10-27 DIAGNOSIS — G4733 Obstructive sleep apnea (adult) (pediatric): Secondary | ICD-10-CM | POA: Diagnosis not present

## 2020-10-27 DIAGNOSIS — R6889 Other general symptoms and signs: Secondary | ICD-10-CM | POA: Diagnosis not present

## 2020-10-27 DIAGNOSIS — Z4682 Encounter for fitting and adjustment of non-vascular catheter: Secondary | ICD-10-CM | POA: Diagnosis not present

## 2020-10-27 DIAGNOSIS — I160 Hypertensive urgency: Secondary | ICD-10-CM | POA: Diagnosis not present

## 2020-10-27 DIAGNOSIS — Z9889 Other specified postprocedural states: Secondary | ICD-10-CM

## 2020-10-27 LAB — BLOOD GAS, ARTERIAL
Acid-base deficit: 5.2 mmol/L — ABNORMAL HIGH (ref 0.0–2.0)
Bicarbonate: 20.2 mmol/L (ref 20.0–28.0)
Drawn by: 360031
FIO2: 80
MECHVT: 620 mL
O2 Saturation: 97.2 %
PEEP: 8 cmH2O
Patient temperature: 98.6
RATE: 16 resp/min
pCO2 arterial: 40.9 mmHg (ref 32.0–48.0)
pH, Arterial: 7.315 — ABNORMAL LOW (ref 7.350–7.450)
pO2, Arterial: 97.1 mmHg (ref 83.0–108.0)

## 2020-10-27 LAB — RESP PANEL BY RT-PCR (FLU A&B, COVID) ARPGX2
Influenza A by PCR: NEGATIVE
Influenza B by PCR: NEGATIVE
SARS Coronavirus 2 by RT PCR: NEGATIVE

## 2020-10-27 LAB — CBC WITH DIFFERENTIAL/PLATELET
Abs Immature Granulocytes: 0.15 10*3/uL — ABNORMAL HIGH (ref 0.00–0.07)
Basophils Absolute: 0.1 10*3/uL (ref 0.0–0.1)
Basophils Relative: 0 %
Eosinophils Absolute: 0.1 10*3/uL (ref 0.0–0.5)
Eosinophils Relative: 0 %
HCT: 54.7 % — ABNORMAL HIGH (ref 39.0–52.0)
Hemoglobin: 17.1 g/dL — ABNORMAL HIGH (ref 13.0–17.0)
Immature Granulocytes: 1 %
Lymphocytes Relative: 5 %
Lymphs Abs: 1 10*3/uL (ref 0.7–4.0)
MCH: 29.5 pg (ref 26.0–34.0)
MCHC: 31.3 g/dL (ref 30.0–36.0)
MCV: 94.3 fL (ref 80.0–100.0)
Monocytes Absolute: 0.4 10*3/uL (ref 0.1–1.0)
Monocytes Relative: 2 %
Neutro Abs: 17.7 10*3/uL — ABNORMAL HIGH (ref 1.7–7.7)
Neutrophils Relative %: 92 %
Platelets: 243 10*3/uL (ref 150–400)
RBC: 5.8 MIL/uL (ref 4.22–5.81)
RDW: 15.8 % — ABNORMAL HIGH (ref 11.5–15.5)
WBC: 19.4 10*3/uL — ABNORMAL HIGH (ref 4.0–10.5)
nRBC: 0 % (ref 0.0–0.2)

## 2020-10-27 LAB — RENAL FUNCTION PANEL
Albumin: 3.8 g/dL (ref 3.5–5.0)
Anion gap: 10 (ref 5–15)
BUN: 22 mg/dL (ref 8–23)
CO2: 22 mmol/L (ref 22–32)
Calcium: 8.5 mg/dL — ABNORMAL LOW (ref 8.9–10.3)
Chloride: 105 mmol/L (ref 98–111)
Creatinine, Ser: 1.39 mg/dL — ABNORMAL HIGH (ref 0.61–1.24)
GFR, Estimated: 55 mL/min — ABNORMAL LOW (ref >60)
Glucose, Bld: 232 mg/dL — ABNORMAL HIGH (ref 70–99)
Phosphorus: 6 mg/dL — ABNORMAL HIGH (ref 2.5–4.6)
Potassium: 5.4 mmol/L — ABNORMAL HIGH (ref 3.5–5.1)
Sodium: 137 mmol/L (ref 135–145)

## 2020-10-27 LAB — HEMOGLOBIN A1C
Hgb A1c MFr Bld: 6.1 % — ABNORMAL HIGH (ref 4.8–5.6)
Mean Plasma Glucose: 128.37 mg/dL

## 2020-10-27 LAB — GLUCOSE, CAPILLARY
Glucose-Capillary: 130 mg/dL — ABNORMAL HIGH (ref 70–99)
Glucose-Capillary: 131 mg/dL — ABNORMAL HIGH (ref 70–99)
Glucose-Capillary: 126 mg/dL — ABNORMAL HIGH (ref 70–99)

## 2020-10-27 LAB — MAGNESIUM: Magnesium: 2.4 mg/dL (ref 1.7–2.4)

## 2020-10-27 LAB — MRSA NEXT GEN BY PCR, NASAL: MRSA by PCR Next Gen: NOT DETECTED

## 2020-10-27 LAB — BRAIN NATRIURETIC PEPTIDE: B Natriuretic Peptide: 56.3 pg/mL (ref 0.0–100.0)

## 2020-10-27 LAB — TROPONIN I (HIGH SENSITIVITY)
Troponin I (High Sensitivity): 250 ng/L (ref <18)
Troponin I (High Sensitivity): 593 ng/L (ref <18)

## 2020-10-27 LAB — POTASSIUM: Potassium: 4.4 mmol/L (ref 3.5–5.1)

## 2020-10-27 MED ORDER — SODIUM CHLORIDE 0.9 % IV SOLN: 2.0000 g | INTRAVENOUS | Status: DC

## 2020-10-27 MED ORDER — PANTOPRAZOLE SODIUM 40 MG IV SOLR
40.0000 mg | Freq: Every day | INTRAVENOUS | Status: DC
  Administered 2020-10-27 – 2020-10-31 (×5): 40 mg via INTRAVENOUS
  Filled 2020-10-27 (×5): qty 40

## 2020-10-27 MED ORDER — ACETAMINOPHEN 500 MG PO TABS: 1000.0000 mg | ORAL_TABLET | ORAL | Status: DC

## 2020-10-27 MED ORDER — HEPARIN SODIUM (PORCINE) 5000 UNIT/ML IJ SOLN
5000.0000 [IU] | Freq: Three times a day (TID) | INTRAMUSCULAR | Status: DC
  Administered 2020-10-27 – 2020-10-29 (×6): 5000 [IU] via SUBCUTANEOUS
  Filled 2020-10-27 (×6): qty 1

## 2020-10-27 MED ORDER — BUPIVACAINE LIPOSOME 1.3 % IJ SUSP: 20.0000 mL | INTRAMUSCULAR | Status: DC

## 2020-10-27 MED ORDER — ALBUTEROL SULFATE (2.5 MG/3ML) 0.083% IN NEBU
2.5000 mg | INHALATION_SOLUTION | RESPIRATORY_TRACT | Status: DC | PRN
  Administered 2020-10-28: 2.5 mg via RESPIRATORY_TRACT
  Filled 2020-10-27: qty 3

## 2020-10-27 MED ORDER — DEXMEDETOMIDINE HCL IN NACL 200 MCG/50ML IV SOLN
0.0000 ug/kg/h | INTRAVENOUS | Status: DC
  Administered 2020-10-27 (×2): 0.4 ug/kg/h via INTRAVENOUS
  Administered 2020-10-27: 0.7 ug/kg/h via INTRAVENOUS
  Administered 2020-10-28: 0.8 ug/kg/h via INTRAVENOUS
  Administered 2020-10-28: 0.7 ug/kg/h via INTRAVENOUS
  Filled 2020-10-27 (×6): qty 50

## 2020-10-27 MED ORDER — METRONIDAZOLE 500 MG/100ML IV SOLN: 500.0000 mg | INTRAVENOUS | Status: DC

## 2020-10-27 MED ORDER — FENTANYL CITRATE (PF) 100 MCG/2ML IJ SOLN: 25.0000 ug | INTRAMUSCULAR | Status: DC | PRN

## 2020-10-27 MED ORDER — CHLORHEXIDINE GLUCONATE CLOTH 2 % EX PADS: 6.0000 | MEDICATED_PAD | Freq: Once | CUTANEOUS | Status: DC

## 2020-10-27 MED ORDER — ORAL CARE MOUTH RINSE: 15.0000 mL | OROMUCOSAL | Status: DC

## 2020-10-27 MED ORDER — SODIUM CHLORIDE 0.9 % IV SOLN
INTRAVENOUS | Status: DC | PRN
  Administered 2020-10-27: 250 mL via INTRAVENOUS

## 2020-10-27 MED ORDER — CHLORHEXIDINE GLUCONATE 0.12% ORAL RINSE (MEDLINE KIT)
15.0000 mL | Freq: Two times a day (BID) | OROMUCOSAL | Status: DC
  Administered 2020-10-27 – 2020-10-28 (×3): 15 mL via OROMUCOSAL

## 2020-10-27 MED ORDER — CHLORHEXIDINE GLUCONATE CLOTH 2 % EX PADS
6.0000 | MEDICATED_PAD | Freq: Once | CUTANEOUS | Status: DC
Start: 2020-10-27 — End: 2020-10-27

## 2020-10-27 MED ORDER — FUROSEMIDE 10 MG/ML IJ SOLN
20.0000 mg | Freq: Once | INTRAMUSCULAR | Status: AC
  Administered 2020-10-27: 20 mg via INTRAVENOUS
  Filled 2020-10-27: qty 2

## 2020-10-27 MED ORDER — FENTANYL CITRATE (PF) 100 MCG/2ML IJ SOLN
25.0000 ug | INTRAMUSCULAR | Status: DC | PRN
  Administered 2020-10-27 (×2): 100 ug via INTRAVENOUS
  Filled 2020-10-27 (×2): qty 2

## 2020-10-27 MED ORDER — DOCUSATE SODIUM 50 MG/5ML PO LIQD: 100.0000 mg | Freq: Two times a day (BID) | ORAL | Status: DC

## 2020-10-27 MED ORDER — ENSURE PRE-SURGERY PO LIQD
296.0000 mL | Freq: Once | ORAL | Status: DC
  Filled 2020-10-27: qty 296

## 2020-10-27 MED ORDER — GABAPENTIN 300 MG PO CAPS: 300.0000 mg | ORAL_CAPSULE | ORAL | Status: DC

## 2020-10-27 MED ORDER — POLYETHYLENE GLYCOL 3350 17 G PO PACK: 17.0000 g | PACK | Freq: Every day | ORAL | Status: DC

## 2020-10-27 MED ORDER — CHLORHEXIDINE GLUCONATE CLOTH 2 % EX PADS
6.0000 | MEDICATED_PAD | Freq: Every day | CUTANEOUS | Status: DC
  Administered 2020-10-27 – 2020-10-28 (×2): 6 via TOPICAL

## 2020-10-27 MED ORDER — CHLORHEXIDINE GLUCONATE 0.12% ORAL RINSE (MEDLINE KIT): 15.0000 mL | Freq: Two times a day (BID) | OROMUCOSAL | Status: DC

## 2020-10-27 MED ORDER — ORAL CARE MOUTH RINSE
15.0000 mL | OROMUCOSAL | Status: DC
  Administered 2020-10-27 – 2020-10-28 (×8): 15 mL via OROMUCOSAL

## 2020-10-27 MED ORDER — HYDROCORT-PRAMOXINE (PERIANAL) 2.5-1 % EX CREA
1.0000 "application " | TOPICAL_CREAM | Freq: Four times a day (QID) | CUTANEOUS | Status: DC | PRN
  Filled 2020-10-27: qty 30

## 2020-10-27 NOTE — Consult Note (Deleted)
NAME:  [name] [name], MRN:  [id number], DOB:  10/10/1950, LOS: 0 ADMISSION DATE:  10/27/2020, CONSULTATION DATE:  10/27/2020 REFERRING MD:  Dr. Michaell Cowing, CHIEF COMPLAINT:  Respiratory failure   History of Present Illness:  70 year old male with prior history OSA compliant with CPAP, OSA on CPAP, HTN, CAD with MI 2016 with BMS, DMT2, and HLD presenting from Surgical center with respiratory failure.   Patient underwent hemorrhoidectomy by Dr. Michaell Cowing at the [facility] which was uncomplicated; he was prone for around 1.5 hrs.  He was extubated in PACU but developed respiratory distress, became hypoxic with pink frothy secretions suspicious for negative pressure pulmonary edema.  He was reintubated by anesthesia with CXR showing pulmonary edema.  Therefore he was transferred to [facility] ICU by [facility] for further care.  PCCM accepting.   Wife reports patient was in his normal state of health prior to surgery.  No reports of fever, URI, chest pain, leg swelling, or shortness of breath.   Pertinent  Medical History  OSA on CPAP, HTN, CAD with MI 2016 with BMS, DMT2, HLD, ED  Significant Hospital Events: Including procedures, antibiotic start and stop dates in addition to other pertinent events   10/19 uncomplicated hemorrhoidectomy at Surgical center > developed pulmonary edema post extubation, requiring reintubation, tx to [facility] ICU  Interim History / Subjective:  Arrived intubated, sats in the 80's requiring increased PEEP 8 and FiO2 1.0 to get sats up  Objective   Pulse 68, resp. rate (!) 27, height 6' (1.829 m), weight 84.8 kg, SpO2 92 %.    Vent Mode: PRVC FiO2 (%):  [100 %] 100 % Set Rate:  [16 bmp] 16 bmp Vt Set:  [620 mL] 620 mL PEEP:  [8 cmH20] 8 cmH20  No intake or output data in the 24 hours ending 10/27/20 1303 Filed Weights   10/27/20 1225  Weight: 84.8 kg   Examination:  s/p fentanyl and versed General:  Well nourished, older male currently sedated and intubated on MV HEENT: MM  pink/slightly dry, pupils 3/reactive, anicteric, ETT Neuro:  sedated  CV: rr, difficult to auscultate heart sounds over loud breath sounds PULM:  non labored on MV, diffuse rales no wheeze GI: obese, +bs, soft, foley- cyu Extremities: warm/dry, no LE edema, posterior not examined. Skin: no rashes  Resolved Hospital Problem list     Assessment & Plan:   Acute hypoxic respiratory failure secondary to pulmonary edema, felt more negative pressure but r/o cardiac component - Continue MV support, 8cc/kg IBW with goal Pplat <30 and DP<15  - VAP prevention protocol/ PPI - PAD protocol for sedation> precedex with prn fentanyl, RASS goal 0/-1 - wean FiO2/ PEEP  as able for SpO2 >92%  - SAT & SBT in am if edema resolved  - pending assessment of renal status, will dose lasix - prn BD - will hold off on OGT placement, in anticipation of hopeful extubation in the morning.  Has been NPO since midnight.    Hx HTN, CAD w/ previous MI/ stent, HLD - currently normotensive - holding home ASA, lisinopril, metoprolol, and lasix (20mg  daily) - checking BNP, troponin hs and EKG   Prolapsed Internal Hemorrhoids s/p elective uncomplicated hemorrhoidectomy - per [facility] - prn hydrocortisone- pramoxine   DM - CBG q 4, add SSI if > 180 - hold home metformin    OSA on CPAP - resume CPAP post extubation   Best Practice (right click and "Reselect all SmartList Selections" daily)   Diet/type:  NPO DVT prophylaxis: prophylactic heparin  GI prophylaxis: PPI Lines: N/A Foley:  Yes, and it is still needed Code Status:  full code Last date of multidisciplinary goals of care discussion [10/19 with wife Cala Bradford [phone]]  Labs   CBC: No results for input(s): WBC, NEUTROABS, HGB, HCT, MCV, PLT in the last 168 hours.  Basic Metabolic Panel: No results for input(s): NA, K, CL, CO2, GLUCOSE, BUN, CREATININE, CALCIUM, MG, PHOS in the last 168 hours. GFR: CrCl cannot be calculated (Patient's most  recent lab result is older than the maximum 21 days allowed.). No results for input(s): PROCALCITON, WBC, LATICACIDVEN in the last 168 hours.  Liver Function Tests: No results for input(s): AST, ALT, ALKPHOS, BILITOT, PROT, ALBUMIN in the last 168 hours. No results for input(s): LIPASE, AMYLASE in the last 168 hours. No results for input(s): AMMONIA in the last 168 hours.  ABG No results found for: PHART, PCO2ART, PO2ART, HCO3, TCO2, ACIDBASEDEF, O2SAT   Coagulation Profile: No results for input(s): INR, PROTIME in the last 168 hours.  Cardiac Enzymes: No results for input(s): CKTOTAL, CKMB, CKMBINDEX, TROPONINI in the last 168 hours.  HbA1C: Hgb A1c MFr Bld  Date/Time Value Ref Range Status  04/11/2016 09:35 AM 6.1 (H) 4.8 - 5.6 % Final    Comment:             Pre-diabetes: 5.7 - 6.4          Diabetes: >6.4          Glycemic control for adults with diabetes: <7.0   11/25/2008 02:57 PM 6.1 4.6 - 6.5 % Final    Comment:    See lab report for associated comment(s)    CBG: No results for input(s): GLUCAP in the last 168 hours.  Review of Systems:   Unable   Past Medical History:  He,  has a past medical history of Coronary artery disease, Diabetes mellitus (HCC), Erectile dysfunction, Hyperlipidemia, Hypertension, MI (mitral incompetence), Myocardial infarction (HCC) (2016), Sleep apnea, and URI (upper respiratory infection) (08/18/2011).   Surgical History:   Past Surgical History:  Procedure Laterality Date   COLONOSCOPY  2012   negative   COLONOSCOPY WITH PROPOFOL N/A 06/09/2020   Procedure: COLONOSCOPY WITH PROPOFOL;  Surgeon: Dolores Frame, MD;  Location: [facility];  Service: Gastroenterology;  Laterality: N/A;  10:05   LEFT HEART CATH AND CORONARY ANGIOGRAPHY N/A 02/14/2017   Procedure: LEFT HEART CATH AND CORONARY ANGIOGRAPHY;  Surgeon: Lennette Bihari, MD;  Location: [facility] INVASIVE CV LAB;  Service: Cardiovascular;  Laterality: N/A;   WRIST SURGERY      fractured wrist, Dr. Amanda Pea     Social History:   reports that he quit smoking about 17 years ago. He has never used smokeless tobacco. He reports current alcohol use. He reports current drug use. Drug: Marijuana.   Family History:  His family history includes CAD (age of onset: 42) in his brother; CAD (age of onset: 67) in his brother; Colon cancer in his father; Coronary artery disease (age of onset: 28) in his father; Hypertension in his brother; Pernicious anemia in his maternal grandmother; Stroke in his maternal grandfather; Stroke (age of onset: 83) in his mother.   Allergies No Known Allergies   Home Medications  Prior to Admission medications   Medication Sig Start Date End Date Taking? Authorizing Provider  aspirin EC 81 MG tablet Take 81 mg by mouth in the morning. Swallow whole.   Yes [provider]  atorvastatin (LIPITOR) 40 MG tablet TAKE 1 TABLET BY MOUTH ONCE DAILY Patient taking differently: Take 40 mg by mouth every evening. 01/01/17  Yes Rollene Rotunda, MD  furosemide (LASIX) 20 MG tablet Take 20 mg by mouth in the morning. 06/30/17  Yes [provider]  lisinopril (PRINIVIL,ZESTRIL) 5 MG tablet TAKE 1 TABLET BY MOUTH ONCE DAILY Patient taking differently: Take 5 mg by mouth in the morning. 04/16/17  Yes Rollene Rotunda, MD  metFORMIN (GLUCOPHAGE) 500 MG tablet TAKE 1 TABLET BY MOUTH ONCE DAILY Patient taking differently: Take 500 mg by mouth every evening. 04/16/17  Yes Rollene Rotunda, MD  metoprolol succinate (TOPROL-XL) 100 MG 24 hr tablet TAKE 1 TABLET BY MOUTH ONCE DAILY WITH  OR  IMMEDIATELY  FOLLOWING  A  MEAL Patient taking differently: Take 100 mg by mouth in the morning. 04/16/17  Yes Rollene Rotunda, MD  nitroGLYCERIN (NITROSTAT) 0.4 MG SL tablet Place 1 tablet (0.4 mg total) under the tongue every 5 (five) minutes as needed. 04/20/20  Yes Marjie Skiff E, PA-C  potassium chloride (K-DUR) 10 MEQ tablet Take 10 mEq by mouth in the morning.  05/08/17  Yes [provider]     Critical care time: 50 mins       Posey Boyer, ACNP [facility] 10/27/2020, 1:30 PM  See Amion for pager If no response to pager, please call PCCM consult pager After 7:00 pm call Elink

## 2020-10-27 NOTE — Progress Notes (Signed)
   Notified of troponin hs 250 > 593  EKG with NSR, no acute changes.    - mild troponin leak likely due to demand in the absence of EKG changes.  Will continue monitor, trend till peaks.  - ongoing tele monitoring       Posey Boyer, ACNP [facility] 10/27/2020, 5:15 PM  See Amion for pager If no response to pager, please call PCCM consult pager After 7:00 pm call Elink

## 2020-10-27 NOTE — Telephone Encounter (Signed)
Patient s/p elective hemorrhoidectomy at [facility] by me this AM  Uneventul case (OR note below).  Patient extubated in operating room.  Then became hypoxic with difficulty breathing.  Not salvaged with airway measures so reintubated.  Hypoxic at first with increased pink secretions suspicious for negative pressure pulmonary edema.  100% oxygenation.  Sats from 80s now into the high 90s.  However patient not waking up well.  Chest x-ray concerning for pulmonary edema.  Patient's wife, Cala Bradford, updated by anesthesia and myself.  She was appreciative of care.    Felt by the anesthesia team not amenable to early extubation.  They felt the patient needs to be transferred to a hospital for longer monitoring and hopeful extubation in the next 24 hours.   Anesthesia called emergency department who recommended a direct admission to the ICU.  Anesthesia asked me to help facilitate this.  I called Dr. Jasmine Awe who is a critical care attending at [facility], ICU where I tend to work.  He notes their are beds at [facility] available.  Dr. Johnna Acosta recommended I call [facility].   I called [facility] & they recommend I first discuss with bed control.  Bed control was able to get an L ICU room: 1223.  Anesthesia team updated by me.  Emergent medical services are here and are anticipating transferring the patient.  Called and discussed with Janeann Forehand, nurse practitioner who is taking consults and admissions for pulmonary critical care.  She will update [facility] on the status.  Because his issues seem to be primarily pulmonary, I think it would be wisest for critical care to run the show with myself following to make sure there are no issues with his hemorrhoidectomy.  Please see my H&P through care everywhere for further details.  He does have a distant history of myocardial infarction and cardiac disease but has good exercise tolerance with good cardiac function and normal ejection fraction  echocardiogram 2019.  Followed by Dr. Antoine Poche and the cardiology team in the past.  Did have clearance preop.  Wife thinks he has sleep apnea and CPAP  OR note below:  OPERATIVE REPORT [facility] Account #: [id number] Patient Name: [name] [name], [name] [name] MRN: [id number] Sex: Male Date of Birth: 12-15-1950 Proceduralist/Surgeon: Karie Soda MD ([id number]) Date of Procedure: 10/27/2020 Admitting Provider: , Attending Provider: Michaell Cowing MD, Salvatore Decent Referring Provider: ,   PRE-OPERATIVE DIAGNOSES: Internal hemorrhoids Fourth degree(K64.3) POST-OPERATIVE DIAGNOSES: Internal hemorrhoids Fourth degree(K64.3) PROCEDURES PERFORMED: Hemorrhoidectomy Columns/Groups: 3 Location: Internal and External Technique: Excision without Fissurectomy and Fistulectomy ASSISTANTS: Puja Gosai, PA-C ESTIMATED BLOOD LOSS: 58mL  FINDINGS: Large left lateral Grade 4 internal/external hemorrhoid > Large Grade 4 right posterior hemorrhoid > Grade 3 right anterior internal hemorrhoid - ligation/pexy & excision hemorrhoidectomies done  SPECIMEN REMOVED: Hemorrhoid(s) COMPLICATIONS: No complications were noted ANESTHESIA: General Experel mixed with 0.25% bupivicaine with epinephrine in a bilateral anorectal & field block IMPLANTS: None  INDICATIONS: Patient also with large internal & external hemorrhoids with pain. I recommended ligation with possible removal:  The anatomy & physiology of the anorectal region was discussed. The pathophysiology of hemorrhoids and differential diagnosis was discussed. Natural history risks without surgery was discussed. I stressed the importance of a bowel regimen to have daily soft bowel movements to minimize progression of disease. Interventions such as sclerotherapy & banding were discussed.  The patient's symptoms are not adequately controlled by medicines and other non-operative treatments. I feel the risks & problems of no surgery outweigh the  operative risks; therefore, I recommended surgery to treat the hemorrhoids by ligation, pexy, and possible resection.  Risks such as bleeding, infection, urinary difficulties, injury to other organs, need for repair of tissues / organs, need for further treatment, heart attack, death, and other risks were discussed. I noted a good likelihood this will help address the problem. Goals of post-operative recovery were discussed as well. Possibility that this will not correct all symptoms was explained. Post-operative pain, bleeding, constipation, and other problems after surgery were discussed. We will work to minimize complications. Educational handouts further explaining the pathology, treatment options, and bowel regimen were given as well. Questions were answered. The patient expresses understanding & wishes to proceed with surgery.   TECHNIQUE: Informed consent was confirmed. Patient underwent general anesthesia without difficulty. Patient was placed into prone positioning. The perianal region was prepped and draped in sterile fashion. Surgical time-out confirmed our plan.  I did digital rectal examination and then transitioned over to anoscopy to get a sense of the anatomy. Findings noted above. Redundant rectal wall & very inflamed & friable internal hemorrhoids.  Parks anorectal retractor placed. I started at the left lateral hemorrhoid since it was Grade 4 & largest. I used a 2-0 Vicryl suture on a UR-6 needle in a figure-of-eight fashion 6 cm proximal to the anal verge. I then excised the inflamed internal hemorrhoidal pile longitudinally & excised the external components en bloc, taking care to spare anoderm at the anal verge I then ran that stitch longitudinally more distally to close the hemorrhoidectomy wound to the anal verge. I then tied that stitch down to cause a hemorrhoidopexy. I then ligated the remaining 5 columns of the hemorrhoidal arteries such that the hexagonal locations were ligated  (right posterior/lateral/anterior, left posterior /lateral/anterior). I had to excise internal/external components at the right posterior & internal only at the right anterior locations as well. This help ligate down hemorrhoid piles. I exiced & closed the external hemorrhoidectomy wounds with horizontal mattress chromic sutures at the left lateral & right posterior locations - I did these over a large Hill-Furgeson retractor to avoid narrowing of the anorectal canal.  I redid anoscopy. At completion of this, all hemorrhoids were either removed or reduced into the rectum. There is no prolapse. No stricturing. External anatomy looked much more normal. Patient is being extubated go to go to the recovery room.  I had discussed postop care in detail with the patient in the preop holding area. Instructions for post-operative recovery and prescriptions are written. I called & got voice mail for his spouse, Cala Bradford. I left a message Written instructions for post-op care written & e-Rx sent. DISPOSITION: The patient tolerated the procedure well without any complications and was transferred to recovery.    Electronically signed by Karie Soda MD 10/27/2020 09:54 EDT  ###############################    Ardeth Sportsman, MD, FACS, MASCRS Esophageal, Gastrointestinal & Colorectal Surgery Robotic and Minimally Invasive Surgery  [facility], [facility]  [facility]  [address] [place], [place] [address] [phone] Fax [phone] Main  CONTACT INFORMATION:  Weekday (9AM-5PM): Call [facility] main office at [phone]  Weeknight (5PM-9AM) or Weekend/Holiday: Check [email or web address] (password " [email or web address]") for General Surgery [facility] coverage  (Please, do not use SecureChat as it is not reliable communication to operating surgeons for immediate patient care)

## 2020-10-27 NOTE — Progress Notes (Signed)
Chaplain engaged in an initial visit with Mrs. Crance at "Billie's" bedside.  She shared about the events that happened today concerning his surgery.  She voiced that she is still in a daze.  She expected Mr. Lipsett to return home after his procedure.  Though today has been so jarring for her, she voiced that she is thankful for how fast the team acted and the care and warmth the ICU has provided.    Mrs. Speltz shared that both she and her husband are retired and that they live on a farm, taking care of her parents.  Mrs. Grieser retired from working in [place].  They have been caregivers to her parents who are in their late 80's, with her dad being on hospice.  Mrs. Borenstein is already thinking about ways to rearrange her schedule to be there for her husband and parents.    Mrs. Dowding also noted that her and Willaim Sheng have a grandson on the way.  They are both so excited about being grandparents for the first time.    Chaplain offered support, presence, and listening.  Chaplain will continue to follow-up.    10/27/20 1418  Clinical Encounter Type  Visited With Patient and family together  Visit Type Initial  Advance Directives (For Healthcare)  Does Patient Have a Medical Advance Directive? No  Would patient like information on creating a medical advance directive? No - Patient declined  Mental Health Advance Directives  Does Patient Have a Mental Health Advance Directive? No  Would patient like information on creating a mental health advance directive? No - Patient declined

## 2020-10-27 NOTE — Progress Notes (Signed)
Patient transferred from [facility] to [facility], ICU.  Critical care nurse, respiratory therapy, wife at bedside.  Patient on vent.  Resting.  Work-up continuing.  No hemodynamic decline.  Weaning on vent.  Sats in the high 90s on 100% FiO2  Discussed with patient at bedside with nursing present.  Discussed with Selmer Dominion, PCCM critical care nurse practitioner who is evaluating the patient.  Help appreciated.  I believe they are working to rule out any cardiopulmonary etiologies.  Most likely will plan to rest him and wean on extubation in the morning if things go well.  Ardeth Sportsman, MD, FACS, MASCRS Esophageal, Gastrointestinal & Colorectal Surgery Robotic and Minimally Invasive Surgery  [facility], [facility]  [facility]  [address] [place], [place] [address] [phone] Fax [phone] Main  CONTACT INFORMATION:  Weekday (9AM-5PM): Call [facility] main office at [phone]  Weeknight (5PM-9AM) or Weekend/Holiday: Check [email or web address] (password " [email or web address]") for General Surgery [facility] coverage  (Please, do not use SecureChat as it is not reliable communication to operating surgeons for immediate patient care)

## 2020-10-27 NOTE — H&P (Signed)
NAME:  [name] [name], MRN:  [id number], DOB:  1950-12-20, LOS: 0 ADMISSION DATE:  10/27/2020, CONSULTATION DATE:  10/27/2020 REFERRING MD:  Dr. Michaell Cowing, CHIEF COMPLAINT:  Hypoxic resp failure  History of Present Illness:  70 year old male with prior history OSA compliant with CPAP, OSA on CPAP, HTN, CAD with MI 2016 with BMS, DMT2, and HLD presenting from [facility] with respiratory failure.   Patient underwent hemorrhoidectomy by Dr. Michaell Cowing at [facility] which was uncomplicated; he was prone for around 1.5 hrs.  He was extubated in PACU but developed respiratory distress, became hypoxic with pink frothy secretions suspicious for negative pressure pulmonary edema.  He was reintubated by anesthesia with CXR showing pulmonary edema.  Therefore he was transferred to [facility] ICU by [facility] for further care.  PCCM accepting.   Wife reports patient was in his normal state of health prior to surgery.  No reports of fever, URI, chest pain, leg swelling, or shortness of breath.   Pertinent  Medical History  OSA on CPAP, HTN, CAD with MI 2016 with BMS, DMT2, HLD, ED  Significant Hospital Events: Including procedures, antibiotic start and stop dates in addition to other pertinent events   10/19 uncomplicated hemorrhoidectomy at Surgical center > developed pulmonary edema post extubation, requiring reintubation, tx to [facility] ICU  Interim History / Subjective:  Arrived intubated, sats in the 80's requiring increased PEEP 8 and FiO2 1.0 to get sats up  Objective   Blood pressure (!) 83/61, pulse 61, temperature (!) 97.5 F (36.4 C), temperature source Oral, resp. rate 14, height 6' (1.829 m), weight 84.8 kg, SpO2 97 %.    Vent Mode: PRVC FiO2 (%):  [100 %] 100 % Set Rate:  [16 bmp] 16 bmp Vt Set:  [620 mL] 620 mL PEEP:  [8 cmH20] 8 cmH20   Intake/Output Summary (Last 24 hours) at 10/27/2020 1347 Last data filed at 10/27/2020 1313 Gross per 24 hour  Intake 0.45 ml  Output --  Net 0.45 ml    Filed Weights   10/27/20 1225  Weight: 84.8 kg   Examination:  s/p fentanyl and versed General:  Well nourished, older male currently sedated and intubated on MV HEENT: MM pink/slightly dry, pupils 3/reactive, anicteric, ETT Neuro:  sedated  CV: rr, difficult to auscultate heart sounds over loud breath sounds PULM:  non labored on MV, diffuse rales no wheeze GI: obese, +bs, soft, foley- cyu Extremities: warm/dry, no LE edema, posterior not examined. Skin: no rashes  Resolved Hospital Problem list     Assessment & Plan:   Acute hypoxic respiratory failure secondary to pulmonary edema, felt more negative pressure but r/o cardiac component - Continue MV support, 8cc/kg IBW with goal Pplat <30 and DP<15  - VAP prevention protocol/ PPI - PAD protocol for sedation> precedex with prn fentanyl, RASS goal 0/-1 - wean FiO2/ PEEP  as able for SpO2 >92%  - SAT & SBT in am if edema resolved  - pending assessment of renal status, will dose lasix - prn BD - will hold off on OGT placement, in anticipation of hopeful extubation in the morning.  Has been NPO since midnight.    Hx HTN, CAD w/ previous MI/ stent, HLD - most recent TTE 06/2020 with LVEF 65-70%, G1DD, normal RV, no valvular issues - currently normotensive - holding home ASA, lisinopril, metoprolol, and lasix (20mg  daily) - checking BNP, troponin hs and EKG   Prolapsed Internal Hemorrhoids s/p elective uncomplicated hemorrhoidectomy - per [facility] -  prn hydrocortisone- pramoxine - foley to be left placed overnight for possible urinary retention; plan to remove in am.   DM - CBG q 4, add SSI if > 180 - hold home metformin    OSA on CPAP - resume CPAP post extubation   Best Practice (right click and "Reselect all SmartList Selections" daily)   Diet/type: NPO DVT prophylaxis: prophylactic heparin  GI prophylaxis: PPI Lines: N/A Foley:  Yes, and it is still needed Code Status:  full code Last date of multidisciplinary  goals of care discussion [10/19 with wife Cala Bradford [phone]]   Labs   CBC: Recent Labs  Lab 10/27/20 1303  WBC 19.4*  NEUTROABS 17.7*  HGB 17.1*  HCT 54.7*  MCV 94.3  PLT 243    Basic Metabolic Panel: No results for input(s): NA, K, CL, CO2, GLUCOSE, BUN, CREATININE, CALCIUM, MG, PHOS in the last 168 hours. GFR: CrCl cannot be calculated (Patient's most recent lab result is older than the maximum 21 days allowed.). Recent Labs  Lab 10/27/20 1303  WBC 19.4*    Liver Function Tests: No results for input(s): AST, ALT, ALKPHOS, BILITOT, PROT, ALBUMIN in the last 168 hours. No results for input(s): LIPASE, AMYLASE in the last 168 hours. No results for input(s): AMMONIA in the last 168 hours.  ABG No results found for: PHART, PCO2ART, PO2ART, HCO3, TCO2, ACIDBASEDEF, O2SAT   Coagulation Profile: No results for input(s): INR, PROTIME in the last 168 hours.  Cardiac Enzymes: No results for input(s): CKTOTAL, CKMB, CKMBINDEX, TROPONINI in the last 168 hours.  HbA1C: Hgb A1c MFr Bld  Date/Time Value Ref Range Status  04/11/2016 09:35 AM 6.1 (H) 4.8 - 5.6 % Final    Comment:             Pre-diabetes: 5.7 - 6.4          Diabetes: >6.4          Glycemic control for adults with diabetes: <7.0   11/25/2008 02:57 PM 6.1 4.6 - 6.5 % Final    Comment:    See lab report for associated comment(s)    CBG: No results for input(s): GLUCAP in the last 168 hours.  Review of Systems:   Unable   Past Medical History:  He,  has a past medical history of Coronary artery disease, Diabetes mellitus (HCC), Erectile dysfunction, Hyperlipidemia, Hypertension, MI (mitral incompetence), Myocardial infarction (HCC) (2016), Sleep apnea, and URI (upper respiratory infection) (08/18/2011).   Surgical History:   Past Surgical History:  Procedure Laterality Date   COLONOSCOPY  2012   negative   COLONOSCOPY WITH PROPOFOL N/A 06/09/2020   Procedure: COLONOSCOPY WITH PROPOFOL;  Surgeon:  Dolores Frame, MD;  Location: [facility];  Service: Gastroenterology;  Laterality: N/A;  10:05   LEFT HEART CATH AND CORONARY ANGIOGRAPHY N/A 02/14/2017   Procedure: LEFT HEART CATH AND CORONARY ANGIOGRAPHY;  Surgeon: Lennette Bihari, MD;  Location: [facility] INVASIVE CV LAB;  Service: Cardiovascular;  Laterality: N/A;   WRIST SURGERY     fractured wrist, Dr. Amanda Pea     Social History:   reports that he quit smoking about 17 years ago. He has never used smokeless tobacco. He reports current alcohol use. He reports current drug use. Drug: Marijuana.   Family History:  His family history includes CAD (age of onset: 22) in his brother; CAD (age of onset: 76) in his brother; Colon cancer in his father; Coronary artery disease (age of onset: 34) in his father;  Hypertension in his brother; Pernicious anemia in his maternal grandmother; Stroke in his maternal grandfather; Stroke (age of onset: 66) in his mother.   Allergies No Known Allergies   Home Medications  Prior to Admission medications   Medication Sig Start Date End Date Taking? Authorizing Provider  aspirin EC 81 MG tablet Take 81 mg by mouth in the morning. Swallow whole.   Yes [provider]  atorvastatin (LIPITOR) 40 MG tablet TAKE 1 TABLET BY MOUTH ONCE DAILY Patient taking differently: Take 40 mg by mouth every evening. 01/01/17  Yes Rollene Rotunda, MD  furosemide (LASIX) 20 MG tablet Take 20 mg by mouth in the morning. 06/30/17  Yes [provider]  lisinopril (PRINIVIL,ZESTRIL) 5 MG tablet TAKE 1 TABLET BY MOUTH ONCE DAILY Patient taking differently: Take 5 mg by mouth in the morning. 04/16/17  Yes Rollene Rotunda, MD  metFORMIN (GLUCOPHAGE) 500 MG tablet TAKE 1 TABLET BY MOUTH ONCE DAILY Patient taking differently: Take 500 mg by mouth every evening. 04/16/17  Yes Rollene Rotunda, MD  metoprolol succinate (TOPROL-XL) 100 MG 24 hr tablet TAKE 1 TABLET BY MOUTH ONCE DAILY WITH  OR  IMMEDIATELY  FOLLOWING  A   MEAL Patient taking differently: Take 100 mg by mouth in the morning. 04/16/17  Yes Rollene Rotunda, MD  nitroGLYCERIN (NITROSTAT) 0.4 MG SL tablet Place 1 tablet (0.4 mg total) under the tongue every 5 (five) minutes as needed. 04/20/20  Yes Marjie Skiff E, PA-C  potassium chloride (K-DUR) 10 MEQ tablet Take 10 mEq by mouth in the morning. 05/08/17  Yes [provider]     Critical care time: 50 mins       Posey Boyer, ACNP [facility] 10/27/2020, 1:30 PM  See Amion for pager If no response to pager, please call PCCM consult pager After 7:00 pm call Elink

## 2020-10-28 ENCOUNTER — Other Ambulatory Visit: Payer: Self-pay

## 2020-10-28 ENCOUNTER — Inpatient Hospital Stay (HOSPITAL_COMMUNITY): Payer: Medicare Other

## 2020-10-28 DIAGNOSIS — J9690 Respiratory failure, unspecified, unspecified whether with hypoxia or hypercapnia: Secondary | ICD-10-CM | POA: Diagnosis not present

## 2020-10-28 DIAGNOSIS — I25119 Atherosclerotic heart disease of native coronary artery with unspecified angina pectoris: Secondary | ICD-10-CM | POA: Diagnosis not present

## 2020-10-28 DIAGNOSIS — I214 Non-ST elevation (NSTEMI) myocardial infarction: Secondary | ICD-10-CM

## 2020-10-28 DIAGNOSIS — N183 Chronic kidney disease, stage 3 unspecified: Secondary | ICD-10-CM

## 2020-10-28 LAB — GLUCOSE, CAPILLARY
Glucose-Capillary: 139 mg/dL — ABNORMAL HIGH (ref 70–99)
Glucose-Capillary: 119 mg/dL — ABNORMAL HIGH (ref 70–99)
Glucose-Capillary: 124 mg/dL — ABNORMAL HIGH (ref 70–99)
Glucose-Capillary: 108 mg/dL — ABNORMAL HIGH (ref 70–99)
Glucose-Capillary: 104 mg/dL — ABNORMAL HIGH (ref 70–99)
Glucose-Capillary: 112 mg/dL — ABNORMAL HIGH (ref 70–99)

## 2020-10-28 LAB — CBC
HCT: 47 % (ref 39.0–52.0)
Hemoglobin: 14.9 g/dL (ref 13.0–17.0)
MCH: 29.3 pg (ref 26.0–34.0)
MCHC: 31.7 g/dL (ref 30.0–36.0)
MCV: 92.3 fL (ref 80.0–100.0)
Platelets: 220 10*3/uL (ref 150–400)
RBC: 5.09 MIL/uL (ref 4.22–5.81)
RDW: 15.8 % — ABNORMAL HIGH (ref 11.5–15.5)
WBC: 17.3 10*3/uL — ABNORMAL HIGH (ref 4.0–10.5)
nRBC: 0 % (ref 0.0–0.2)

## 2020-10-28 LAB — BASIC METABOLIC PANEL
Anion gap: 12 (ref 5–15)
BUN: 25 mg/dL — ABNORMAL HIGH (ref 8–23)
CO2: 18 mmol/L — ABNORMAL LOW (ref 22–32)
Calcium: 8.5 mg/dL — ABNORMAL LOW (ref 8.9–10.3)
Chloride: 109 mmol/L (ref 98–111)
Creatinine, Ser: 1.48 mg/dL — ABNORMAL HIGH (ref 0.61–1.24)
GFR, Estimated: 51 mL/min — ABNORMAL LOW (ref >60)
Glucose, Bld: 121 mg/dL — ABNORMAL HIGH (ref 70–99)
Potassium: 4.5 mmol/L (ref 3.5–5.1)
Sodium: 139 mmol/L (ref 135–145)

## 2020-10-28 LAB — TROPONIN I (HIGH SENSITIVITY)
Troponin I (High Sensitivity): 1685 ng/L (ref <18)
Troponin I (High Sensitivity): 3005 ng/L (ref <18)
Troponin I (High Sensitivity): 2726 ng/L (ref <18)
Troponin I (High Sensitivity): 2241 ng/L (ref <18)

## 2020-10-28 LAB — HIV ANTIBODY (ROUTINE TESTING W REFLEX): HIV Screen 4th Generation wRfx: NONREACTIVE

## 2020-10-28 MED ORDER — ASPIRIN 81 MG PO CHEW
81.0000 mg | CHEWABLE_TABLET | Freq: Once | ORAL | Status: AC
  Administered 2020-10-28: 81 mg via ORAL
  Filled 2020-10-28: qty 1

## 2020-10-28 MED ORDER — SODIUM CHLORIDE 0.9% FLUSH
3.0000 mL | Freq: Two times a day (BID) | INTRAVENOUS | Status: DC
  Administered 2020-10-28 – 2020-10-31 (×4): 3 mL via INTRAVENOUS

## 2020-10-28 MED ORDER — NITROGLYCERIN 0.4 MG SL SUBL
0.4000 mg | SUBLINGUAL_TABLET | SUBLINGUAL | Status: DC | PRN
  Administered 2020-10-28 (×3): 0.4 mg via SUBLINGUAL
  Filled 2020-10-28: qty 1

## 2020-10-28 MED ORDER — NITROGLYCERIN IN D5W 200-5 MCG/ML-% IV SOLN
0.0000 ug/min | INTRAVENOUS | Status: DC
  Administered 2020-10-28: 5 ug/min via INTRAVENOUS
  Administered 2020-10-29 (×2): 105 ug/min via INTRAVENOUS
  Administered 2020-10-29: 95 ug/min via INTRAVENOUS
  Administered 2020-10-30: 100 ug/min via INTRAVENOUS
  Filled 2020-10-28 (×4): qty 250

## 2020-10-28 MED ORDER — CHLORHEXIDINE GLUCONATE 0.12 % MT SOLN
OROMUCOSAL | Status: AC
  Filled 2020-10-28: qty 15

## 2020-10-28 MED ORDER — ORAL CARE MOUTH RINSE
15.0000 mL | Freq: Two times a day (BID) | OROMUCOSAL | Status: DC
  Administered 2020-10-28 – 2020-10-30 (×2): 15 mL via OROMUCOSAL

## 2020-10-28 MED ORDER — AMLODIPINE BESYLATE 5 MG PO TABS
5.0000 mg | ORAL_TABLET | Freq: Every day | ORAL | Status: DC
  Administered 2020-10-28 – 2020-10-31 (×4): 5 mg via ORAL
  Filled 2020-10-28 (×4): qty 1

## 2020-10-28 MED ORDER — NITROGLYCERIN 0.4 MG SL SUBL
SUBLINGUAL_TABLET | SUBLINGUAL | Status: AC
  Filled 2020-10-28: qty 3

## 2020-10-28 MED ORDER — CALCIUM POLYCARBOPHIL 625 MG PO TABS
625.0000 mg | ORAL_TABLET | Freq: Two times a day (BID) | ORAL | Status: DC
  Administered 2020-10-28 – 2020-10-31 (×7): 625 mg via ORAL
  Filled 2020-10-28 (×9): qty 1

## 2020-10-28 MED ORDER — METOPROLOL TARTRATE 5 MG/5ML IV SOLN
5.0000 mg | Freq: Once | INTRAVENOUS | Status: AC
  Administered 2020-10-28: 5 mg via INTRAVENOUS
  Filled 2020-10-28: qty 5

## 2020-10-28 MED ORDER — METOPROLOL TARTRATE 25 MG PO TABS
25.0000 mg | ORAL_TABLET | Freq: Two times a day (BID) | ORAL | Status: DC
  Administered 2020-10-28 – 2020-10-31 (×7): 25 mg via ORAL
  Filled 2020-10-28 (×7): qty 1

## 2020-10-28 MED ORDER — HYDRALAZINE HCL 20 MG/ML IJ SOLN
20.0000 mg | Freq: Once | INTRAMUSCULAR | Status: AC
  Administered 2020-10-28: 20 mg via INTRAVENOUS
  Filled 2020-10-28: qty 1

## 2020-10-28 MED ORDER — OXYCODONE HCL 5 MG PO TABS
5.0000 mg | ORAL_TABLET | ORAL | Status: DC | PRN
Start: 2020-10-28 — End: 2020-10-31
  Administered 2020-10-29: 10 mg via ORAL

## 2020-10-28 MED ORDER — ACETAMINOPHEN 500 MG PO TABS
1000.0000 mg | ORAL_TABLET | Freq: Four times a day (QID) | ORAL | Status: DC
  Administered 2020-10-28 – 2020-10-31 (×11): 1000 mg via ORAL
  Filled 2020-10-28 (×11): qty 2

## 2020-10-28 MED ORDER — METOPROLOL TARTRATE 5 MG/5ML IV SOLN
2.5000 mg | INTRAVENOUS | Status: DC | PRN
  Administered 2020-10-28: 5 mg via INTRAVENOUS
  Filled 2020-10-28: qty 5

## 2020-10-28 NOTE — Progress Notes (Addendum)
While ambulating to bathroom with NT, pt became SOB with an elevated RR. When RN entered the room, patient's O2 was 83% on 6L [place]. Pt placed pt on NRBM as well, and patient's O2 sat quickly recovered to 97%.   After several minutes, patient reported chest pain to RN and stated "I haven't felt this since my heart attack years ago'. This RN obtained EKG per protocol and notified Canary Brim, Critical care NP. EKG showed NSR with occasional PVCs and prolonged QT. Pt did not develop diaphoresis or c/o radiating pain.   Orders received to give metoprolol and ASA d/t SBP >180. This RN also received a critical Troponin, 1685. Sequential troponins ordered by CCM.   Patient's HR currently 74 bpm, O2 sat 100%, and BP 171/108 (125). This RN will continue to carefully monitor pt for chest pain, SOB, and HTN.

## 2020-10-28 NOTE — Progress Notes (Addendum)
Notified by RN of pt complaint of chest pain.  He is hypertensive.  Has been off home BP agents since admission.  Had negative pressure pulmonary edema post-operatively, remained intubated briefly.  Hypertension treated with PRN IV lopressor x1, restarted home lopressor at reduced dose.  Now is complaining of left chest discomfort which he rates 1/10.  Repeat troponin this am 1605, then rose to 3005.  May be demand ischemia from HTN.  R/o ACS.    Plan: -Assess EKG again to ensure no acute changes  -now NTG x1  -repeat lopressor IV  -Cardiology consulted, appreciate assistance with patient care -received 81mg  ASA -hold further lasix with AKI -hold home ACE -add norvasc PO  -hydralazine 20 mg IV x1     , MSN, APRN, NP-C, AGACNP-BC [facility] 10/28/2020, 2:32 PM   Please see [email or web address] for pager details.   From 7A-7P if no response, please call [phone] After hours, please call ELink [phone]

## 2020-10-28 NOTE — Progress Notes (Addendum)
[name] [name] [id number] Aug 10, 1950  CARE TEAM:  PCP: Benita Stabile, MD  Outpatient Care Team: Patient Care Team: Benita Stabile, MD as PCP - General (Internal Medicine) Lennette Bihari, MD as PCP - Cardiology (Cardiology) Karie Soda, MD as Consulting Physician (General Surgery) Marguerita Merles, Reuel Boom, MD as Consulting Physician (Gastroenterology) Rollene Rotunda, MD as Consulting Physician (Cardiology)  Inpatient Treatment Team: Treatment Team: Attending Provider: Leslye Peer, MD; Consulting Physician: Karie Soda, MD; Attending Physician: Pccm, Md, MD; Registered Nurse: Carlynn Herald, RN; Registered Nurse: Duncan Dull, RN; Registered Nurse: Wonda Cheng, RN; Technician: Marcelle Smiling, NT; Utilization Review: Carvel Getting, RN; Registered Nurse: Elmer Sow, RN   Problem List:   Principal Problem:   Respiratory failure requiring intubation [facility]) Active Problems:   Obesity (BMI 30.0-34.9)   Coronary artery disease involving native coronary artery of native heart with angina pectoris (HCC)   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  No evidence of cardiac strain so far  Foley can come out upon extubation.  Local anorectal care with oral and topical pain control.  Sitz bath's.  Most likely can advance diet once extubated  -VTE prophylaxis- SCDs.  Heparin OK   -mobilize as tolerated to help recovery after extubation   Disposition: Per primary pulmonary service.  HELP APPRECIATED!     30 minutes spent in review, evaluation, examination, counseling, and coordination of care.   I have reviewed this patient's available data,  including medical history, events of note, physical examination and test results as part of my evaluation.  A significant portion of that time was spent in counseling.  Care during the described time interval was provided by me.  10/28/2020    Subjective: (Chief complaint)  Patient awake on vent following commands.  Right and alert.  Nursing in room.  FiO2 mostly weaned down.  Diuresis x1 by critical care  Objective:  Vital signs:  Vitals:   10/28/20 0400 10/28/20 0500 10/28/20 0600 10/28/20 0700  BP: (!) 124/55 (!) 131/59 128/65 122/60  Pulse: (!) 59 (!) 46 65 (!) 57  Resp: 18 17 20 16   Temp:      TempSrc:      SpO2: 98% 97% 98% 98%  Weight:  108.1 kg    Height:        Last BM Date:  (PTA)  Intake/Output   Yesterday:  10/19 0701 - 10/20 0700 In: 379 [I.V.:379] Out: 1225 [Urine:1225] This shift:  No intake/output data recorded.  Bowel function:  Flatus: YES  BM:  No  Drain: (No drain)   Physical Exam:  General: Pt awake/alert in no acute distress.  Smiling, giving a thumbs up  Eyes: PERRL, normal EOM.  Sclera clear.  No icterus Neuro: CN II-XII intact w/o focal sensory/motor deficits. Lymph: No head/neck/groin lymphadenopathy Psych:  No delerium/psychosis/paranoia.  Oriented x 4 HENT: Normocephalic, Mucus membranes moist.  No thrush.  ET tube in place Neck: Supple, No tracheal deviation.  No obvious thyromegaly Chest: No pain to chest wall compression.  Good respiratory excursion.  No audible wheezing CV:  Pulses intact.  Regular rhythm.  No major extremity edema MS: Normal AROM mjr joints.  No obvious deformity  Abdomen: Soft.  Nondistended.  Mildly tender at incisions only.  No evidence of peritonitis.  No incarcerated hernias.  Ext:   No deformity.  No mjr edema.  No cyanosis Skin: No petechiae / purpurea.  No major sores.  Warm and dry    Results:   Cultures: Recent Results (from the past 720 hour(s))  MRSA Next Gen by PCR, Nasal      Status: None   Collection Time: 10/27/20 12:39 PM   Specimen: Nasal Mucosa; Nasal Swab  Result Value Ref Range Status   MRSA by PCR Next Gen NOT DETECTED NOT DETECTED Final    Comment: (NOTE) The GeneXpert MRSA Assay (FDA approved for NASAL specimens only), is one component of a comprehensive MRSA colonization surveillance program. It is not intended to diagnose MRSA infection nor to guide or monitor treatment for MRSA infections. Test performance is not FDA approved in patients less than 34 years old. Performed at [facility], [address] [place], [place] [address]   Resp Panel by RT-PCR (Flu A&B, Covid) Nasal Mucosa     Status: None   Collection Time: 10/27/20 12:41 PM   Specimen: Nasal Mucosa; Nasopharyngeal(NP) swabs in vial transport medium  Result Value Ref Range Status   SARS Coronavirus 2 by RT PCR NEGATIVE NEGATIVE Final    Comment: (NOTE) SARS-CoV-2 target nucleic acids are NOT DETECTED.  The SARS-CoV-2 RNA is generally detectable in upper respiratory specimens during the acute phase of infection. The lowest concentration of SARS-CoV-2 viral copies this assay can detect is 138 copies/mL. A negative result does not preclude SARS-Cov-2 infection and should not be used as the sole basis for treatment or other patient management decisions. A negative result may occur with  improper specimen collection/handling, submission of specimen other than nasopharyngeal swab, presence of viral mutation(s) within the areas targeted by this assay, and inadequate number of viral copies(<138 copies/mL). A negative result must be combined with clinical observations, patient history, and epidemiological information. The expected result is Negative.  Fact Sheet for Patients:  BloggerCourse.com  Fact Sheet for Healthcare Providers:  SeriousBroker.it  This test is no t yet approved or cleared by the [place] FDA  and  has been authorized for detection and/or diagnosis of SARS-CoV-2 by FDA under an Emergency Use Authorization (EUA). This EUA will remain  in effect (meaning this test can be used) for the duration of the COVID-19 declaration under Section 564(b)(1) of the Act, 21 U.S.C.section 360bbb-3(b)(1), unless the authorization is terminated  or revoked sooner.       Influenza A by PCR NEGATIVE NEGATIVE Final   Influenza B by PCR NEGATIVE NEGATIVE Final    Comment: (NOTE) The Xpert Xpress SARS-CoV-2/FLU/RSV plus assay is intended as an aid in the diagnosis of influenza from Nasopharyngeal swab specimens and should not be used as a sole basis for treatment. Nasal washings and aspirates are unacceptable for Xpert Xpress SARS-CoV-2/FLU/RSV testing.  Fact Sheet for Patients: BloggerCourse.com  Fact Sheet for Healthcare Providers: SeriousBroker.it  This test is not yet approved or cleared by the [place] FDA and has been authorized for detection and/or diagnosis of SARS-CoV-2 by FDA under an Emergency Use Authorization (EUA). This EUA will remain in effect (meaning this test can be used) for the duration of the COVID-19 declaration under Section 564(b)(1) of the Act, 21 U.S.C. section 360bbb-3(b)(1), unless the authorization is terminated or revoked.  Performed  at [facility], [address], [place], [place] [address]     Labs: Results for orders placed or performed during the hospital encounter of 10/27/20 (from the past 48 hour(s))  MRSA Next Gen by PCR, Nasal     Status: None   Collection Time: 10/27/20 12:39 PM   Specimen: Nasal Mucosa; Nasal Swab  Result Value Ref Range   MRSA by PCR Next Gen NOT DETECTED NOT DETECTED    Comment: (NOTE) The GeneXpert MRSA Assay (FDA approved for NASAL specimens only), is one component of a comprehensive MRSA colonization surveillance program. It is not intended to  diagnose MRSA infection nor to guide or monitor treatment for MRSA infections. Test performance is not FDA approved in patients less than 80 years old. Performed at [facility], [address] [place], [place] [address]   Resp Panel by RT-PCR (Flu A&B, Covid) Nasal Mucosa     Status: None   Collection Time: 10/27/20 12:41 PM   Specimen: Nasal Mucosa; Nasopharyngeal(NP) swabs in vial transport medium  Result Value Ref Range   SARS Coronavirus 2 by RT PCR NEGATIVE NEGATIVE    Comment: (NOTE) SARS-CoV-2 target nucleic acids are NOT DETECTED.  The SARS-CoV-2 RNA is generally detectable in upper respiratory specimens during the acute phase of infection. The lowest concentration of SARS-CoV-2 viral copies this assay can detect is 138 copies/mL. A negative result does not preclude SARS-Cov-2 infection and should not be used as the sole basis for treatment or other patient management decisions. A negative result may occur with  improper specimen collection/handling, submission of specimen other than nasopharyngeal swab, presence of viral mutation(s) within the areas targeted by this assay, and inadequate number of viral copies(<138 copies/mL). A negative result must be combined with clinical observations, patient history, and epidemiological information. The expected result is Negative.  Fact Sheet for Patients:  BloggerCourse.com  Fact Sheet for Healthcare Providers:  SeriousBroker.it  This test is no t yet approved or cleared by the [place] FDA and  has been authorized for detection and/or diagnosis of SARS-CoV-2 by FDA under an Emergency Use Authorization (EUA). This EUA will remain  in effect (meaning this test can be used) for the duration of the COVID-19 declaration under Section 564(b)(1) of the Act, 21 U.S.C.section 360bbb-3(b)(1), unless the authorization is terminated  or revoked sooner.        Influenza A by PCR NEGATIVE NEGATIVE   Influenza B by PCR NEGATIVE NEGATIVE    Comment: (NOTE) The Xpert Xpress SARS-CoV-2/FLU/RSV plus assay is intended as an aid in the diagnosis of influenza from Nasopharyngeal swab specimens and should not be used as a sole basis for treatment. Nasal washings and aspirates are unacceptable for Xpert Xpress SARS-CoV-2/FLU/RSV testing.  Fact Sheet for Patients: BloggerCourse.com  Fact Sheet for Healthcare Providers: SeriousBroker.it  This test is not yet approved or cleared by the [place] FDA and has been authorized for detection and/or diagnosis of SARS-CoV-2 by FDA under an Emergency Use Authorization (EUA). This EUA will remain in effect (meaning this test can be used) for the duration of the COVID-19 declaration under Section 564(b)(1) of the Act, 21 U.S.C. section 360bbb-3(b)(1), unless the authorization is terminated or revoked.  Performed at [facility], [address] [place], [place] [address]   Hemoglobin A1c     Status: Abnormal   Collection Time: 10/27/20  1:03 PM  Result Value Ref Range   Hgb A1c MFr Bld 6.1 (H) 4.8 -  5.6 %    Comment: (NOTE) Pre diabetes:          5.7%-6.4%  Diabetes:              >6.4%  Glycemic control for   <7.0% adults with diabetes    Mean Plasma Glucose 128.37 mg/dL    Comment: Performed at [facility], [address] [place], [place] [address]  CBC with Differential/Platelet     Status: Abnormal   Collection Time: 10/27/20  1:03 PM  Result Value Ref Range   WBC 19.4 (H) 4.0 - 10.5 K/uL   RBC 5.80 4.22 - 5.81 MIL/uL   Hemoglobin 17.1 (H) 13.0 - 17.0 g/dL   HCT 60.4 (H) 54.0 - 98.1 %   MCV 94.3 80.0 - 100.0 fL   MCH 29.5 26.0 - 34.0 pg   MCHC 31.3 30.0 - 36.0 g/dL   RDW 19.1 (H) 47.8 - 29.5 %   Platelets 243 150 - 400 K/uL   nRBC 0.0 0.0 - 0.2 %   Neutrophils Relative % 92 %   Neutro Abs 17.7 (H) 1.7 - 7.7  K/uL   Lymphocytes Relative 5 %   Lymphs Abs 1.0 0.7 - 4.0 K/uL   Monocytes Relative 2 %   Monocytes Absolute 0.4 0.1 - 1.0 K/uL   Eosinophils Relative 0 %   Eosinophils Absolute 0.1 0.0 - 0.5 K/uL   Basophils Relative 0 %   Basophils Absolute 0.1 0.0 - 0.1 K/uL   Immature Granulocytes 1 %   Abs Immature Granulocytes 0.15 (H) 0.00 - 0.07 K/uL    Comment: Performed at [facility], [address] [place], [place] [address]  Brain natriuretic peptide     Status: None   Collection Time: 10/27/20  1:03 PM  Result Value Ref Range   B Natriuretic Peptide 56.3 0.0 - 100.0 pg/mL    Comment: Performed at [facility], [address], [place], [place] [address]  Magnesium     Status: None   Collection Time: 10/27/20  1:03 PM  Result Value Ref Range   Magnesium 2.4 1.7 - 2.4 mg/dL    Comment: Performed at [facility], [address], [place], [place] [address]  Troponin I (High Sensitivity)     Status: Abnormal   Collection Time: 10/27/20  1:03 PM  Result Value Ref Range   Troponin I (High Sensitivity) 250 (HH) <18 ng/L    Comment: CRITICAL RESULT CALLED TO, READ BACK BY AND VERIFIED WITH: COCHRAN,H. RN AT 1401 10/27/20 MULLINS,T (NOTE) Elevated high sensitivity troponin I (hsTnI) values and significant  changes across serial measurements may suggest ACS but many other  chronic and acute conditions are known to elevate hsTnI results.  Refer to the Links section for chest pain algorithms and additional  guidance. Performed at [facility], [address] [place], [place] [address]   Renal function panel     Status: Abnormal   Collection Time: 10/27/20  1:03 PM  Result Value Ref Range   Sodium 137 135 - 145 mmol/L   Potassium 5.4 (H) 3.5 - 5.1 mmol/L    Comment: SLIGHT HEMOLYSIS   Chloride 105 98 - 111 mmol/L   CO2 22 22 - 32 mmol/L   Glucose, Bld 232 (H) 70 - 99 mg/dL    Comment: Glucose reference range  applies only to samples taken after fasting for at least 8 hours.   BUN 22 8 - 23 mg/dL  Creatinine, Ser 1.39 (H) 0.61 - 1.24 mg/dL   Calcium 8.5 (L) 8.9 - 10.3 mg/dL   Phosphorus 6.0 (H) 2.5 - 4.6 mg/dL   Albumin 3.8 3.5 - 5.0 g/dL   GFR, Estimated 55 (L) >60 mL/min    Comment: (NOTE) Calculated using the CKD-EPI Creatinine Equation (2021)    Anion gap 10 5 - 15    Comment: Performed at [facility], [address], [place], [place] [address]  Draw ABG 1 hour after initiation of ventilator     Status: Abnormal   Collection Time: 10/27/20  2:53 PM  Result Value Ref Range   FIO2 80.00    Delivery systems VENTILATOR    Mode PRESSURE REGULATED VOLUME CONTROL    VT 620 mL   LHR 16.0 resp/min   Peep/cpap 8.0 cm H20   pH, Arterial 7.315 (L) 7.350 - 7.450   pCO2 arterial 40.9 32.0 - 48.0 mmHg   pO2, Arterial 97.1 83.0 - 108.0 mmHg   Bicarbonate 20.2 20.0 - 28.0 mmol/L   Acid-base deficit 5.2 (H) 0.0 - 2.0 mmol/L   O2 Saturation 97.2 %   Patient temperature 98.6    Collection site LEFT RADIAL    Drawn by [id number]    Allens test (pass/fail) PASS PASS    Comment: Performed at [facility], [address] [place], [place] [address]  Troponin I (High Sensitivity)     Status: Abnormal   Collection Time: 10/27/20  3:21 PM  Result Value Ref Range   Troponin I (High Sensitivity) 593 (HH) <18 ng/L    Comment: DELTA CHECK NOTED CRITICAL RESULT CALLED TO, READ BACK BY AND VERIFIED WITH: COCHRAN,H. RN AT 1621 10/27/20 MULLINS,T (NOTE) Elevated high sensitivity troponin I (hsTnI) values and significant  changes across serial measurements may suggest ACS but many other  chronic and acute conditions are known to elevate hsTnI results.  Refer to the Links section for chest pain algorithms and additional  guidance. Performed at [facility], [address], [place], [place] [address]   Glucose, capillary     Status: Abnormal    Collection Time: 10/27/20  6:02 PM  Result Value Ref Range   Glucose-Capillary 130 (H) 70 - 99 mg/dL    Comment: Glucose reference range applies only to samples taken after fasting for at least 8 hours.   Comment 1 Notify RN    Comment 2 Document in Chart   Glucose, capillary     Status: Abnormal   Collection Time: 10/27/20  7:36 PM  Result Value Ref Range   Glucose-Capillary 131 (H) 70 - 99 mg/dL    Comment: Glucose reference range applies only to samples taken after fasting for at least 8 hours.  Potassium     Status: None   Collection Time: 10/27/20  9:25 PM  Result Value Ref Range   Potassium 4.4 3.5 - 5.1 mmol/L    Comment: DELTA CHECK NOTED Performed at [facility], [address] [place], [place] [address]   Glucose, capillary     Status: Abnormal   Collection Time: 10/27/20 11:23 PM  Result Value Ref Range   Glucose-Capillary 126 (H) 70 - 99 mg/dL    Comment: Glucose reference range applies only to samples taken after fasting for at least 8 hours.  Basic metabolic panel     Status: Abnormal   Collection Time: 10/28/20  2:36 AM  Result Value Ref Range   Sodium 139 135 - 145 mmol/L  Potassium 4.5 3.5 - 5.1 mmol/L   Chloride 109 98 - 111 mmol/L   CO2 18 (L) 22 - 32 mmol/L   Glucose, Bld 121 (H) 70 - 99 mg/dL    Comment: Glucose reference range applies only to samples taken after fasting for at least 8 hours.   BUN 25 (H) 8 - 23 mg/dL   Creatinine, Ser 9.47 (H) 0.61 - 1.24 mg/dL   Calcium 8.5 (L) 8.9 - 10.3 mg/dL   GFR, Estimated 51 (L) >60 mL/min    Comment: (NOTE) Calculated using the CKD-EPI Creatinine Equation (2021)    Anion gap 12 5 - 15    Comment: Performed at [facility], [address] [place], [place] [address]  CBC     Status: Abnormal   Collection Time: 10/28/20  2:36 AM  Result Value Ref Range   WBC 17.3 (H) 4.0 - 10.5 K/uL   RBC 5.09 4.22 - 5.81 MIL/uL   Hemoglobin 14.9 13.0 - 17.0 g/dL   HCT 36.6 29.4 - 76.5  %   MCV 92.3 80.0 - 100.0 fL   MCH 29.3 26.0 - 34.0 pg   MCHC 31.7 30.0 - 36.0 g/dL   RDW 46.5 (H) 03.5 - 46.5 %   Platelets 220 150 - 400 K/uL   nRBC 0.0 0.0 - 0.2 %    Comment: Performed at [facility], [address], [place], [place] [address]  Glucose, capillary     Status: Abnormal   Collection Time: 10/28/20  3:21 AM  Result Value Ref Range   Glucose-Capillary 139 (H) 70 - 99 mg/dL    Comment: Glucose reference range applies only to samples taken after fasting for at least 8 hours.    Imaging / Studies: DG Chest Port 1 View  Result Date: 10/28/2020 CLINICAL DATA:  Check endotracheal tube placement EXAM: PORTABLE CHEST 1 VIEW COMPARISON:  Film from previous day. FINDINGS: Endotracheal tube is noted in satisfactory position. No cardiomegaly is seen. The lungs are well aerated bilaterally. Improved aeration is noted bilaterally although persistent airspace opacities are seen. IMPRESSION: Improved aeration bilaterally.  No new focal abnormality is noted. Electronically Signed   By: Alcide Clever M.D.   On: 10/28/2020 00:23   DG Chest Port 1 View  Result Date: 10/27/2020 CLINICAL DATA:  Endotracheally intubated. EXAM: PORTABLE CHEST 1 VIEW COMPARISON:  10/27/2020 at 11:19 a.m. FINDINGS: An endotracheal tube remains in place terminating 5.5 cm above the carina. The cardiomediastinal silhouette is within normal limits for portable AP technique. Dense bilateral airspace opacities on the prior study demonstrate partial interval clearing. Remaining symmetric opacities are greatest in the perihilar regions. No sizable pleural effusion or pneumothorax is identified. IMPRESSION: Interval improvement of widespread airspace opacities which could reflect decreasing edema. Electronically Signed   By: Sebastian Ache M.D.   On: 10/27/2020 13:30    Medications / Allergies: per chart  Antibiotics: Anti-infectives (From admission, onward)    Start     Dose/Rate Route Frequency  Ordered Stop   10/28/20 0600  cefTRIAXone (ROCEPHIN) 2 g in sodium chloride 0.9 % 100 mL IVPB       See Hyperspace for full Linked Orders Report.   2 g 200 mL/hr over 30 Minutes Intravenous On call to O.R. 10/27/20 1237 10/29/20 0559   10/28/20 0600  metroNIDAZOLE (FLAGYL) IVPB 500 mg       See Hyperspace for full Linked Orders Report.   500 mg 100 mL/hr over 60 Minutes Intravenous On  call to O.R. 10/27/20 1237 10/29/20 0559         Note: Portions of this report may have been transcribed using voice recognition software. Every effort was made to ensure accuracy; however, inadvertent computerized transcription errors may be present.   Any transcriptional errors that result from this process are unintentional.    Ardeth Sportsman, MD, FACS, MASCRS Esophageal, Gastrointestinal & Colorectal Surgery Robotic and Minimally Invasive Surgery  [facility], [facility]  [facility]  [address], [address] [place], [place] [address] [phone] Fax [phone] Main  CONTACT INFORMATION:  Weekday (9AM-5PM): Call [facility] main office at [phone]  Weeknight (5PM-9AM) or Weekend/Holiday: Check [email or web address] (password " [email or web address]") for General Surgery [facility] coverage  (Please, do not use SecureChat as it is not reliable communication to operating surgeons for immediate patient care)      10/28/2020  7:35 AM

## 2020-10-28 NOTE — Progress Notes (Addendum)
RN received another critical troponin result; 3005. Canary Brim, NP notified by this RN. BP increased to 220's, EKG showed NSR with prolonged QT. Pt c/o non-radiating chest pain. Metoprolol, amlodipine, hydralazine, and nitroglycerin ordered and given. Patient also became more SOB with noticeable flushing. BP beginning to trend down after ordered meds given. This RN will continue to monitor BP and chest pain.

## 2020-10-28 NOTE — Progress Notes (Addendum)
RN entered patient's room and found patient to be very SOB with elevated RR. BP went up to 190's/100's. Pt was profusely sweating. HR remained NSR. Cardiologist at bedside, and CCM notified by this RN. Pt c/o chest pain, which resolved after PRN 0.4 mg nitro tabs x2 doses. Cardiologist ordered STAT echo, CXR, troponin, and nitro gtt. BP improving, SBP now 152. HR 72 bpm. Pt remains SOB, but O2 sat 94% on 6L [place]. This RN will continue to closely monitor pt on nitro gtt.

## 2020-10-28 NOTE — Progress Notes (Signed)
Pt placed on cpap auto 8-20. 6L o2 bled in. Pt wears FFM at home

## 2020-10-28 NOTE — Progress Notes (Signed)
Chaplain offered follow up visit to couple, per Lala Lund request.  Couple received chaplain visit and spoke highly of Chaplain Genesis. The patient's wife acknowledged she was still in shock that routine surgery in another facility had led to him being hospitalized here at [facility].  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  Her father is in hospice care. Chaplain offered support and prayer. Rev. Lynnell Chad Pager [phone]

## 2020-10-28 NOTE — Progress Notes (Signed)
NAME:  ASHLEY MONTMINY, MRN:  [id number], DOB:  1950-02-23, LOS: 1 ADMISSION DATE:  10/27/2020, CONSULTATION DATE:  10/27/2020 REFERRING MD:  Dr. Michaell Cowing, CHIEF COMPLAINT:  Hypoxic resp failure  History of Present Illness:  70 year old male with prior history OSA compliant with CPAP, OSA on CPAP, HTN, CAD with MI 2016 with BMS, DMT2, and HLD presenting [facility] with respiratory failure.   Patient underwent hemorrhoidectomy by Dr. Michaell Cowing [facility] the [facility] which was uncomplicated; he was prone for around 1.5 hrs.  He was extubated in PACU but developed respiratory distress, became hypoxic with pink frothy secretions suspicious for negative pressure pulmonary edema.  He was reintubated by anesthesia with CXR showing pulmonary edema.  Therefore he was transferred to [facility] ICU by [facility] for further care.  PCCM accepting.   Wife reports patient was in his normal state of health prior to surgery.  No reports of fever, URI, chest pain, leg swelling, or shortness of breath.   Pertinent  Medical History  OSA on CPAP, HTN, CAD with MI 2016 with BMS, DMT2, HLD, ED  Significant Hospital Events: Including procedures, antibiotic start and stop dates in addition to other pertinent events   10/19 uncomplicated hemorrhoidectomy at Surgical center > developed pulmonary edema post extubation, requiring reintubation, tx to [facility] ICU  Interim History / Subjective:   Note elevated troponin evening 10/19.  No EKG changes 0.50, PEEP 8 I/O  - 846 total SCr 1.28 >> 1.39 >> 1.48 WBC 17.3 Tolerating PSV currently  Objective   Blood pressure 122/60, pulse (!) 57, temperature 97.8 F (36.6 C), temperature source Axillary, resp. rate 16, height 6' (1.829 m), weight 108.1 kg, SpO2 98 %.    Vent Mode: PRVC FiO2 (%):  [40 %-100 %] 50 % Set Rate:  [16 bmp] 16 bmp Vt Set:  [620 mL] 620 mL PEEP:  [8 cmH20] 8 cmH20 Plateau Pressure:  [13 cmH20-19 cmH20] 13 cmH20   Intake/Output Summary (Last 24 hours) at  10/28/2020 0736 Last data filed at 10/28/2020 0700 Gross per 24 hour  Intake 379.02 ml  Output 1225 ml  Net -845.98 ml   Filed Weights   10/27/20 1225 10/27/20 1300 10/28/20 0500  Weight: 84.8 kg 107.2 kg 108.1 kg   Examination:   General: Comfortable on MV HEENT: ET tube in place, some pink secretions from the ET tube, pupils equal Neuro: Awake, alert, interacting and following commands.  Good cough strength.  Still on low-dose Precedex CV: Regular, no murmur PULM: Few soft inspiratory crackles, improved GI: Obese, nondistended with positive bowel sounds Extremities: No edema Skin: No rash  Resolved Hospital Problem list     Assessment & Plan:   Acute hypoxic respiratory failure secondary to pulmonary edema, felt more negative pressure but r/o cardiac component -Continue current vent support, PRVC 8 cc/kg.  Goal push for extubation today 10/20 -PAD protocol with Precedex, fentanyl as needed -Dose diuretics daily, depending on chest x-ray, hemodynamics and renal function -Elevated troponin, as below   Hx HTN, CAD w/ previous MI/ stent, HLD -Grade 1 diastolic dysfunction echocardiogram 6/22 -Home aspirin, lisinopril, metoprolol and Lasix are on hold -Dosing diuretics daily -Repeat high-sensitivity troponin this morning   Prolapsed Internal Hemorrhoids s/p elective uncomplicated hemorrhoidectomy -Postop management as per [facility] plans -Continue hydrocortisone- pramoxine as needed - foley to be left placed overnight for possible urinary retention; plan to remove today 10/20   DM -Holding home metformin until taking enteral medications -Follow CBG, add sliding scale  insulin if consistently greater than 180 (has been at goal)   OSA on CPAP -Plan to resume CPAP postextubation   Best Practice (right click and "Reselect all SmartList Selections" daily)   Diet/type: NPO DVT prophylaxis: prophylactic heparin  GI prophylaxis: PPI Lines: N/A Foley:  Yes, and it is still  needed Code Status:  full code Last date of multidisciplinary goals of care discussion [10/19 with wife Cala Bradford [phone]]   Labs   CBC: Recent Labs  Lab 10/27/20 1303 10/28/20 0236  WBC 19.4* 17.3*  NEUTROABS 17.7*  --   HGB 17.1* 14.9  HCT 54.7* 47.0  MCV 94.3 92.3  PLT 243 220    Basic Metabolic Panel: Recent Labs  Lab 10/27/20 1303 10/27/20 2125 10/28/20 0236  NA 137  --  139  K 5.4* 4.4 4.5  CL 105  --  109  CO2 22  --  18*  GLUCOSE 232*  --  121*  BUN 22  --  25*  CREATININE 1.39*  --  1.48*  CALCIUM 8.5*  --  8.5*  MG 2.4  --   --   PHOS 6.0*  --   --    GFR: Estimated Creatinine Clearance: 59.8 mL/min (A) (by C-G formula based on SCr of 1.48 mg/dL (H)). Recent Labs  Lab 10/27/20 1303 10/28/20 0236  WBC 19.4* 17.3*    Liver Function Tests: Recent Labs  Lab 10/27/20 1303  ALBUMIN 3.8   No results for input(s): LIPASE, AMYLASE in the last 168 hours. No results for input(s): AMMONIA in the last 168 hours.  ABG    Component Value Date/Time   PHART 7.315 (L) 10/27/2020 1453   PCO2ART 40.9 10/27/2020 1453   PO2ART 97.1 10/27/2020 1453   HCO3 20.2 10/27/2020 1453   ACIDBASEDEF 5.2 (H) 10/27/2020 1453   O2SAT 97.2 10/27/2020 1453     Coagulation Profile: No results for input(s): INR, PROTIME in the last 168 hours.  Cardiac Enzymes: No results for input(s): CKTOTAL, CKMB, CKMBINDEX, TROPONINI in the last 168 hours.  HbA1C: Hgb A1c MFr Bld  Date/Time Value Ref Range Status  10/27/2020 01:03 PM 6.1 (H) 4.8 - 5.6 % Final    Comment:    (NOTE) Pre diabetes:          5.7%-6.4%  Diabetes:              >6.4%  Glycemic control for   <7.0% adults with diabetes   04/11/2016 09:35 AM 6.1 (H) 4.8 - 5.6 % Final    Comment:             Pre-diabetes: 5.7 - 6.4          Diabetes: >6.4          Glycemic control for adults with diabetes: <7.0     CBG: Recent Labs  Lab 10/27/20 1802 10/27/20 1936 10/27/20 2323 10/28/20 0321  GLUCAP  130* 131* 126* 139*     Critical care time: 34 mins     Levy Pupa, MD, PhD 10/28/2020, 7:37 AM [facility] [phone] or if no answer before 7:00PM call [phone] For any issues after 7:00PM please call eLink [phone]

## 2020-10-28 NOTE — Consult Note (Addendum)
Cardiology Consultation:   Patient ID: [name] [name] MRN: [id number]; DOB: 03/21/1950  Admit date: 10/27/2020 Date of Consult: 10/28/2020  PCP:  Benita Stabile, MD   [facility] Providers Cardiologist:  Rollene Rotunda, MD      Patient Profile:   [name] [name] is a 70 y.o. male with a hx of HTN, HLD, DM II, OSA on CPAP, and CAD s/p MI 2008 with BMS to RCA who is being seen 10/28/2020 for the evaluation of chest pain with uptrending troponin at the request of Dr. Delton Coombes.  History of Present Illness:   [name] is a 70 yo male with PMH of HTN, HLD, DM II, OSA on CPAP, and CAD s/p MI 2008 with BMS to RCA.  Myoview obtained on 01/16/2017 was abnormal showing medium defect of moderate severity in the basal inferior, mid inferior and apical inferior location consistent with ischemia.  He subsequently underwent repeat cardiac catheterization performed on 02/14/2017 showed 10% proximal RCA lesion, 5% proximal to mid RCA lesion, 10% distal RCA lesion, 20% proximal to mid LAD lesion, 20% proximal LAD lesion.  Medical therapy was recommended.  Patient was last seen by Marjie Skiff, PA-C in April 2022 for preoperative clearance prior to colonoscopy procedure.  A repeat echocardiogram was performed at that time which showed EF 65 to 70%, no regional wall motion abnormality, grade 1 DD, trivial MR.   Patient underwent elective hemorrhoidectomy on 10/19, he was able to be extubated in the PACU however developed respiratory distress in the setting of bilateral pulmonary infiltrate, pink frothy sputum.  Pulmonology service is concerned about patient might have negative pressure pulmonary edema post extubation.  Patient was reintubated overnight and was eventually extubated around 9:16 AM In the morning of 10/28/2020.  He received a single dose of IV Lasix in the night on 10/19.  After extubation, patient continued to have profuse amount of secretion requiring frequent suctioning.  His systolic blood pressure  also went up to 200 range.  In the afternoon of 10/20, he started complaining of chest discomfort that lasted roughly 30 minutes.  At the same time, he was severely hypertensive with systolic blood pressure of 200.  He was given a IV Lopressor and sublingual nitroglycerin with some improvement. Immediately after he was intubated yesterday, he had mildly elevated high-sensitivity troponin at 250 --> 593.  However this afternoon,His repeat high-sensitivity troponin was 1680 -->3005.  EKG shows no obvious changes.  Cardiology service consulted for postop NSTEMI.  During examination of the patient, he was sitting up breathing heavily using accessory muscles.  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   Past Medical History:  Diagnosis Date   Coronary artery disease    S/P stent Summer 2008, January 2016 left main 30% stenosis, LAD mid 50% stenosis, patent stent in the mid right coronary artery with ectasia proximal and distal to the stent. EF mildly reduced at 40% with global hypokinesis.   Diabetes mellitus (HCC)    A1c 6.1 (11-2008)   Erectile dysfunction    Hyperlipidemia    Hypertension    MI (mitral incompetence)    Myocardial infarction (HCC) 2016   Sleep apnea    URI (upper respiratory infection) 08/18/2011    Past Surgical History:  Procedure Laterality Date   COLONOSCOPY  2012   negative   COLONOSCOPY WITH PROPOFOL N/A 06/09/2020   Procedure: COLONOSCOPY WITH PROPOFOL;  Surgeon: Dolores Frame, MD;  Location: [facility];  Service: Gastroenterology;  Laterality:  N/A;  10:05   LEFT HEART CATH AND CORONARY ANGIOGRAPHY N/A 02/14/2017   Procedure: LEFT HEART CATH AND CORONARY ANGIOGRAPHY;  Surgeon: Lennette Bihari, MD;  Location: [facility] INVASIVE CV LAB;  Service: Cardiovascular;  Laterality: N/A;   WRIST SURGERY     fractured wrist, Dr. Amanda Pea     Home Medications:  Prior to Admission medications   Medication Sig Start Date End Date Taking? Authorizing Provider   aspirin EC 81 MG tablet Take 81 mg by mouth in the morning. Swallow whole.   Yes [provider]  atorvastatin (LIPITOR) 40 MG tablet TAKE 1 TABLET BY MOUTH ONCE DAILY Patient taking differently: Take 40 mg by mouth every evening. 01/01/17  Yes Rollene Rotunda, MD  furosemide (LASIX) 20 MG tablet Take 20 mg by mouth in the morning. 06/30/17  Yes [provider]  lisinopril (PRINIVIL,ZESTRIL) 5 MG tablet TAKE 1 TABLET BY MOUTH ONCE DAILY Patient taking differently: Take 5 mg by mouth in the morning. 04/16/17  Yes Rollene Rotunda, MD  metFORMIN (GLUCOPHAGE) 500 MG tablet TAKE 1 TABLET BY MOUTH ONCE DAILY Patient taking differently: Take 500 mg by mouth every evening. 04/16/17  Yes Rollene Rotunda, MD  metoprolol succinate (TOPROL-XL) 100 MG 24 hr tablet TAKE 1 TABLET BY MOUTH ONCE DAILY WITH  OR  IMMEDIATELY  FOLLOWING  A  MEAL Patient taking differently: Take 100 mg by mouth in the morning. 04/16/17  Yes Rollene Rotunda, MD  nitroGLYCERIN (NITROSTAT) 0.4 MG SL tablet Place 1 tablet (0.4 mg total) under the tongue every 5 (five) minutes as needed. 04/20/20  Yes Marjie Skiff E, PA-C  potassium chloride (K-DUR) 10 MEQ tablet Take 10 mEq by mouth in the morning. 05/08/17  Yes [provider]    Inpatient Medications: Scheduled Meds:  acetaminophen  1,000 mg Oral Q6H   amLODipine  5 mg Oral Daily   chlorhexidine       Chlorhexidine Gluconate Cloth  6 each Topical Daily   heparin  5,000 Units Subcutaneous Q8H   mouth rinse  15 mL Mouth Rinse BID   metoprolol tartrate  25 mg Oral BID   nitroGLYCERIN       pantoprazole (PROTONIX) IV  40 mg Intravenous Daily   polycarbophil  625 mg Oral BID   Continuous Infusions:  sodium chloride Stopped (10/28/20 1016)   cefTRIAXone (ROCEPHIN)  IV     And   metronidazole     nitroGLYCERIN 5 mcg/min (10/28/20 1556)   PRN Meds: sodium chloride, albuterol, fentaNYL (SUBLIMAZE) injection, fentaNYL (SUBLIMAZE) injection,  hydrocortisone-pramoxine, metoprolol tartrate, oxyCODONE  Allergies:   No Known Allergies  Social History:   Social History   Socioeconomic History   Marital status: Married    Spouse name: Not on file   Number of children: 2   Years of education: Not on file   Highest education level: Not on file  Occupational History   Occupation: Event organiser: [employer]  Tobacco Use   Smoking status: Former    Types: Cigarettes    Quit date: 01/10/2003    Years since quitting: 17.8   Smokeless tobacco: Never  Vaping Use   Vaping Use: Never used  Substance and Sexual Activity   Alcohol use: Yes    Comment: rarely   Drug use: Yes    Types: Marijuana   Sexual activity: Not on file  Other Topics Concern   Not on file  Social History Narrative   06/14/2009 designated party form signed  appointing wife Knut Rondinelli; Ok to leave message on home answering machine at [phone]   Social Determinants of Health   Financial Resource Strain: Not on file  Food Insecurity: Not on file  Transportation Needs: Not on file  Physical Activity: Not on file  Stress: Not on file  Social Connections: Not on file  Intimate Partner Violence: Not on file    Family History:    Family History  Problem Relation Age of Onset   Pernicious anemia Maternal Grandmother    Coronary artery disease Father 37   Colon cancer Father        ?   Stroke Mother 31   Hypertension Brother    Stroke Maternal Grandfather    CAD Brother 38   CAD Brother 50     ROS:  Please see the history of present illness.   All other ROS reviewed and negative.     Physical Exam/Data:   Vitals:   10/28/20 1600 10/28/20 1605 10/28/20 1610 10/28/20 1614  BP: (!) 158/78 (!) 154/79 (!) 151/68   Pulse: 74 71 63 65  Resp: 13 12 15 14   Temp:      TempSrc:      SpO2: 94% 99% 100% 100%  Weight:      Height:        Intake/Output Summary (Last 24 hours) at 10/28/2020 1626 Last data filed at 10/28/2020  1200 Gross per 24 hour  Intake 441.75 ml  Output 1150 ml  Net -708.25 ml   Last 3 Weights 10/28/2020 10/27/2020 10/27/2020  Weight (lbs) 238 lb 5.1 oz 236 lb 5.3 oz 187 lb  Weight (kg) 108.1 kg 107.2 kg 84.823 kg     Body mass index is 32.32 kg/m.  General:  Well nourished, well developed. Appears to be in respiratory distress. HEENT: normal Neck: no JVD Vascular: No carotid bruits; Distal pulses 2+ bilaterally Cardiac:  normal S1, S2; RRR; no murmur  Lungs:  Bilateral rhonchi, worse on the R side when compare to the L side.  Abd: soft, nontender, no hepatomegaly  Ext: no edema Musculoskeletal:  No deformities, BUE and BLE strength normal and equal Skin: warm and dry  Neuro:  CNs 2-12 intact, no focal abnormalities noted Psych:  Normal affect   EKG:  The EKG was personally reviewed and demonstrates:  NSR without significant ST-T wave changes Telemetry:  Telemetry was personally reviewed and demonstrates:  NSR without significant ventricular ectopy  Relevant CV Studies:  Cath 02/14/2017 Prox RCA lesion is 10% stenosed. Prox RCA to Mid RCA lesion is 5% stenosed. Dist RCA lesion is 10% stenosed. Prox LAD to Mid LAD lesion is 40% stenosed. Prox LAD lesion is 20% stenosed. The left ventricular systolic function is normal. LV end diastolic pressure is normal.   Normal LV function without focal segmental wall motion abnormalities.   Mild nonobstructive CAD with 20% proximal LAD stenosis before the takeoff of the first diagonal vessel with smooth 30 -40% narrowing in the LAD beyond this diagonal vessel; normal left circumflex coronary artery; and large dominant RCA with a widely patent mid RCA stent and minimal luminal irregularity of 10% proximally and distally.   RECOMMENDATION: Medical therapy.  The patient will follow-up with Dr. 04/14/2017.    Laboratory Data:  High Sensitivity Troponin:   Recent Labs  Lab 10/27/20 1303 10/27/20 1521 10/28/20 1025 10/28/20 1302   TROPONINIHS 250* 593* 1,685* 3,005*     Chemistry Recent Labs  Lab 10/27/20 1303 10/27/20 2125 10/28/20 0236  NA 137  --  139  K 5.4* 4.4 4.5  CL 105  --  109  CO2 22  --  18*  GLUCOSE 232*  --  121*  BUN 22  --  25*  CREATININE 1.39*  --  1.48*  CALCIUM 8.5*  --  8.5*  MG 2.4  --   --   GFRNONAA 55*  --  51*  ANIONGAP 10  --  12    Recent Labs  Lab 10/27/20 1303  ALBUMIN 3.8   Lipids No results for input(s): CHOL, TRIG, HDL, LABVLDL, LDLCALC, CHOLHDL in the last 168 hours.  Hematology Recent Labs  Lab 10/27/20 1303 10/28/20 0236  WBC 19.4* 17.3*  RBC 5.80 5.09  HGB 17.1* 14.9  HCT 54.7* 47.0  MCV 94.3 92.3  MCH 29.5 29.3  MCHC 31.3 31.7  RDW 15.8* 15.8*  PLT 243 220   Thyroid No results for input(s): TSH, FREET4 in the last 168 hours.  BNP Recent Labs  Lab 10/27/20 1303  BNP 56.3    DDimer No results for input(s): DDIMER in the last 168 hours.   Radiology/Studies:  DG CHEST PORT 1 VIEW  Result Date: 10/28/2020 CLINICAL DATA:  Chest pain. EXAM: PORTABLE CHEST 1 VIEW COMPARISON:  Same day. FINDINGS: The heart size and mediastinal contours are within normal limits. Both lungs are clear. The visualized skeletal structures are unremarkable. IMPRESSION: No active disease. Electronically Signed   By: Lupita Raider M.D.   On: 10/28/2020 16:11   DG Chest Port 1 View  Result Date: 10/28/2020 CLINICAL DATA:  Check endotracheal tube placement EXAM: PORTABLE CHEST 1 VIEW COMPARISON:  Film from previous day. FINDINGS: Endotracheal tube is noted in satisfactory position. No cardiomegaly is seen. The lungs are well aerated bilaterally. Improved aeration is noted bilaterally although persistent airspace opacities are seen. IMPRESSION: Improved aeration bilaterally.  No new focal abnormality is noted. Electronically Signed   By: Alcide Clever M.D.   On: 10/28/2020 00:23   DG Chest Port 1 View  Result Date: 10/27/2020 CLINICAL DATA:  Endotracheally intubated. EXAM:  PORTABLE CHEST 1 VIEW COMPARISON:  10/27/2020 at 11:19 a.m. FINDINGS: An endotracheal tube remains in place terminating 5.5 cm above the carina. The cardiomediastinal silhouette is within normal limits for portable AP technique. Dense bilateral airspace opacities on the prior study demonstrate partial interval clearing. Remaining symmetric opacities are greatest in the perihilar regions. No sizable pleural effusion or pneumothorax is identified. IMPRESSION: Interval improvement of widespread airspace opacities which could reflect decreasing edema. Electronically Signed   By: Sebastian Ache M.D.   On: 10/27/2020 13:30     Assessment and Plan:   Chest pain with elevated troponin  - serial troponin 250 --> 593 --> 1685 --> 3005. With associated diaphoresis and facial flushing  - symptom concerning for postoperative MI, however complicating feature include acute respiratory distress requiring intubation and hypertensive urgency.   - unclear if elevation of troponin is ACS vs combination of respiratory distress and hypertensive urgency  - obtain limited echocardiogram today to look at wall motion and EF. Last echo in June 2022 showed normal EF.  - discussed with MD, either way may require cath tomorrow, will make NPO past midnight, not sure patient can lay flat today. EKG reviewed with MD, no obvious ST-T wave changes. Continue to trend troponin  Acute respiratory failure requiring intubation - reintubated in the PACU on 10/19 after hemorrhoidectomy, extubated around 9:16 AM this morning  - on initial arrival, patient  appears to be struggling to breath. He was just extubated this morning.   - continue to have copious amount of oral secretion that requires frequent suctioning and bilateral rhonchi (R > L) on exam. Obtained CXR to see if need more diuretic, however exam shows no rales, mainly rhonchi. Followed by PCCM  Addendum: no sign of acute CHF by CXR just now. Will hold off on  diuretic  CAD  -Echocardiogram obtained on 06/14/2020 showed EF 65 to 70%, trivial MR, grade 1 DD.  Hypertension: severely hypertensive this afternoon. Started on metorpolol tartrate and amlodipine. Home lisinopril held. Given IV lopressor and hydralazine.    Hyperlipidemia  DM2  Obstructive sleep apnea   Risk Assessment/Risk Scores:     TIMI Risk Score for Unstable Angina or Non-ST Elevation MI:   The patient's TIMI risk score is 6, which indicates a 41% risk of all cause mortality, new or recurrent myocardial infarction or need for urgent revascularization in the next 14 days.  New York Heart Association (NYHA) Functional Class NYHA Class IV        For questions or updates, please contact [facility] Please consult [email or web address] for contact info under    Ramond Dial, [place]  10/28/2020 4:26 PM  Attending Note:   The patient was seen and examined.  Agree with assessment and plan as noted above.  Changes made to the above note as needed.  Patient seen and independently examined with Azalee Course, PA .   We discussed all aspects of the encounter. I agree with the assessment and plan as stated above.     NSTEMI - following hemorrhoid surgery .   Complicated by acute respiratory distress.   He is better after some lasix and nebs.   Has a  hx of stenting.    He needs a cardiac cath .  Will need to make sure his lungs are ok for cath   We have discussed the risks , benefits, optons.  He understands and agrees to proceed.   2.  Respiratory distress CXR looks clear Has lots of wheezing  Will give him nebs scheduled through the night     I have spent a total of 40 minutes with patient reviewing hospital  notes , telemetry, EKGs, labs and examining patient as well as establishing an assessment and plan that was discussed with the patient.  > 50% of time was spent in direct patient care.    Vesta Mixer, Montez Hageman., MD, [facility] 10/28/2020, 5:07 PM [address],   [address] Office [phone] Pager [phone]

## 2020-10-28 NOTE — Progress Notes (Signed)
Chest pain improved with nitroglycerin.  Additional BP agents added for blood pressure control.    Canary Brim, MSN, APRN, NP-C, AGACNP-BC [facility] 10/28/2020, 2:42 PM   Please see [email or web address] for pager details.   From 7A-7P if no response, please call [phone] After hours, please call ELink [phone]

## 2020-10-28 NOTE — Progress Notes (Signed)
Patient was extubated by RT at 0916 per orders. Pt tolerated procedure well, with no complications. Pt was placed on 4L [place], O2 sat 94%. Pt denies SOB at this time. This RN will continue to carefully monitor patient for changes in status.

## 2020-10-28 NOTE — Procedures (Signed)
Extubation Procedure Note  Patient Details:   Name: [name] [name] DOB: 1950/03/30 MRN: [id number]   Airway Documentation:    Vent end date: 10/28/20 Vent end time: 0916   Evaluation  O2 sats: stable throughout Complications: No apparent complications Patient did tolerate procedure well. Bilateral Breath Sounds: Clear, Diminished   Yes Pt is able to speak Pt on 4l [place]  Renold Genta 10/28/2020, 9:22 AM

## 2020-10-29 ENCOUNTER — Inpatient Hospital Stay (HOSPITAL_COMMUNITY)
Admission: AD | Disposition: A | Discharge status: Home / Self Care | Payer: Self-pay | Source: Ambulatory Visit | Attending: Emergency Medicine

## 2020-10-29 ENCOUNTER — Inpatient Hospital Stay (HOSPITAL_COMMUNITY): Payer: Medicare Other

## 2020-10-29 DIAGNOSIS — I25119 Atherosclerotic heart disease of native coronary artery with unspecified angina pectoris: Secondary | ICD-10-CM | POA: Diagnosis not present

## 2020-10-29 DIAGNOSIS — J9690 Respiratory failure, unspecified, unspecified whether with hypoxia or hypercapnia: Secondary | ICD-10-CM | POA: Diagnosis not present

## 2020-10-29 DIAGNOSIS — I214 Non-ST elevation (NSTEMI) myocardial infarction: Secondary | ICD-10-CM

## 2020-10-29 DIAGNOSIS — R079 Chest pain, unspecified: Secondary | ICD-10-CM

## 2020-10-29 HISTORY — PX: LEFT HEART CATH AND CORONARY ANGIOGRAPHY: CATH118249

## 2020-10-29 HISTORY — PX: RIGHT HEART CATH: CATH118263

## 2020-10-29 LAB — POCT I-STAT 7, (LYTES, BLD GAS, ICA,H+H)
Acid-base deficit: 2 mmol/L (ref 0.0–2.0)
Acid-base deficit: 2 mmol/L (ref 0.0–2.0)
Bicarbonate: 21.7 mmol/L (ref 20.0–28.0)
Bicarbonate: 21.9 mmol/L (ref 20.0–28.0)
Calcium, Ion: 1.13 mmol/L — ABNORMAL LOW (ref 1.15–1.40)
Calcium, Ion: 1.14 mmol/L — ABNORMAL LOW (ref 1.15–1.40)
HCT: 38 % — ABNORMAL LOW (ref 39.0–52.0)
HCT: 38 % — ABNORMAL LOW (ref 39.0–52.0)
Hemoglobin: 12.9 g/dL — ABNORMAL LOW (ref 13.0–17.0)
Hemoglobin: 12.9 g/dL — ABNORMAL LOW (ref 13.0–17.0)
O2 Saturation: 58 %
O2 Saturation: 61 %
Potassium: 3.5 mmol/L (ref 3.5–5.1)
Potassium: 3.6 mmol/L (ref 3.5–5.1)
Sodium: 141 mmol/L (ref 135–145)
Sodium: 141 mmol/L (ref 135–145)
TCO2: 23 mmol/L (ref 22–32)
TCO2: 23 mmol/L (ref 22–32)
pCO2 arterial: 32.4 mmHg (ref 32.0–48.0)
pCO2 arterial: 33 mmHg (ref 32.0–48.0)
pH, Arterial: 7.43 (ref 7.350–7.450)
pH, Arterial: 7.434 (ref 7.350–7.450)
pO2, Arterial: 29 mmHg — CL (ref 83.0–108.0)
pO2, Arterial: 30 mmHg — CL (ref 83.0–108.0)

## 2020-10-29 LAB — CBC
HCT: 43.4 % (ref 39.0–52.0)
Hemoglobin: 13.8 g/dL (ref 13.0–17.0)
MCH: 29.3 pg (ref 26.0–34.0)
MCHC: 31.8 g/dL (ref 30.0–36.0)
MCV: 92.1 fL (ref 80.0–100.0)
Platelets: 170 10*3/uL (ref 150–400)
RBC: 4.71 MIL/uL (ref 4.22–5.81)
RDW: 15.9 % — ABNORMAL HIGH (ref 11.5–15.5)
WBC: 15.1 10*3/uL — ABNORMAL HIGH (ref 4.0–10.5)
nRBC: 0 % (ref 0.0–0.2)

## 2020-10-29 LAB — BASIC METABOLIC PANEL
Anion gap: 8 (ref 5–15)
BUN: 26 mg/dL — ABNORMAL HIGH (ref 8–23)
CO2: 23 mmol/L (ref 22–32)
Calcium: 8.2 mg/dL — ABNORMAL LOW (ref 8.9–10.3)
Chloride: 107 mmol/L (ref 98–111)
Creatinine, Ser: 1.35 mg/dL — ABNORMAL HIGH (ref 0.61–1.24)
GFR, Estimated: 57 mL/min — ABNORMAL LOW (ref >60)
Glucose, Bld: 111 mg/dL — ABNORMAL HIGH (ref 70–99)
Potassium: 4.1 mmol/L (ref 3.5–5.1)
Sodium: 138 mmol/L (ref 135–145)

## 2020-10-29 LAB — GLUCOSE, CAPILLARY
Glucose-Capillary: 108 mg/dL — ABNORMAL HIGH (ref 70–99)
Glucose-Capillary: 102 mg/dL — ABNORMAL HIGH (ref 70–99)
Glucose-Capillary: 112 mg/dL — ABNORMAL HIGH (ref 70–99)
Glucose-Capillary: 108 mg/dL — ABNORMAL HIGH (ref 70–99)
Glucose-Capillary: 113 mg/dL — ABNORMAL HIGH (ref 70–99)

## 2020-10-29 LAB — ECHOCARDIOGRAM LIMITED
Area-P 1/2: 4.21 cm2
Calc EF: 50.4 %
Height: 72 in
S' Lateral: 4.4 cm
Single Plane A2C EF: 48.3 %
Single Plane A4C EF: 51.8 %
Weight: 3749.58 oz

## 2020-10-29 LAB — MAGNESIUM
Magnesium: 2 mg/dL (ref 1.7–2.4)
Magnesium: 1.9 mg/dL (ref 1.7–2.4)

## 2020-10-29 SURGERY — LEFT HEART CATH AND CORONARY ANGIOGRAPHY
Anesthesia: LOCAL

## 2020-10-29 MED ORDER — SODIUM CHLORIDE 0.9% FLUSH: 3.0000 mL | INTRAVENOUS | Status: DC | PRN

## 2020-10-29 MED ORDER — VERAPAMIL HCL 2.5 MG/ML IV SOLN
INTRAVENOUS | Status: AC
  Filled 2020-10-29: qty 2

## 2020-10-29 MED ORDER — ONDANSETRON HCL 4 MG/2ML IJ SOLN: 4.0000 mg | Freq: Four times a day (QID) | INTRAMUSCULAR | Status: DC | PRN

## 2020-10-29 MED ORDER — IOHEXOL 350 MG/ML SOLN
INTRAVENOUS | Status: DC | PRN
  Administered 2020-10-29: 75 mL

## 2020-10-29 MED ORDER — ACETAMINOPHEN 325 MG PO TABS: 650.0000 mg | ORAL_TABLET | ORAL | Status: DC | PRN

## 2020-10-29 MED ORDER — SODIUM CHLORIDE 0.9% FLUSH
3.0000 mL | Freq: Two times a day (BID) | INTRAVENOUS | Status: DC
  Administered 2020-10-29 – 2020-10-31 (×4): 3 mL via INTRAVENOUS

## 2020-10-29 MED ORDER — SODIUM CHLORIDE 0.9 % IV SOLN: INTRAVENOUS | Status: DC

## 2020-10-29 MED ORDER — VERAPAMIL HCL 2.5 MG/ML IV SOLN
INTRAVENOUS | Status: DC | PRN
  Administered 2020-10-29: 10 mL via INTRA_ARTERIAL

## 2020-10-29 MED ORDER — NITROGLYCERIN IN D5W 200-5 MCG/ML-% IV SOLN
INTRAVENOUS | Status: AC
  Filled 2020-10-29: qty 250

## 2020-10-29 MED ORDER — HEPARIN SODIUM (PORCINE) 5000 UNIT/ML IJ SOLN
5000.0000 [IU] | Freq: Three times a day (TID) | INTRAMUSCULAR | Status: DC
  Administered 2020-10-29 – 2020-10-31 (×5): 5000 [IU] via SUBCUTANEOUS
  Filled 2020-10-29 (×5): qty 1

## 2020-10-29 MED ORDER — ASPIRIN 81 MG PO CHEW
81.0000 mg | CHEWABLE_TABLET | ORAL | Status: AC
  Administered 2020-10-29: 81 mg via ORAL
  Filled 2020-10-29: qty 1

## 2020-10-29 MED ORDER — HEPARIN SODIUM (PORCINE) 1000 UNIT/ML IJ SOLN
INTRAMUSCULAR | Status: AC
  Filled 2020-10-29: qty 1

## 2020-10-29 MED ORDER — OXYCODONE HCL 5 MG PO TABS: 5.0000 mg | ORAL_TABLET | ORAL | Status: DC | PRN

## 2020-10-29 MED ORDER — ASPIRIN 81 MG PO CHEW: 81.0000 mg | CHEWABLE_TABLET | ORAL | Status: DC

## 2020-10-29 MED ORDER — SODIUM CHLORIDE 0.9 % IV SOLN: 250.0000 mL | INTRAVENOUS | Status: DC | PRN

## 2020-10-29 MED ORDER — SODIUM CHLORIDE 0.9 % IV SOLN: INTRAVENOUS | Status: AC

## 2020-10-29 MED ORDER — OXYCODONE HCL 5 MG PO TABS
ORAL_TABLET | ORAL | Status: AC
  Filled 2020-10-29: qty 2

## 2020-10-29 MED ORDER — LIDOCAINE IN D5W 4-5 MG/ML-% IV SOLN
INTRAVENOUS | Status: AC
  Filled 2020-10-29: qty 500

## 2020-10-29 MED ORDER — LIDOCAINE HCL (PF) 1 % IJ SOLN
INTRAMUSCULAR | Status: AC
  Filled 2020-10-29: qty 30

## 2020-10-29 MED ORDER — LABETALOL HCL 5 MG/ML IV SOLN: 10.0000 mg | INTRAVENOUS | Status: AC | PRN

## 2020-10-29 MED ORDER — HEPARIN (PORCINE) IN NACL 1000-0.9 UT/500ML-% IV SOLN
INTRAVENOUS | Status: AC
  Filled 2020-10-29: qty 1000

## 2020-10-29 MED ORDER — LIDOCAINE HCL (PF) 1 % IJ SOLN
INTRAMUSCULAR | Status: DC | PRN
  Administered 2020-10-29: 2 mL

## 2020-10-29 MED ORDER — HEPARIN (PORCINE) IN NACL 1000-0.9 UT/500ML-% IV SOLN
INTRAVENOUS | Status: DC | PRN
  Administered 2020-10-29 (×2): 500 mL

## 2020-10-29 MED ORDER — HYDRALAZINE HCL 20 MG/ML IJ SOLN: 10.0000 mg | INTRAMUSCULAR | Status: AC | PRN

## 2020-10-29 MED ORDER — FENTANYL CITRATE PF 50 MCG/ML IJ SOSY
25.0000 ug | PREFILLED_SYRINGE | INTRAMUSCULAR | Status: DC | PRN
Start: 2020-10-29 — End: 2020-10-31

## 2020-10-29 MED ORDER — HEPARIN SODIUM (PORCINE) 1000 UNIT/ML IJ SOLN
INTRAMUSCULAR | Status: DC | PRN
  Administered 2020-10-29: 5000 [IU] via INTRAVENOUS

## 2020-10-29 SURGICAL SUPPLY — 12 items
CATH 5FR JL3.5 JR4 ANG PIG MP (CATHETERS) ×1 IMPLANT
CATH BALLN WEDGE 5F 110CM (CATHETERS) ×1 IMPLANT
DEVICE RAD COMP TR BAND LRG (VASCULAR PRODUCTS) ×1 IMPLANT
GLIDESHEATH SLEND A-KIT 6F 22G (SHEATH) ×1 IMPLANT
GUIDEWIRE INQWIRE 1.5J.035X260 (WIRE) IMPLANT
INQWIRE 1.5J .035X260CM (WIRE) ×2
KIT HEART LEFT (KITS) ×2 IMPLANT
PACK CARDIAC CATHETERIZATION (CUSTOM PROCEDURE TRAY) ×2 IMPLANT
SHEATH GLIDE SLENDER 4/5FR (SHEATH) ×1 IMPLANT
SHEATH PROBE COVER 6X72 (BAG) ×1 IMPLANT
TRANSDUCER W/STOPCOCK (MISCELLANEOUS) ×2 IMPLANT
TUBING CIL FLEX 10 FLL-RA (TUBING) ×2 IMPLANT

## 2020-10-29 NOTE — Progress Notes (Signed)
PT Cancellation Note  Patient Details Name: [name] [name] MRN: [id number] DOB: [birth year]   Cancelled Treatment:    Reason Eval/Treat Not Completed: Medical issues which prohibited therapy Pt with Acute coronary syndrome after surgery.  Plan for cardiac cath today.  Will hold therapy today and check back as schedule permits.   Janan Halter [place] 10/29/2020, 9:09 AM Paulino Door, DPT Acute Rehabilitation Services Pager: [phone] Office: [phone]

## 2020-10-29 NOTE — Progress Notes (Signed)
OT Cancellation Note  Patient Details Name: [name] [name] MRN: [id number] DOB: 31-May-1950   Cancelled Treatment:    Reason Eval/Treat Not Completed: Medical issues which prohibited therapy. Patient found to have NSTEMI after surgery and due to have heart cath today. Will hold evaluation for procedure and follow up as able.  Augusta Hilbert L Boomer Winders 10/29/2020, 8:06 AM

## 2020-10-29 NOTE — Progress Notes (Addendum)
    Got message from Dr. Verdis Prime at [facility] cardiology.  Post cardiac cath this wall motion abnormalities.  They prefer the patient be at [facility] instead of [facility].  They transfer the patient to [facility] [address]. patient still has some hypoxemia and cardiology wanted pulmonary involved.  Plan I discussed with Mauricio Arrien - [facility] hospitalist. -Hospitalist will be primary at [facility] starting 10/30/2020 Pulmonary will consult once over the weekend sa needed -  Cardiology will be following     SIGNATURE    Dr. Kalman Shan, M.D., F.C.C.P,  Pulmonary and Critical Care Medicine Staff Physician, [facility] Director - Interstitial Lung Disease  Program  [facility] at [place], [place], [address]  NPI Number:  NPI #[id number]  Pager: [phone], If no answer  -> Check AMION or Try [phone] Telephone (clinical office): [phone] (research): [phone]  2:56 PM 10/29/2020

## 2020-10-29 NOTE — Progress Notes (Addendum)
[name] [name] [id number] July 14, 1950  CARE TEAM:  PCP: Benita Stabile, MD  Outpatient Care Team: Patient Care Team: Benita Stabile, MD as PCP - General (Internal Medicine) Rollene Rotunda, MD as PCP - Cardiology (Cardiology) Karie Soda, MD as Consulting Physician (General Surgery) Marguerita Merles, Reuel Boom, MD as Consulting Physician (Gastroenterology) Rollene Rotunda, MD as Consulting Physician (Cardiology)  Inpatient Treatment Team: Treatment Team: Attending Provider: Kalman Shan, MD; Consulting Physician: Karie Soda, MD; Attending Physician: Pccm, Md, MD; Rounding Team: Lbcardiology, Dory Peru, MD; Technician: Virginia Rochester, NT; Technician: Ward, Binnie Rail, NT; Registered Nurse: Carlynn Herald, RN; Physical Therapist: Dahlia Bailiff, PT; Registered Nurse: Severiano Gilbert, RN; Utilization Review: Carvel Getting, RN; Occupational Therapist: Kelli Churn, OT/L; Occupational Therapist: Prudence Davidson, Student-OT   Problem List:   Principal Problem:   Respiratory failure requiring intubation [facility]) Active Problems:   Obesity (BMI 30.0-34.9)   Coronary artery disease involving native coronary artery of native heart with angina pectoris (HCC)   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  [facility] Stay = 2 days)  Plan:  Pulmonary and cardiology consultations appreciated.  Plan to have patient get cardiac catheterization today per Dr. Elease Hashimoto.  Defer to them.  Okay to fully anticoagulate if needed  Foley can come out upon extubation.  Local anorectal care with oral and topical pain control.  Sitz bath's.  Solid diet as tolerated after procedures.  -VTE prophylaxis-  SCDs.  Heparin OK   -mobilize as tolerated to help recovery after extubation   Disposition: Per primary pulmonary service.       30 minutes spent in review, evaluation, examination, counseling, and coordination of care.   I have reviewed this patient's available data, including medical history, events of note, physical examination and test results as part of my evaluation.  A significant portion of that time was spent in counseling.  Care during the described time interval was provided by me.  10/29/2020    Subjective: (Chief complaint)  Patient extubated yesterday morning.  Initially doing well but then had recurrent chest pain and hypoxia.  Cardiac consultation and repeat lab and echocardiogram done.  Evidence of LE strain and possible myocardial infarction.  Some rectal soreness but nothing too severe.  Objective:  Vital signs:  Vitals:   10/29/20 0430 10/29/20 0500 10/29/20 0515 10/29/20 0700  BP: (!) 150/63  (!) 156/74 (!) 157/65  Pulse: 74  66 66  Resp: Temp:      TempSrc:      SpO2: 100%  99% 97%  Weight:  106.3 kg    Height:        Last BM Date:  (PTA)  Intake/Output   Yesterday:  10/20 0701 - 10/21 0700 In: 725.4 [P.O.:120; I.V.:605.4] Out: 1125 [Urine:1125] This shift:  No intake/output data recorded.  Bowel function:  Flatus: YES  BM:  No  Drain: (No drain)   Physical Exam:  General: Pt awake/alert in no acute distress.  Smiling, giving a thumbs up  Eyes: PERRL, normal EOM.  Sclera clear.  No icterus Neuro: CN II-XII intact w/o focal sensory/motor deficits. Lymph: No head/neck/groin lymphadenopathy Psych:  No delerium/psychosis/paranoia.  Oriented x  4 HENT: Normocephalic, Mucus membranes moist.  No thrush.  ET tube in place Neck: Supple, No tracheal deviation.  No obvious thyromegaly Chest: No pain to chest wall compression.  Good respiratory excursion.  No audible wheezing CV:  Pulses intact.  Regular rhythm.  No major  extremity edema MS: Normal AROM mjr joints.  No obvious deformity  Abdomen: Soft.  Nondistended.  Nontender.  No evidence of peritonitis.  No incarcerated hernias. Rectal - no anal bleeding  Ext:   No deformity.  No mjr edema.  No cyanosis Skin: No petechiae / purpurea.  No major sores.  Warm and dry    Results:   Cultures: Recent Results (from the past 720 hour(s))  MRSA Next Gen by PCR, Nasal     Status: None   Collection Time: 10/27/20 12:39 PM   Specimen: Nasal Mucosa; Nasal Swab  Result Value Ref Range Status   MRSA by PCR Next Gen NOT DETECTED NOT DETECTED Final    Comment: (NOTE) The GeneXpert MRSA Assay (FDA approved for NASAL specimens only), is one component of a comprehensive MRSA colonization surveillance program. It is not intended to diagnose MRSA infection nor to guide or monitor treatment for MRSA infections. Test performance is not FDA approved in patients less than 60 years old. Performed at [facility], [address], [place], [place] [address]   Resp Panel by RT-PCR (Flu A&B, Covid) Nasal Mucosa     Status: None   Collection Time: 10/27/20 12:41 PM   Specimen: Nasal Mucosa; Nasopharyngeal(NP) swabs in vial transport medium  Result Value Ref Range Status   SARS Coronavirus 2 by RT PCR NEGATIVE NEGATIVE Final    Comment: (NOTE) SARS-CoV-2 target nucleic acids are NOT DETECTED.  The SARS-CoV-2 RNA is generally detectable in upper respiratory specimens during the acute phase of infection. The lowest concentration of SARS-CoV-2 viral copies this assay can detect is 138 copies/mL. A negative result does not preclude SARS-Cov-2 infection and should not be used as the sole basis for treatment or other patient management decisions. A negative result may occur with  improper specimen collection/handling, submission of specimen other than nasopharyngeal swab, presence of viral mutation(s) within the areas targeted by this assay, and  inadequate number of viral copies(<138 copies/mL). A negative result must be combined with clinical observations, patient history, and epidemiological information. The expected result is Negative.  Fact Sheet for Patients:  BloggerCourse.com  Fact Sheet for Healthcare Providers:  SeriousBroker.it  This test is no t yet approved or cleared by the [place] FDA and  has been authorized for detection and/or diagnosis of SARS-CoV-2 by FDA under an Emergency Use Authorization (EUA). This EUA will remain  in effect (meaning this test can be used) for the duration of the COVID-19 declaration under Section 564(b)(1) of the Act, 21 U.S.C.section 360bbb-3(b)(1), unless the authorization is terminated  or revoked sooner.       Influenza A by PCR NEGATIVE NEGATIVE Final   Influenza B by PCR NEGATIVE NEGATIVE Final    Comment: (NOTE) The Xpert Xpress SARS-CoV-2/FLU/RSV plus assay is intended as an aid in the diagnosis of influenza from Nasopharyngeal swab specimens and should not be used as a sole basis for treatment. Nasal washings and aspirates are unacceptable for Xpert Xpress SARS-CoV-2/FLU/RSV testing.  Fact Sheet for Patients: BloggerCourse.com  Fact Sheet for Healthcare Providers: SeriousBroker.it  This test is not yet approved or cleared by the [place] FDA and has been authorized for detection and/or diagnosis of  SARS-CoV-2 by FDA under an Emergency Use Authorization (EUA). This EUA will remain in effect (meaning this test can be used) for the duration of the COVID-19 declaration under Section 564(b)(1) of the Act, 21 U.S.C. section 360bbb-3(b)(1), unless the authorization is terminated or revoked.  Performed at [facility], [address], [place], [place] [address]     Labs: Results for orders placed or performed during the hospital encounter  of 10/27/20 (from the past 48 hour(s))  MRSA Next Gen by PCR, Nasal     Status: None   Collection Time: 10/27/20 12:39 PM   Specimen: Nasal Mucosa; Nasal Swab  Result Value Ref Range   MRSA by PCR Next Gen NOT DETECTED NOT DETECTED    Comment: (NOTE) The GeneXpert MRSA Assay (FDA approved for NASAL specimens only), is one component of a comprehensive MRSA colonization surveillance program. It is not intended to diagnose MRSA infection nor to guide or monitor treatment for MRSA infections. Test performance is not FDA approved in patients less than 34 years old. Performed at [facility], [address], [place], [place] [address]   Resp Panel by RT-PCR (Flu A&B, Covid) Nasal Mucosa     Status: None   Collection Time: 10/27/20 12:41 PM   Specimen: Nasal Mucosa; Nasopharyngeal(NP) swabs in vial transport medium  Result Value Ref Range   SARS Coronavirus 2 by RT PCR NEGATIVE NEGATIVE    Comment: (NOTE) SARS-CoV-2 target nucleic acids are NOT DETECTED.  The SARS-CoV-2 RNA is generally detectable in upper respiratory specimens during the acute phase of infection. The lowest concentration of SARS-CoV-2 viral copies this assay can detect is 138 copies/mL. A negative result does not preclude SARS-Cov-2 infection and should not be used as the sole basis for treatment or other patient management decisions. A negative result may occur with  improper specimen collection/handling, submission of specimen other than nasopharyngeal swab, presence of viral mutation(s) within the areas targeted by this assay, and inadequate number of viral copies(<138 copies/mL). A negative result must be combined with clinical observations, patient history, and epidemiological information. The expected result is Negative.  Fact Sheet for Patients:  BloggerCourse.com  Fact Sheet for Healthcare Providers:  SeriousBroker.it  This test is no t  yet approved or cleared by the [place] FDA and  has been authorized for detection and/or diagnosis of SARS-CoV-2 by FDA under an Emergency Use Authorization (EUA). This EUA will remain  in effect (meaning this test can be used) for the duration of the COVID-19 declaration under Section 564(b)(1) of the Act, 21 U.S.C.section 360bbb-3(b)(1), unless the authorization is terminated  or revoked sooner.       Influenza A by PCR NEGATIVE NEGATIVE   Influenza B by PCR NEGATIVE NEGATIVE    Comment: (NOTE) The Xpert Xpress SARS-CoV-2/FLU/RSV plus assay is intended as an aid in the diagnosis of influenza from Nasopharyngeal swab specimens and should not be used as a sole basis for treatment. Nasal washings and aspirates are unacceptable for Xpert Xpress SARS-CoV-2/FLU/RSV testing.  Fact Sheet for Patients: BloggerCourse.com  Fact Sheet for Healthcare Providers: SeriousBroker.it  This test is not yet approved or cleared by the [place] FDA and has been authorized for detection and/or diagnosis of SARS-CoV-2 by FDA under an Emergency Use Authorization (EUA). This EUA will remain in effect (meaning this test can be used) for the duration of the COVID-19 declaration under Section 564(b)(1) of the Act, 21 U.S.C. section 360bbb-3(b)(1), unless the authorization is terminated or revoked.  Performed at [facility], [address] [place], [place] [address]   Hemoglobin A1c     Status: Abnormal   Collection Time: 10/27/20  1:03 PM  Result Value Ref Range   Hgb A1c MFr Bld 6.1 (H) 4.8 - 5.6 %    Comment: (NOTE) Pre diabetes:          5.7%-6.4%  Diabetes:              >6.4%  Glycemic control for   <7.0% adults with diabetes    Mean Plasma Glucose 128.37 mg/dL    Comment: Performed at [facility], [address] [place], [place] [address]  CBC with Differential/Platelet     Status: Abnormal    Collection Time: 10/27/20  1:03 PM  Result Value Ref Range   WBC 19.4 (H) 4.0 - 10.5 K/uL   RBC 5.80 4.22 - 5.81 MIL/uL   Hemoglobin 17.1 (H) 13.0 - 17.0 g/dL   HCT 82.9 (H) 56.2 - 13.0 %   MCV 94.3 80.0 - 100.0 fL   MCH 29.5 26.0 - 34.0 pg   MCHC 31.3 30.0 - 36.0 g/dL   RDW 86.5 (H) 78.4 - 69.6 %   Platelets 243 150 - 400 K/uL   nRBC 0.0 0.0 - 0.2 %   Neutrophils Relative % 92 %   Neutro Abs 17.7 (H) 1.7 - 7.7 K/uL   Lymphocytes Relative 5 %   Lymphs Abs 1.0 0.7 - 4.0 K/uL   Monocytes Relative 2 %   Monocytes Absolute 0.4 0.1 - 1.0 K/uL   Eosinophils Relative 0 %   Eosinophils Absolute 0.1 0.0 - 0.5 K/uL   Basophils Relative 0 %   Basophils Absolute 0.1 0.0 - 0.1 K/uL   Immature Granulocytes 1 %   Abs Immature Granulocytes 0.15 (H) 0.00 - 0.07 K/uL    Comment: Performed at [facility], [address], [place], [place] [address]  Brain natriuretic peptide     Status: None   Collection Time: 10/27/20  1:03 PM  Result Value Ref Range   B Natriuretic Peptide 56.3 0.0 - 100.0 pg/mL    Comment: Performed at [facility], [address] [place], [place] [address]  Magnesium     Status: None   Collection Time: 10/27/20  1:03 PM  Result Value Ref Range   Magnesium 2.4 1.7 - 2.4 mg/dL    Comment: Performed at [facility], [address] [place], [place] [address]  Troponin I (High Sensitivity)     Status: Abnormal   Collection Time: 10/27/20  1:03 PM  Result Value Ref Range   Troponin I (High Sensitivity) 250 (HH) <18 ng/L    Comment: CRITICAL RESULT CALLED TO, READ BACK BY AND VERIFIED WITH: COCHRAN,H. RN AT 1401 10/27/20 MULLINS,T (NOTE) Elevated high sensitivity troponin I (hsTnI) values and significant  changes across serial measurements may suggest ACS but many other  chronic and acute conditions are known to elevate hsTnI results.  Refer to the Links section for chest pain algorithms and additional   guidance. Performed at [facility], [address] [place], [place] [address]   Renal function panel     Status: Abnormal   Collection Time: 10/27/20  1:03 PM  Result Value Ref Range   Sodium 137 135 - 145 mmol/L   Potassium 5.4 (H) 3.5 - 5.1 mmol/L    Comment: SLIGHT HEMOLYSIS   Chloride 105 98 - 111 mmol/L  CO2 22 22 - 32 mmol/L   Glucose, Bld 232 (H) 70 - 99 mg/dL    Comment: Glucose reference range applies only to samples taken after fasting for at least 8 hours.   BUN 22 8 - 23 mg/dL   Creatinine, Ser 1.61 (H) 0.61 - 1.24 mg/dL   Calcium 8.5 (L) 8.9 - 10.3 mg/dL   Phosphorus 6.0 (H) 2.5 - 4.6 mg/dL   Albumin 3.8 3.5 - 5.0 g/dL   GFR, Estimated 55 (L) >60 mL/min    Comment: (NOTE) Calculated using the CKD-EPI Creatinine Equation (2021)    Anion gap 10 5 - 15    Comment: Performed at [facility], [address], [place], [place] [address]  Draw ABG 1 hour after initiation of ventilator     Status: Abnormal   Collection Time: 10/27/20  2:53 PM  Result Value Ref Range   FIO2 80.00    Delivery systems VENTILATOR    Mode PRESSURE REGULATED VOLUME CONTROL    VT 620 mL   LHR 16.0 resp/min   Peep/cpap 8.0 cm H20   pH, Arterial 7.315 (L) 7.350 - 7.450   pCO2 arterial 40.9 32.0 - 48.0 mmHg   pO2, Arterial 97.1 83.0 - 108.0 mmHg   Bicarbonate 20.2 20.0 - 28.0 mmol/L   Acid-base deficit 5.2 (H) 0.0 - 2.0 mmol/L   O2 Saturation 97.2 %   Patient temperature 98.6    Collection site LEFT RADIAL    Drawn by [id number]    Allens test (pass/fail) PASS PASS    Comment: Performed at [facility], [address] [place], [place] [address]  Troponin I (High Sensitivity)     Status: Abnormal   Collection Time: 10/27/20  3:21 PM  Result Value Ref Range   Troponin I (High Sensitivity) 593 (HH) <18 ng/L    Comment: DELTA CHECK NOTED CRITICAL RESULT CALLED TO, READ BACK BY AND VERIFIED WITH: COCHRAN,H. RN AT 1621 10/27/20  MULLINS,T (NOTE) Elevated high sensitivity troponin I (hsTnI) values and significant  changes across serial measurements may suggest ACS but many other  chronic and acute conditions are known to elevate hsTnI results.  Refer to the Links section for chest pain algorithms and additional  guidance. Performed at [facility], [address], [place], [place] [address]   Glucose, capillary     Status: Abnormal   Collection Time: 10/27/20  6:02 PM  Result Value Ref Range   Glucose-Capillary 130 (H) 70 - 99 mg/dL    Comment: Glucose reference range applies only to samples taken after fasting for at least 8 hours.   Comment 1 Notify RN    Comment 2 Document in Chart   Glucose, capillary     Status: Abnormal   Collection Time: 10/27/20  7:36 PM  Result Value Ref Range   Glucose-Capillary 131 (H) 70 - 99 mg/dL    Comment: Glucose reference range applies only to samples taken after fasting for at least 8 hours.  Potassium     Status: None   Collection Time: 10/27/20  9:25 PM  Result Value Ref Range   Potassium 4.4 3.5 - 5.1 mmol/L    Comment: DELTA CHECK NOTED Performed at [facility], [address] [place], [place] [address]   Glucose, capillary     Status: Abnormal   Collection Time: 10/27/20 11:23 PM  Result Value Ref Range   Glucose-Capillary 126 (H) 70 - 99 mg/dL    Comment: Glucose  reference range applies only to samples taken after fasting for at least 8 hours.  HIV Antibody (routine testing w rflx)     Status: None   Collection Time: 10/28/20  2:36 AM  Result Value Ref Range   HIV Screen 4th Generation wRfx Non Reactive Non Reactive    Comment: Performed at [facility], [address], [place], [place] [address]  Basic metabolic panel     Status: Abnormal   Collection Time: 10/28/20  2:36 AM  Result Value Ref Range   Sodium 139 135 - 145 mmol/L   Potassium 4.5 3.5 - 5.1 mmol/L   Chloride 109 98 - 111 mmol/L   CO2 18 (L) 22 -  32 mmol/L   Glucose, Bld 121 (H) 70 - 99 mg/dL    Comment: Glucose reference range applies only to samples taken after fasting for at least 8 hours.   BUN 25 (H) 8 - 23 mg/dL   Creatinine, Ser 5.09 (H) 0.61 - 1.24 mg/dL   Calcium 8.5 (L) 8.9 - 10.3 mg/dL   GFR, Estimated 51 (L) >60 mL/min    Comment: (NOTE) Calculated using the CKD-EPI Creatinine Equation (2021)    Anion gap 12 5 - 15    Comment: Performed at [facility], [address] [place], [place] [address]  CBC     Status: Abnormal   Collection Time: 10/28/20  2:36 AM  Result Value Ref Range   WBC 17.3 (H) 4.0 - 10.5 K/uL   RBC 5.09 4.22 - 5.81 MIL/uL   Hemoglobin 14.9 13.0 - 17.0 g/dL   HCT 24.5 80.9 - 98.3 %   MCV 92.3 80.0 - 100.0 fL   MCH 29.3 26.0 - 34.0 pg   MCHC 31.7 30.0 - 36.0 g/dL   RDW 38.2 (H) 50.5 - 39.7 %   Platelets 220 150 - 400 K/uL   nRBC 0.0 0.0 - 0.2 %    Comment: Performed at [facility], [address], [place], [place] [address]  Glucose, capillary     Status: Abnormal   Collection Time: 10/28/20  3:21 AM  Result Value Ref Range   Glucose-Capillary 139 (H) 70 - 99 mg/dL    Comment: Glucose reference range applies only to samples taken after fasting for at least 8 hours.  Glucose, capillary     Status: Abnormal   Collection Time: 10/28/20  7:40 AM  Result Value Ref Range   Glucose-Capillary 119 (H) 70 - 99 mg/dL    Comment: Glucose reference range applies only to samples taken after fasting for at least 8 hours.   Comment 1 Notify RN    Comment 2 Document in Chart   Troponin I (High Sensitivity)     Status: Abnormal   Collection Time: 10/28/20 10:25 AM  Result Value Ref Range   Troponin I (High Sensitivity) 1,685 (HH) <18 ng/L    Comment: DELTA CHECK NOTED CRITICAL RESULT CALLED TO, READ BACK BY AND VERIFIED WITH: COCHRAN,H. RN AT 1142 10/28/20 MULLINS,T (NOTE) Elevated high sensitivity troponin I (hsTnI) values and significant  changes across serial  measurements may suggest ACS but many other  chronic and acute conditions are known to elevate hsTnI results.  Refer to the Links section for chest pain algorithms and additional  guidance. Performed at [facility], [address] [place], [place] [address]   Glucose, capillary     Status: Abnormal   Collection Time: 10/28/20 12:21 PM  Result Value Ref  Range   Glucose-Capillary 124 (H) 70 - 99 mg/dL    Comment: Glucose reference range applies only to samples taken after fasting for at least 8 hours.   Comment 1 Notify RN    Comment 2 Document in Chart   Troponin I (High Sensitivity)     Status: Abnormal   Collection Time: 10/28/20  1:02 PM  Result Value Ref Range   Troponin I (High Sensitivity) 3,005 (HH) <18 ng/L    Comment: DELTA CHECK NOTED CRITICAL RESULT CALLED TO, READ BACK BY AND VERIFIED WITH: ALLEN,D. RN AT 1416 10/28/20 MULLINS,T (NOTE) Elevated high sensitivity troponin I (hsTnI) values and significant  changes across serial measurements may suggest ACS but many other  chronic and acute conditions are known to elevate hsTnI results.  Refer to the Links section for chest pain algorithms and additional  guidance. Performed at [facility], [address] [place], [place] [address]   Glucose, capillary     Status: Abnormal   Collection Time: 10/28/20  4:50 PM  Result Value Ref Range   Glucose-Capillary 108 (H) 70 - 99 mg/dL    Comment: Glucose reference range applies only to samples taken after fasting for at least 8 hours.   Comment 1 Notify RN    Comment 2 Document in Chart   Troponin I (High Sensitivity)     Status: Abnormal   Collection Time: 10/28/20  4:54 PM  Result Value Ref Range   Troponin I (High Sensitivity) 2,726 (HH) <18 ng/L    Comment: DELTA CHECK NOTED CRITICAL VALUE NOTED.  VALUE IS CONSISTENT WITH PREVIOUSLY REPORTED AND CALLED VALUE. (NOTE) Elevated high sensitivity troponin I (hsTnI) values and  significant  changes across serial measurements may suggest ACS but many other  chronic and acute conditions are known to elevate hsTnI results.  Refer to the Links section for chest pain algorithms and additional  guidance. Performed at [facility], [address], [place], [place] [address]   Troponin I (High Sensitivity)     Status: Abnormal   Collection Time: 10/28/20  7:32 PM  Result Value Ref Range   Troponin I (High Sensitivity) 2,241 (HH) <18 ng/L    Comment: CRITICAL VALUE NOTED.  VALUE IS CONSISTENT WITH PREVIOUSLY REPORTED AND CALLED VALUE. (NOTE) Elevated high sensitivity troponin I (hsTnI) values and significant  changes across serial measurements may suggest ACS but many other  chronic and acute conditions are known to elevate hsTnI results.  Refer to the Links section for chest pain algorithms and additional  guidance. Performed at [facility], [address], [place], [place] [address]   Glucose, capillary     Status: Abnormal   Collection Time: 10/28/20  9:52 PM  Result Value Ref Range   Glucose-Capillary 104 (H) 70 - 99 mg/dL    Comment: Glucose reference range applies only to samples taken after fasting for at least 8 hours.  Glucose, capillary     Status: Abnormal   Collection Time: 10/28/20 11:06 PM  Result Value Ref Range   Glucose-Capillary 112 (H) 70 - 99 mg/dL    Comment: Glucose reference range applies only to samples taken after fasting for at least 8 hours.  Basic metabolic panel     Status: Abnormal   Collection Time: 10/29/20  2:34 AM  Result Value Ref Range   Sodium 138 135 - 145 mmol/L   Potassium 4.1 3.5 - 5.1 mmol/L   Chloride 107 98 - 111 mmol/L  CO2 23 22 - 32 mmol/L   Glucose, Bld 111 (H) 70 - 99 mg/dL    Comment: Glucose reference range applies only to samples taken after fasting for at least 8 hours.   BUN 26 (H) 8 - 23 mg/dL   Creatinine, Ser 1.61 (H) 0.61 - 1.24 mg/dL   Calcium 8.2 (L) 8.9 -  10.3 mg/dL   GFR, Estimated 57 (L) >60 mL/min    Comment: (NOTE) Calculated using the CKD-EPI Creatinine Equation (2021)    Anion gap 8 5 - 15    Comment: Performed at [facility], [address] [place], [place] [address]  Magnesium     Status: None   Collection Time: 10/29/20  2:34 AM  Result Value Ref Range   Magnesium 2.0 1.7 - 2.4 mg/dL    Comment: Performed at [facility], [address], [place], [place] [address]  CBC     Status: Abnormal   Collection Time: 10/29/20  2:34 AM  Result Value Ref Range   WBC 15.1 (H) 4.0 - 10.5 K/uL   RBC 4.71 4.22 - 5.81 MIL/uL   Hemoglobin 13.8 13.0 - 17.0 g/dL   HCT 11.9 14.7 - 82.9 %   MCV 92.1 80.0 - 100.0 fL   MCH 29.3 26.0 - 34.0 pg   MCHC 31.8 30.0 - 36.0 g/dL   RDW 56.2 (H) 13.0 - 86.5 %   Platelets 170 150 - 400 K/uL   nRBC 0.0 0.0 - 0.2 %    Comment: Performed at [facility], [address], [place], [place] [address]  Glucose, capillary     Status: Abnormal   Collection Time: 10/29/20  4:07 AM  Result Value Ref Range   Glucose-Capillary 108 (H) 70 - 99 mg/dL    Comment: Glucose reference range applies only to samples taken after fasting for at least 8 hours.    Imaging / Studies: DG CHEST PORT 1 VIEW  Result Date: 10/28/2020 CLINICAL DATA:  Chest pain. EXAM: PORTABLE CHEST 1 VIEW COMPARISON:  Same day. FINDINGS: The heart size and mediastinal contours are within normal limits. Both lungs are clear. The visualized skeletal structures are unremarkable. IMPRESSION: No active disease. Electronically Signed   By: Lupita Raider M.D.   On: 10/28/2020 16:11   DG Chest Port 1 View  Result Date: 10/28/2020 CLINICAL DATA:  Check endotracheal tube placement EXAM: PORTABLE CHEST 1 VIEW COMPARISON:  Film from previous day. FINDINGS: Endotracheal tube is noted in satisfactory position. No cardiomegaly is seen. The lungs are well aerated bilaterally. Improved aeration is noted  bilaterally although persistent airspace opacities are seen. IMPRESSION: Improved aeration bilaterally.  No new focal abnormality is noted. Electronically Signed   By: Alcide Clever M.D.   On: 10/28/2020 00:23   DG Chest Port 1 View  Result Date: 10/27/2020 CLINICAL DATA:  Endotracheally intubated. EXAM: PORTABLE CHEST 1 VIEW COMPARISON:  10/27/2020 at 11:19 a.m. FINDINGS: An endotracheal tube remains in place terminating 5.5 cm above the carina. The cardiomediastinal silhouette is within normal limits for portable AP technique. Dense bilateral airspace opacities on the prior study demonstrate partial interval clearing. Remaining symmetric opacities are greatest in the perihilar regions. No sizable pleural effusion or pneumothorax is identified. IMPRESSION: Interval improvement of widespread airspace opacities which could reflect decreasing edema. Electronically Signed   By: Sebastian Ache M.D.   On: 10/27/2020 13:30    Medications / Allergies: per chart  Antibiotics: Anti-infectives (From admission, onward)  Start     Dose/Rate Route Frequency Ordered Stop   10/28/20 0600  cefTRIAXone (ROCEPHIN) 2 g in sodium chloride 0.9 % 100 mL IVPB       See Hyperspace for full Linked Orders Report.   2 g 200 mL/hr over 30 Minutes Intravenous On call to O.R. 10/27/20 1237 10/29/20 0559   10/28/20 0600  metroNIDAZOLE (FLAGYL) IVPB 500 mg       See Hyperspace for full Linked Orders Report.   500 mg 100 mL/hr over 60 Minutes Intravenous On call to O.R. 10/27/20 1237 10/29/20 0559         Note: Portions of this report may have been transcribed using voice recognition software. Every effort was made to ensure accuracy; however, inadvertent computerized transcription errors may be present.   Any transcriptional errors that result from this process are unintentional.    Ardeth Sportsman, MD, FACS, MASCRS Esophageal, Gastrointestinal & Colorectal Surgery Robotic and Minimally Invasive Surgery  [facility], [facility]  [facility]  [address], [address] [place], [place] [address] [phone] Fax [phone] Main  CONTACT INFORMATION:  Weekday (9AM-5PM): Call [facility] main office at [phone]  Weeknight (5PM-9AM) or Weekend/Holiday: Check [email or web address] (password " [email or web address]") for General Surgery [facility] coverage  (Please, do not use SecureChat as it is not reliable communication to operating surgeons for immediate patient care)      10/29/2020  7:47 AM

## 2020-10-29 NOTE — Progress Notes (Signed)
Patient has home cpap for use, RT assisted patient in setting up his cpap and patient will self place when ready.

## 2020-10-29 NOTE — Progress Notes (Signed)
NAME:  [name] [name], MRN:  [id number], DOB:  [birth year], LOS: 2 ADMISSION DATE:  10/27/2020, CONSULTATION DATE:  10/27/2020 REFERRING MD:  Dr. 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   Patient underwent [facility]orrhoide[facility] by Dr[facility] which was uncomplicated; he was prone for around 1.5 hrs.  He was extubated in PACU but developed respiratory distress, became hypoxic with pink frothy secretions suspicious for negative pressure pulmonary edema.  He was reintubated by anesthesia with CXR showing pulmonary edema.  Therefore he was transferred to [facility] ICU by [facility] for further care.  PCCM accepting.   Wife reports patient was in his normal state of health prior to surgery.  No reports of fever, URI, chest pain, leg swelling, or shortness of breath.   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  10/20 - Note elevated troponin evening 10/19.  No EKG changes. 0.50, PEEP 8 I/O  - 846 total. SCr 1.28 >> 1.39 >> 1.48. WBC 17.3. Tolerating PSV currently  Interim History / Subjective:    10/21 - extubated. Getting echo- ef some down per tech. Going for cath later today. On 6L [place] stable. Denies chest pain On NTG gtt -> MAP 109. STble. Making urine  Results for [name] [name], [name] [name] FITZHUGH VIZCARRONDO (MRN [id number]) as of 10/29/2020 09:03  Ref. Range 10/27/2020 15:21 10/28/2020 10:25 10/28/2020 13:02 10/28/2020 16:54 10/28/2020 19:32  Troponin I (High Sensitivity) Latest Ref Range: <18 ng/L 593 (HH) 1,685 (HH)  3,005 (HH) 2,726 (HH) 2,241 (HH)    Objective   Blood pressure (!) 157/65, pulse 66, temperature (!) 97.4 F (36.3 C), temperature source Axillary, resp. rate 19, height 6' (1.829 m), weight 106.3 kg, SpO2 97 %.        Intake/Output Summary (Last 24 hours) at 10/29/2020 0845 Last data filed at 10/29/2020 0700 Gross per 24 hour  Intake 700.59 ml  Output 1000 ml  Net -299.41 ml   Filed Weights   10/27/20 1300 10/28/20 0500 10/29/20 0500  Weight: 107.2 kg 108.1 kg 106.3 kg   Examination:   General Appearance:  Looks stable. Obese. Lying in bed Head:  Normocephalic, without obvious abnormality, atraumatic Eyes:  PERRL - yes, conjunctiva/corneas - clear     Ears:  Normal external ear canals, both ears Nose:  G tube - no but has [place] Throat:  ETT TUBE - no , OG tube - no Neck:  Supple,  No enlargement/tenderness/nodules Lungs: Clear to auscultation bilaterally,  Heart:  S1 and S2 normal, no murmur, CVP - no.  Pressors - no but has NTG gtt Abdomen:  Soft, no masses, no organomegaly Genitalia / Rectal:  Not done Extremities:  Extremities- intact Skin:  ntact in exposed areas . Sacral area - no examined Neurologic:  Sedation - none -> RASS - +1 . Moves all 4s - yes. CAM-ICU - neg . Orientation - x3+     Resolved Hospital Problem list     Assessment & Plan:  Baseline prior to and present  admission: Sleep apnea  on CPAP Acute hypoxic respiratory failure secondary to pulmonary edema, felt more negative pressure but r/o cardiac component  10/21 -6 L nasal cannula without any respiratory distress.  Chest x-ray some worse with pulmonary edema  Plan  - Auto CPAP nightly [changed from as needed] - Oxygen for pulse ox goal greater than 88% - Lasix x1 for 10/29/2020 will be held because he is getting fluids for cardiac cath   Hx HTN, CAD w/ previous MI/ stent, HLD - Prior to & Present on Admit -Grade 1 diastolic dysfunction echocardiogram 6/22 - Prior to & Present on Admit  -On  scheduled home aspirin, lisinopril, metoprolol and Lasix   10/29/2020: Hypertensive on nitroglycerin drip with mean arterial pressure 109  Plan -Continue nitroglycerin drip (MAP goal per cardiology] -On p.o. amlodipine scheduled with as needed Lopressor -Received 1 dose aspirin 10/28/2020 and 10/29/2020 -Dosing diuretics daily based on clinical judgment [410/21/22 will be held because he is getting fluids for cardiac cath] Cardiology to fine-tune regimen -Repeat high-sensitivity troponin t 10/30/2020 -Not on systemic anticoagulation [probably due to recent surgery] -cardiology to decide on timing an dindication  Chronic kidney disease Baseline creatinine 1.18 mg percent March 2022 and 1.28 mg percent May 2022 - Prior to & Present on Admit  Acute kidney injury 1.39 mg percent 10/27/2020 at admission  10/21 -peak creatinine 1.48 mg percent 10/28/2020 and improved to 1.35 mg percent and approaching baseline.  Responded to diuretics  Plan - Monitor with diuresis [on 10/29/2020 diuresis being held because he is getting fluids for cardiac cath]   Prolapsed Internal Hemorrhoids s/p elective uncomplicated hemorrhoidectomy -primary admission problem  10/29/2020 -no active bleeding  Plan -Postop management as per [facility] plans -Continue hydrocortisone- pramoxine as needed -Discontinue Foley catheter 10/29/2020   DM -Prior to & Present on Admit  Plan -Holding home metformin until taking enteral medications -Follow CBG, add sliding scale insulin if consistently greater than 180 (has been at goal)     Best Practice (right click and "Reselect all SmartList Selections" daily)   Diet/type: NPO DVT prophylaxis: prophylactic heparin  GI prophylaxis: PPI Lines: N/A Foley:  Yes, and it is still needed Code Status:  full code Last date of multidisciplinary goals of care discussion [10/19 with wife Cala Bradford [phone]]  Depending on cardiac cath results can probably go out of the unit  on 10/29/2020 and probably to the hospitalist service for pickup 10/30/2020.  Await cardiac cath results    ATTESTATION & SIGNATURE   The patient [name] [name] is critically ill with multiple organ systems failure and requires high complexity decision making for assessment and support, frequent evaluation and titration of therapies, application of advanced monitoring technologies and extensive interpretation of multiple databases.   Critical Care Time devoted to patient care services described in this note is  31  Minutes. This time reflects time of care of this signee Dr Kalman Shan. This critical care time does not reflect procedure time, or teaching time or supervisory time of PA/NP/Med student/Med Resident etc but could involve care discussion time     Dr. Kalman Shan, M.D., [facility].C.P Pulmonary and Critical Care Medicine Staff Physician [facility] [place] [facility] Pager: [phone], If no answer or between  15:00h - 7:00h: call [phone]  10/29/2020 8:45 AM    LABS    PULMONARY Recent Labs  Lab 10/27/20 1453  PHART 7.315*  PCO2ART 40.9  PO2ART 97.1  HCO3 20.2  O2SAT 97.2  CBC Recent Labs  Lab 10/27/20 1303 10/28/20 0236 10/29/20 0234  HGB 17.1* 14.9 13.8  HCT 54.7* 47.0 43.4  WBC 19.4* 17.3* 15.1*  PLT 243 220 170    COAGULATION No results for input(s): INR in the last 168 hours.  CARDIAC  No results for input(s): TROPONINI in the last 168 hours. No results for input(s): PROBNP in the last 168 hours.   CHEMISTRY Recent Labs  Lab 10/27/20 1303 10/27/20 2125 10/28/20 0236 10/29/20 0234  NA 137  --  139 138  K 5.4*   < > 4.5 4.1  CL 105  --  109 107  CO2 22  --  18* 23  GLUCOSE 232*  --  121* 111*  BUN 22  --  25* 26*  CREATININE 1.39*  --  1.48* 1.35*  CALCIUM 8.5*  --  8.5* 8.2*  MG 2.4  --   --  2.0  PHOS 6.0*  --   --   --    < > = values in this interval not displayed.   Estimated  Creatinine Clearance: 65.1 mL/min (A) (by C-G formula based on SCr of 1.35 mg/dL (H)).   LIVER Recent Labs  Lab 10/27/20 1303  ALBUMIN 3.8     INFECTIOUS No results for input(s): LATICACIDVEN, PROCALCITON in the last 168 hours.   ENDOCRINE CBG (last 3)  Recent Labs    10/28/20 2152 10/28/20 2306 10/29/20 0407  GLUCAP 104* 112* 108*         IMAGING x48h  - image(s) personally visualized  -   highlighted in bold DG Chest Port 1 View  Result Date: 10/29/2020 CLINICAL DATA:  Respiratory failure EXAM: PORTABLE CHEST 1 VIEW COMPARISON:  Radiograph 10/28/2020 FINDINGS: Unchanged cardiomediastinal silhouette. There are increased perihilar and right upper lobe opacities in comparison to prior exam. Trace left pleural effusion. No visible pneumothorax. No acute osseous abnormality. IMPRESSION: Mild pulmonary edema, increased from prior exam. Electronically Signed   By: Caprice Renshaw M.D.   On: 10/29/2020 08:27   DG CHEST PORT 1 VIEW  Result Date: 10/28/2020 CLINICAL DATA:  Chest pain. EXAM: PORTABLE CHEST 1 VIEW COMPARISON:  Same day. FINDINGS: The heart size and mediastinal contours are within normal limits. Both lungs are clear. The visualized skeletal structures are unremarkable. IMPRESSION: No active disease. Electronically Signed   By: Lupita Raider M.D.   On: 10/28/2020 16:11   DG Chest Port 1 View  Result Date: 10/28/2020 CLINICAL DATA:  Check endotracheal tube placement EXAM: PORTABLE CHEST 1 VIEW COMPARISON:  Film from previous day. FINDINGS: Endotracheal tube is noted in satisfactory position. No cardiomegaly is seen. The lungs are well aerated bilaterally. Improved aeration is noted bilaterally although persistent airspace opacities are seen. IMPRESSION: Improved aeration bilaterally.  No new focal abnormality is noted. Electronically Signed   By: Alcide Clever M.D.   On: 10/28/2020 00:23   DG Chest Port 1 View  Result Date: 10/27/2020 CLINICAL DATA:  Endotracheally  intubated. EXAM: PORTABLE CHEST 1 VIEW COMPARISON:  10/27/2020 at 11:19 a.m. FINDINGS: An endotracheal tube remains in place terminating 5.5 cm above the carina. The cardiomediastinal silhouette is within normal limits for portable AP technique. Dense bilateral airspace opacities on the prior study demonstrate partial interval clearing. Remaining symmetric opacities are greatest in the perihilar regions. No sizable pleural effusion or pneumothorax is identified. IMPRESSION: Interval improvement of widespread airspace opacities which could reflect decreasing edema. Electronically Signed   By: Sebastian Ache M.D.   On:  10/27/2020 13:30    

## 2020-10-29 NOTE — Progress Notes (Signed)
Progress Note  Patient Name: [name] [name] Date of Encounter: 10/29/2020  [facility] Cardiologist: Rollene Rotunda, MD    Subjective   70 yo with CAD, s/p BMS to RCA  in 2008, OSA, HTN, HLD Admitted for hemorrhoidectomy Post op had respiratory distress, + troponins to 2726 Has improved with bronchodilators and 1.4 liter diuresis Scheduled for cath today   He is able to lie flat without any difficulty   Inpatient Medications    Scheduled Meds:  acetaminophen  1,000 mg Oral Q6H   amLODipine  5 mg Oral Daily   Chlorhexidine Gluconate Cloth  6 each Topical Daily   heparin  5,000 Units Subcutaneous Q8H   mouth rinse  15 mL Mouth Rinse BID   metoprolol tartrate  25 mg Oral BID   pantoprazole (PROTONIX) IV  40 mg Intravenous Daily   polycarbophil  625 mg Oral BID   sodium chloride flush  3 mL Intravenous Q12H   Continuous Infusions:  sodium chloride 10 mL/hr at 10/29/20 0400   sodium chloride     sodium chloride 75 mL/hr at 10/29/20 0533   nitroGLYCERIN 95 mcg/min (10/29/20 0400)   PRN Meds: sodium chloride, sodium chloride, albuterol, fentaNYL (SUBLIMAZE) injection, fentaNYL (SUBLIMAZE) injection, hydrocortisone-pramoxine, metoprolol tartrate, oxyCODONE, sodium chloride flush   Vital Signs    Vitals:   10/29/20 0300 10/29/20 0400 10/29/20 0430 10/29/20 0500  BP: (!) 129/47 (!) 129/55 (!) 150/63   Pulse: 68 65 74   Resp: 17 15 15    Temp:  98.1 F (36.7 C)    TempSrc:  Oral    SpO2: 99% 99% 100%   Weight:    106.3 kg  Height:        Intake/Output Summary (Last 24 hours) at 10/29/2020 0603 Last data filed at 10/29/2020 0533 Gross per 24 hour  Intake 543.85 ml  Output 1125 ml  Net -581.15 ml   Last 3 Weights 10/29/2020 10/28/2020 10/27/2020  Weight (lbs) 234 lb 5.6 oz 238 lb 5.1 oz 236 lb 5.3 oz  Weight (kg) 106.3 kg 108.1 kg 107.2 kg      Telemetry  NSR - Personally Reviewed  ECG     - Personally Reviewed  Physical Exam   GEN: middle age  male,  lying flat in bed   Neck: No JVD Cardiac: RRR, no murmurs, rubs, or gallops.  Respiratory: few scattered wheezes  GI: Soft, nontender, non-distended  MS: No edema; No deformity. Neuro:  Nonfocal  Psych: Normal affect   Labs    High Sensitivity Troponin:   Recent Labs  Lab 10/27/20 1521 10/28/20 1025 10/28/20 1302 10/28/20 1654 10/28/20 1932  TROPONINIHS 593* 1,685* 3,005* 2,726* 2,241*     Chemistry Recent Labs  Lab 10/27/20 1303 10/27/20 2125 10/28/20 0236 10/29/20 0234  NA 137  --  139 138  K 5.4* 4.4 4.5 4.1  CL 105  --  109 107  CO2 22  --  18* 23  GLUCOSE 232*  --  121* 111*  BUN 22  --  25* 26*  CREATININE 1.39*  --  1.48* 1.35*  CALCIUM 8.5*  --  8.5* 8.2*  MG 2.4  --   --  2.0  ALBUMIN 3.8  --   --   --   GFRNONAA 55*  --  51* 57*  ANIONGAP 10  --  12 8    Lipids No results for input(s): CHOL, TRIG, HDL, LABVLDL, LDLCALC, CHOLHDL in the last 168 hours.  Hematology Recent Labs  Lab 10/27/20 1303 10/28/20 0236 10/29/20 0234  WBC 19.4* 17.3* 15.1*  RBC 5.80 5.09 4.71  HGB 17.1* 14.9 13.8  HCT 54.7* 47.0 43.4  MCV 94.3 92.3 92.1  MCH 29.5 29.3 29.3  MCHC 31.3 31.7 31.8  RDW 15.8* 15.8* 15.9*  PLT 243 220 170   Thyroid No results for input(s): TSH, FREET4 in the last 168 hours.  BNP Recent Labs  Lab 10/27/20 1303  BNP 56.3    DDimer No results for input(s): DDIMER in the last 168 hours.   Radiology    DG CHEST PORT 1 VIEW  Result Date: 10/28/2020 CLINICAL DATA:  Chest pain. EXAM: PORTABLE CHEST 1 VIEW COMPARISON:  Same day. FINDINGS: The heart size and mediastinal contours are within normal limits. Both lungs are clear. The visualized skeletal structures are unremarkable. IMPRESSION: No active disease. Electronically Signed   By: Lupita Raider M.D.   On: 10/28/2020 16:11   DG Chest Port 1 View  Result Date: 10/28/2020 CLINICAL DATA:  Check endotracheal tube placement EXAM: PORTABLE CHEST 1 VIEW COMPARISON:  Film from previous  day. FINDINGS: Endotracheal tube is noted in satisfactory position. No cardiomegaly is seen. The lungs are well aerated bilaterally. Improved aeration is noted bilaterally although persistent airspace opacities are seen. IMPRESSION: Improved aeration bilaterally.  No new focal abnormality is noted. Electronically Signed   By: Alcide Clever M.D.   On: 10/28/2020 00:23   DG Chest Port 1 View  Result Date: 10/27/2020 CLINICAL DATA:  Endotracheally intubated. EXAM: PORTABLE CHEST 1 VIEW COMPARISON:  10/27/2020 at 11:19 a.m. FINDINGS: An endotracheal tube remains in place terminating 5.5 cm above the carina. The cardiomediastinal silhouette is within normal limits for portable AP technique. Dense bilateral airspace opacities on the prior study demonstrate partial interval clearing. Remaining symmetric opacities are greatest in the perihilar regions. No sizable pleural effusion or pneumothorax is identified. IMPRESSION: Interval improvement of widespread airspace opacities which could reflect decreasing edema. Electronically Signed   By: Sebastian Ache M.D.   On: 10/27/2020 13:30    Cardiac Studies      Patient Profile     70 y.o. male    Assessment & Plan     1.  Acute coronary syndrome: This troponins are 3000.  He had an acute respiratory event following hemorrhoidectomy surgery.  He has a history of coronary artery disease.  I have scheduled him for heart catheterization today.  His lungs are much better.  He does have some slight wheezing but he is able to lie flat and appears to be very comfortable.  We had discussed yesterday all of the issues surrounding heart catheterization including risk, benefits, options.  He understands and agrees to proceed.  2.  Hyperlipidemia: Stable  3.  Hypertension: We will continue current medications and titrate as needed.        For questions or updates, please contact [facility] Please consult [email or web address] for contact info under         Signed, Kristeen Miss, MD  10/29/2020, 6:03 AM

## 2020-10-29 NOTE — Progress Notes (Signed)
  Echocardiogram 2D Echocardiogram has been performed.  [name] [name] 10/29/2020, 10:01 AM

## 2020-10-29 NOTE — CV Procedure (Addendum)
50% mid LAD and 40% ostial large diagonal Left main is widely patent Circumflex system is small and without obstructive disease Mid RCA stent is widely patent.  Within the mid RCA, there is eccentric 50% narrowing. Mid anterior wall hypokinesis.  LVEDP 15 mmHg. Right heart pressures: Capillary wedge pressure mean, 15 mmHg.  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

## 2020-10-30 ENCOUNTER — Inpatient Hospital Stay (HOSPITAL_COMMUNITY): Payer: Medicare Other

## 2020-10-30 DIAGNOSIS — I214 Non-ST elevation (NSTEMI) myocardial infarction: Secondary | ICD-10-CM | POA: Diagnosis not present

## 2020-10-30 LAB — COMPREHENSIVE METABOLIC PANEL
ALT: 19 U/L (ref 0–44)
AST: 38 U/L (ref 15–41)
Albumin: 3.1 g/dL — ABNORMAL LOW (ref 3.5–5.0)
Alkaline Phosphatase: 70 U/L (ref 38–126)
Anion gap: 8 (ref 5–15)
BUN: 18 mg/dL (ref 8–23)
CO2: 20 mmol/L — ABNORMAL LOW (ref 22–32)
Calcium: 8.3 mg/dL — ABNORMAL LOW (ref 8.9–10.3)
Chloride: 109 mmol/L (ref 98–111)
Creatinine, Ser: 1.15 mg/dL (ref 0.61–1.24)
GFR, Estimated: >60 mL/min (ref >60)
Glucose, Bld: 119 mg/dL — ABNORMAL HIGH (ref 70–99)
Potassium: 3.7 mmol/L (ref 3.5–5.1)
Sodium: 137 mmol/L (ref 135–145)
Total Bilirubin: 1.6 mg/dL — ABNORMAL HIGH (ref 0.3–1.2)
Total Protein: 6 g/dL — ABNORMAL LOW (ref 6.5–8.1)

## 2020-10-30 LAB — TROPONIN I (HIGH SENSITIVITY): Troponin I (High Sensitivity): 561 ng/L (ref <18)

## 2020-10-30 LAB — CBC
HCT: 36.8 % — ABNORMAL LOW (ref 39.0–52.0)
Hemoglobin: 12.1 g/dL — ABNORMAL LOW (ref 13.0–17.0)
MCH: 29.2 pg (ref 26.0–34.0)
MCHC: 32.9 g/dL (ref 30.0–36.0)
MCV: 88.9 fL (ref 80.0–100.0)
Platelets: 169 10*3/uL (ref 150–400)
RBC: 4.14 MIL/uL — ABNORMAL LOW (ref 4.22–5.81)
RDW: 15.8 % — ABNORMAL HIGH (ref 11.5–15.5)
WBC: 12.3 10*3/uL — ABNORMAL HIGH (ref 4.0–10.5)
nRBC: 0 % (ref 0.0–0.2)

## 2020-10-30 LAB — PHOSPHORUS: Phosphorus: 1.9 mg/dL — ABNORMAL LOW (ref 2.5–4.6)

## 2020-10-30 LAB — GLUCOSE, CAPILLARY
Glucose-Capillary: 112 mg/dL — ABNORMAL HIGH (ref 70–99)
Glucose-Capillary: 112 mg/dL — ABNORMAL HIGH (ref 70–99)
Glucose-Capillary: 116 mg/dL — ABNORMAL HIGH (ref 70–99)
Glucose-Capillary: 120 mg/dL — ABNORMAL HIGH (ref 70–99)

## 2020-10-30 MED ORDER — ATORVASTATIN CALCIUM 40 MG PO TABS
40.0000 mg | ORAL_TABLET | Freq: Every day | ORAL | Status: DC
  Administered 2020-10-30 – 2020-10-31 (×2): 40 mg via ORAL
  Filled 2020-10-30 (×2): qty 1

## 2020-10-30 MED ORDER — POTASSIUM PHOSPHATES 15 MMOLE/5ML IV SOLN
15.0000 mmol | Freq: Once | INTRAVENOUS | Status: AC
  Administered 2020-10-30: 15 mmol via INTRAVENOUS
  Filled 2020-10-30: qty 5

## 2020-10-30 NOTE — Progress Notes (Signed)
Denies CP or SOB at this time. O2 sats = 95% on RA. BP controlled. NTG gtt weaned to off.

## 2020-10-30 NOTE — Progress Notes (Addendum)
   NAME:  [name] [name], MRN:  [id number], DOB:  Feb 28, 1950, LOS: 3 ADMISSION DATE:  10/27/2020, CONSULTATION DATE:  10/27/2020 REFERRING MD:  Dr. 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   Patient underwent hemorrhoidectomy by Dr. Michaell Cowing [facility] which was uncomplicated; he was prone for around 1.5 hrs.  He was extubated in PACU but developed respiratory distress, became hypoxic with pink frothy secretions suspicious for negative pressure pulmonary edema.  He was reintubated by anesthesia with CXR showing pulmonary edema.  Therefore he was transferred to [facility] ICU by [facility] for further care.  PCCM accepting.   Wife reports patient was in his normal state of health prior to surgery.  No reports of fever, URI, chest pain, leg swelling, or shortness of breath.   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  10/20 - Note elevated troponin evening 10/19.  No EKG changes. 0.50, PEEP 8 I/O  - 846 total. SCr 1.28 >> 1.39 >> 1.48. WBC 17.3. Tolerating PSV  Feeling great today  Interim History / Subjective:  Post cath 10/21   Objective   Blood pressure 131/66, pulse 67, temperature 97.8 F (36.6 C), temperature source Oral, resp. rate 20, height 6' (1.829 m), weight 104.1 kg, SpO2 96 %.        Intake/Output Summary (Last 24 hours) at 10/30/2020 1527 Last data filed at 10/30/2020 0531 Gross per 24 hour  Intake 871.72 ml  Output 850 ml  Net 21.72 ml   Filed Weights   10/28/20 0500 10/29/20  0500 10/30/20 0413  Weight: 108.1 kg 106.3 kg 104.1 kg   Examination:   General Appearance: Comfortable, in no distress Head:  Normocephalic, without obvious abnormality, atraumatic Eyes: Pupils equal and reactive Ears:  Normal external ear canals, both ears Nose:  G tube - no but has [place] Neck: No adenopathy Lungs: Clear bilaterally Heart:  S1 and S2 normal, no murmur  Resolved Hospital Problem list     Assessment & Plan:  Acute hypoxemic respiratory failure -Now on room air -Clear to auscultation  -Ambulated in the hallway without desaturating  History of sleep apnea -Compliant with CPAP use  Coronary artery disease Diastolic dysfunction -No significant coronary artery disease on cardiac cath  Chronic kidney disease -Acute kidney injury present on admission -Improving  Prolapsed internal hemorrhoids status postelective uncomplicated hemorrhoidectomy--primary reason for admission   Improving from a pulmonary perspective May be discharged from a pulmonary perspective  Will sign off  Virl Diamond, MD [facility] Pager: See Loretha Stapler

## 2020-10-30 NOTE — Progress Notes (Signed)
PROGRESS NOTE    [name] [name]  WPY:[id number] DOB: 04-20-1950 DOA: 10/27/2020 PCP: Benita Stabile, MD  Outpatient Specialists:   Brief Narrative:  Patient is a 70 year old Caucasian male with past medical history significant for OSA compliant with CPAP, OSA on CPAP, HTN, CAD with MI 2016 with BMS, DMT2, and HLD presenting from Surgical center with respiratory failure.    Patient underwent hemorrhoidectomy by Dr. Michaell Cowing at the [facility] which was uncomplicated; he was prone for around 1.5 hrs.  He was extubated in PACU but developed respiratory distress, became hypoxic with pink frothy secretions flash pulmonary edema.  He was reintubated by anesthesia with CXR showing pulmonary edema.  Patient was initially under the critical care team was a long hospital but was transferred over to [facility] under the hospitalist team yesterday.  Reason for transfer is shortness of breath and chest pain.  There were concerns for acute MI.  Patient underwent cardiac catheterization without any significant changes.  Pulmonary edema has improved significantly.  According to the patient, he is less short of breath today and able to communicate with a lot better.  No chest pain.  No other constitutional symptoms reported.  Cardiac catheterization done on 10/29/2020 revealed:                         50% mid LAD and 40% ostial large diagonal Left main is widely patent Circumflex system is small and without obstructive disease Mid RCA stent is widely patent.  Within the mid RCA, there is eccentric 50% narrowing. Mid anterior wall hypokinesis.  LVEDP 15 mmHg. Right heart pressures: Capillary wedge pressure mean, 15 mmHg.  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       Assessment &  Plan:   Principal Problem:   Non-ST elevation (NSTEMI) myocardial infarction [facility]) Active Problems:   Obesity (BMI 30.0-34.9)   Coronary artery disease involving native coronary artery of native heart with angina pectoris (HCC)   Respiratory failure requiring intubation (HCC)   Status post hemorrhoidectomy 10/27/2020   Acute respiratory failure with hypoxia (HCC)   CKD (chronic kidney disease) stage 3, GFR 30-59 ml/min (HCC)   Acute hypoxic respiratory failure secondary to flash pulmonary edema: -Resolved significantly. -No further shortness of breath or chest pain reported. -Supplemented oxygen will be titrated to 0.  Patient was not on oxygen prior to admission.. -Likely discharge tomorrow.    NSTEMI/flash pulmonary edema/chest pain: -Patient underwent cardiac catheterization. -See above. -No further chest pain no shortness of breath reported. -Cardiology input is appreciated.  Grade 1 diastolic dysfunction echocardiogram 6/22 - Prior to & Present on Admit  -Continue home aspirin, lisinopril, metoprolol and Lasix    Hypertensive urgency: -Continue nitroglycerin.   -Gradually optimize.   Acute kidney injury versus acute kidney injury on chronic kidney disease:  -Acute kidney injury has resolved. -Serum creatinine is down to 1.15.     Prolapsed Internal Hemorrhoids s/p elective uncomplicated hemorrhoidectomy -primary admission problem 10/30/2020: No bleeding. Will defer to the surgical team.   DM: -Continue to optimize.  OSA: -Continue home CPAP.   DVT prophylaxis: Subcutaneous heparin. Code Status: Full code Family Communication:  Disposition Plan: Home   Consultants:  Cardiology  Procedures:  Cardiac catheterization:   Antimicrobials:  None   Subjective: No shortness of breath. No  chest pain  Objective: Vitals:   10/30/20 0036 10/30/20 0332 10/30/20 0413 10/30/20 0700  BP: 114/69 112/62  135/72  Pulse: 73 69  78  Resp:    16  Temp:  (!) 97.5  F (36.4 C) (!) 97.5 F (36.4 C)   TempSrc:   Oral   SpO2: 95% 96%  96%  Weight:   104.1 kg   Height:        Intake/Output Summary (Last 24 hours) at 10/30/2020 0929 Last data filed at 10/30/2020 0531 Gross per 24 hour  Intake 871.72 ml  Output 1140 ml  Net -268.28 ml   Filed Weights   10/28/20 0500 10/29/20 0500 10/30/20 0413  Weight: 108.1 kg 106.3 kg 104.1 kg    Examination:  General exam: Patient is obese.  Patient is awake and alert.  Patient is not in any distress.  Patient is able to communicate freely without any shortness of breath.   Respiratory system: Clear to auscultation.  Cardiovascular system: S1 & S2  Gastrointestinal system: Abdomen is obese, soft and nontender.  Organs are difficult to assess.   Central nervous system: Awake and alert.  Patient moves all extremities.  Extremities: No leg edema.    Data Reviewed: I have personally reviewed following labs and imaging studies  CBC: Recent Labs  Lab 10/27/20 1303 10/28/20 0236 10/29/20 0234 10/29/20 1250 10/29/20 1511 10/30/20 0121  WBC 19.4* 17.3* 15.1*  --  13.1* 12.3*  NEUTROABS 17.7*  --   --   --   --   --   HGB 17.1* 14.9 13.8 12.9*  12.9* 13.6 12.1*  HCT 54.7* 47.0 43.4 38.0*  38.0* 41.0 36.8*  MCV 94.3 92.3 92.1  --  89.9 88.9  PLT 243 220 170  --  182 169   Basic Metabolic Panel: Recent Labs  Lab 10/27/20 1303 10/27/20 2125 10/28/20 0236 10/29/20 0234 10/29/20 1250 10/29/20 1511 10/30/20 0121  NA 137  --  139 138 141  141  --  137  K 5.4* 4.4 4.5 4.1 3.6  3.5  --  3.7  CL 105  --  109 107  --   --  109  CO2 22  --  18* 23  --   --  20*  GLUCOSE 232*  --  121* 111*  --   --  119*  BUN 22  --  25* 26*  --   --  18  CREATININE 1.39*  --  1.48* 1.35*  --  1.16 1.15  CALCIUM 8.5*  --  8.5* 8.2*  --   --  8.3*  MG 2.4  --   --  2.0  --   --   --   PHOS 6.0*  --   --   --   --   --  1.9*   GFR: Estimated Creatinine Clearance: 75.6 mL/min (by C-G formula based on SCr of 1.15  mg/dL). Liver Function Tests: Recent Labs  Lab 10/27/20 1303 10/30/20 0121  AST  --  38  ALT  --  19  ALKPHOS  --  70  BILITOT  --  1.6*  PROT  --  6.0*  ALBUMIN 3.8 3.1*   No results for input(s): LIPASE, AMYLASE in the last 168 hours. No results for input(s): AMMONIA in the last 168 hours. Coagulation Profile: No results for input(s): INR, PROTIME in the last 168 hours. Cardiac Enzymes: No results for input(s): CKTOTAL, CKMB, CKMBINDEX, TROPONINI in the last 168 hours. BNP (last  3 results) No results for input(s): PROBNP in the last 8760 hours. HbA1C: Recent Labs    10/27/20 1303  HGBA1C 6.1*   CBG: Recent Labs  Lab 10/29/20 0931 10/29/20 1343 10/29/20 1649 10/29/20 2129 10/30/20 0807  GLUCAP 102* 112* 108* 113* 112*   Lipid Profile: No results for input(s): CHOL, HDL, LDLCALC, TRIG, CHOLHDL, LDLDIRECT in the last 72 hours. Thyroid Function Tests: No results for input(s): TSH, T4TOTAL, FREET4, T3FREE, THYROIDAB in the last 72 hours. Anemia Panel: No results for input(s): VITAMINB12, FOLATE, FERRITIN, TIBC, IRON, RETICCTPCT in the last 72 hours. Urine analysis:    Component Value Date/Time   COLORURINE yellow 06/14/2009 1546   APPEARANCEUR Clear 06/14/2009 1546   LABSPEC <1.005 06/14/2009 1546   PHURINE 5.0 06/14/2009 1546   HGBUR negative 06/14/2009 1546   BILIRUBINUR negative 06/14/2009 1546   UROBILINOGEN 0.2 06/14/2009 1546   NITRITE negative 06/14/2009 1546   Sepsis Labs: @LABRCNTIP (procalcitonin:4,lacticidven:4)  ) Recent Results (from the past 240 hour(s))  MRSA Next Gen by PCR, Nasal     Status: None   Collection Time: 10/27/20 12:39 PM   Specimen: Nasal Mucosa; Nasal Swab  Result Value Ref Range Status   MRSA by PCR Next Gen NOT DETECTED NOT DETECTED Final    Comment: (NOTE) The GeneXpert MRSA Assay (FDA approved for NASAL specimens only), is one component of a comprehensive MRSA colonization surveillance program. It is not intended to  diagnose MRSA infection nor to guide or monitor treatment for MRSA infections. Test performance is not FDA approved in patients less than 32 years old. Performed at [facility], [address], [place], [place] [address]   Resp Panel by RT-PCR (Flu A&B, Covid) Nasal Mucosa     Status: None   Collection Time: 10/27/20 12:41 PM   Specimen: Nasal Mucosa; Nasopharyngeal(NP) swabs in vial transport medium  Result Value Ref Range Status   SARS Coronavirus 2 by RT PCR NEGATIVE NEGATIVE Final    Comment: (NOTE) SARS-CoV-2 target nucleic acids are NOT DETECTED.  The SARS-CoV-2 RNA is generally detectable in upper respiratory specimens during the acute phase of infection. The lowest concentration of SARS-CoV-2 viral copies this assay can detect is 138 copies/mL. A negative result does not preclude SARS-Cov-2 infection and should not be used as the sole basis for treatment or other patient management decisions. A negative result may occur with  improper specimen collection/handling, submission of specimen other than nasopharyngeal swab, presence of viral mutation(s) within the areas targeted by this assay, and inadequate number of viral copies(<138 copies/mL). A negative result must be combined with clinical observations, patient history, and epidemiological information. The expected result is Negative.  Fact Sheet for Patients:  BloggerCourse.com  Fact Sheet for Healthcare Providers:  SeriousBroker.it  This test is no t yet approved or cleared by the [place] FDA and  has been authorized for detection and/or diagnosis of SARS-CoV-2 by FDA under an Emergency Use Authorization (EUA). This EUA will remain  in effect (meaning this test can be used) for the duration of the COVID-19 declaration under Section 564(b)(1) of the Act, 21 U.S.C.section 360bbb-3(b)(1), unless the authorization is terminated  or revoked sooner.        Influenza A by PCR NEGATIVE NEGATIVE Final   Influenza B by PCR NEGATIVE NEGATIVE Final    Comment: (NOTE) The Xpert Xpress SARS-CoV-2/FLU/RSV plus assay is intended as an aid in the diagnosis of influenza from Nasopharyngeal swab specimens and should not be used as a sole  basis for treatment. Nasal washings and aspirates are unacceptable for Xpert Xpress SARS-CoV-2/FLU/RSV testing.  Fact Sheet for Patients: BloggerCourse.com  Fact Sheet for Healthcare Providers: SeriousBroker.it  This test is not yet approved or cleared by the [place] FDA and has been authorized for detection and/or diagnosis of SARS-CoV-2 by FDA under an Emergency Use Authorization (EUA). This EUA will remain in effect (meaning this test can be used) for the duration of the COVID-19 declaration under Section 564(b)(1) of the Act, 21 U.S.C. section 360bbb-3(b)(1), unless the authorization is terminated or revoked.  Performed at [facility], [address] [place], [place] [address]          Radiology Studies: CARDIAC CATHETERIZATION  Result Date: 10/29/2020 CONCLUSIONS Widely patent left main Segmental mid 40 to 60% LAD.  First diagonal 60% ostial. Circumflex is relatively small and without any significant disease. Dominant right coronary.  Widely patent mid RCA stent.  Ectatic RCA beyond stent with 30% narrowing. Mid anterior wall severe hypokinesis.  LVEF 40 to 50%.  LVEDP 15 mmHg. Normal right heart pressures Hypoxia despite high flow oxygen, uncertain etiology. 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 Resume beta blocker. Pulmonary embolism has not been excluded but seems less likely. Will notify pulmonary medicine the patient will be kept at [facility] and that there continued management is needed.   DG Chest Port 1 View  Result Date: 10/29/2020 CLINICAL  DATA:  Respiratory failure EXAM: PORTABLE CHEST 1 VIEW COMPARISON:  Radiograph 10/28/2020 FINDINGS: Unchanged cardiomediastinal silhouette. There are increased perihilar and right upper lobe opacities in comparison to prior exam. Trace left pleural effusion. No visible pneumothorax. No acute osseous abnormality. IMPRESSION: Mild pulmonary edema, increased from prior exam. Electronically Signed   By: Caprice Renshaw M.D.   On: 10/29/2020 08:27   DG CHEST PORT 1 VIEW  Result Date: 10/28/2020 CLINICAL DATA:  Chest pain. EXAM: PORTABLE CHEST 1 VIEW COMPARISON:  Same day. FINDINGS: The heart size and mediastinal contours are within normal limits. Both lungs are clear. The visualized skeletal structures are unremarkable. IMPRESSION: No active disease. Electronically Signed   By: Lupita Raider M.D.   On: 10/28/2020 16:11   ECHOCARDIOGRAM LIMITED  Result Date: 10/29/2020    ECHOCARDIOGRAM LIMITED REPORT   Patient Name:   [name] [name] Date of Exam: 10/29/2020 Medical Rec #:  [id number]       Height:       72.0 in Accession #:    [id number]      Weight:       234.3 lb Date of Birth:  1950-05-11        BSA:          2.279 m Patient Age:    70 years        BP:           157/65 mmHg Patient Gender: M               HR:           60 bpm. Exam Location:  Inpatient Procedure: Limited Echo, Limited Color Doppler and Cardiac Doppler Indications:    R07.9* Chest pain, unspecified  History:        Patient has prior history of Echocardiogram examinations, most                 recent 06/14/2020. CAD and Previous Myocardial Infarction; Risk  Factors:Diabetes, Dyslipidemia, Hypertension and Sleep Apnea.  Sonographer:    Eulah Pont RDCS Referring Phys: [phone] HAO MENG IMPRESSIONS  1. Left ventricular ejection fraction, by estimation, is 55 to 60%. The left ventricle has normal function. The left ventricle has no regional wall motion abnormalities. The left ventricular internal cavity size was mildly dilated. Left  ventricular diastolic parameters are consistent with Grade I diastolic dysfunction (impaired relaxation).  2. Right ventricular systolic function is normal. The right ventricular size is normal.  3. The mitral valve is normal in structure. Trivial mitral valve regurgitation. No evidence of mitral stenosis.  4. The aortic valve is tricuspid. Aortic valve regurgitation is not visualized. No aortic stenosis is present.  5. Aortic dilatation noted. There is borderline dilatation of the ascending aorta, measuring 39 mm.  6. The inferior vena cava is normal in size with greater than 50% respiratory variability, suggesting right atrial pressure of 3 mmHg. FINDINGS  Left Ventricle: Left ventricular ejection fraction, by estimation, is 55 to 60%. The left ventricle has normal function. The left ventricle has no regional wall motion abnormalities. The left ventricular internal cavity size was mildly dilated. There is  no left ventricular hypertrophy. Left ventricular diastolic parameters are consistent with Grade I diastolic dysfunction (impaired relaxation). Right Ventricle: The right ventricular size is normal. Right ventricular systolic function is normal. Left Atrium: Left atrial size was normal in size. Right Atrium: Right atrial size was normal in size. Pericardium: There is no evidence of pericardial effusion. Mitral Valve: The mitral valve is normal in structure. Trivial mitral valve regurgitation. No evidence of mitral valve stenosis. Tricuspid Valve: The tricuspid valve is normal in structure. Tricuspid valve regurgitation is trivial. No evidence of tricuspid stenosis. Aortic Valve: The aortic valve is tricuspid. Aortic valve regurgitation is not visualized. No aortic stenosis is present. Pulmonic Valve: The pulmonic valve was not well visualized. Pulmonic valve regurgitation is not visualized. No evidence of pulmonic stenosis. Aorta: Aortic dilatation noted. There is borderline dilatation of the ascending aorta,  measuring 39 mm. Venous: The inferior vena cava is normal in size with greater than 50% respiratory variability, suggesting right atrial pressure of 3 mmHg. IAS/Shunts: No atrial level shunt detected by color flow Doppler. LEFT VENTRICLE PLAX 2D LVIDd:         5.60 cm      Diastology LVIDs:         4.40 cm      LV e' medial:    7.50 cm/s LV PW:         1.00 cm      LV E/e' medial:  12.7 LV IVS:        0.90 cm      LV e' lateral:   7.53 cm/s LVOT diam:     2.20 cm      LV E/e' lateral: 12.6 LV SV:         72 LV SV Index:   32 LVOT Area:     3.80 cm  LV Volumes (MOD) LV vol d, MOD A2C: 146.0 ml LV vol d, MOD A4C: 164.0 ml LV vol s, MOD A2C: 75.5 ml LV vol s, MOD A4C: 79.0 ml LV SV MOD A2C:     70.5 ml LV SV MOD A4C:     164.0 ml LV SV MOD BP:      78.2 ml RIGHT VENTRICLE RV S prime:     9.73 cm/s TAPSE (M-mode): 2.6 cm LEFT ATRIUM  Index LA diam:    4.40 cm 1.93 cm/m  AORTIC VALVE LVOT Vmax:   108.00 cm/s LVOT Vmean:  68.400 cm/s LVOT VTI:    0.189 m  AORTA Ao Root diam: 3.80 cm Ao Asc diam:  3.90 cm MITRAL VALVE MV Area (PHT): 4.21 cm     SHUNTS MV Decel Time: 180 msec     Systemic VTI:  0.19 m MV E velocity: 95.10 cm/s   Systemic Diam: 2.20 cm MV A velocity: 110.00 cm/s MV E/A ratio:  0.86 Olga Millers MD Electronically signed by Olga Millers MD Signature Date/Time: 10/29/2020/12:11:24 PM    Final         Scheduled Meds:  acetaminophen  1,000 mg Oral Q6H   amLODipine  5 mg Oral Daily   heparin  5,000 Units Subcutaneous Q8H   mouth rinse  15 mL Mouth Rinse BID   metoprolol tartrate  25 mg Oral BID   pantoprazole (PROTONIX) IV  40 mg Intravenous Daily   polycarbophil  625 mg Oral BID   sodium chloride flush  3 mL Intravenous Q12H   sodium chloride flush  3 mL Intravenous Q12H   Continuous Infusions:  sodium chloride 10 mL/hr at 10/29/20 0600   sodium chloride     nitroGLYCERIN 100 mcg/min (10/30/20 0404)     LOS: 3 days    Time spent: 35 minutes    Berton Mount,  MD  [facility] Pager #: [phone] 7PM-7AM contact night coverage as above

## 2020-10-30 NOTE — Evaluation (Signed)
Occupational Therapy Evaluation Patient Details Name: [name] [name] MRN: [id number] DOB: 25-May-1950 Today's Date: 10/30/2020   History of Present Illness 70 y.o. M admitted on 10/19 for a hemorrhoidectomy. After the procedure pt developed respiratory distress and subsequently was found to have an NSTEMI postoperatively. PMH significant of HTN, HLD, DM II, OSA on CPAP, and CAD s/p MI 2008.   Clinical Impression   Pt admitted for concerns listed above. PTA pt reported that he was independent with all ALD's and IADL's, including yard work and driving. At this time, pt continues to demonstrate independence with all ADL's, at times a little slower paced than his normal, however he reports he has no more SOB with activity. Pt feels that he is very near his normal and is motivated to continue moving and getting better. He has no further OT needs at this time and acute OT will sign off.      Recommendations for follow up therapy are one component of a multi-disciplinary discharge planning process, led by the attending physician.  Recommendations may be updated based on patient status, additional functional criteria and insurance authorization.   Follow Up Recommendations  No OT follow up    Equipment Recommendations  None recommended by OT    Recommendations for Other Services       Precautions / Restrictions Precautions Precautions: Other (comment) Precaution Comments: Watch O2 Restrictions Weight Bearing Restrictions: No      Mobility Bed Mobility               General bed mobility comments: Pt in recliner    Transfers Overall transfer level: Modified independent Equipment used: None             General transfer comment: Increased time due to lines    Balance Overall balance assessment: No apparent balance deficits (not formally assessed)                                         ADL either performed or assessed with clinical judgement   ADL  Overall ADL's : Modified independent;At baseline                                       General ADL Comments: Pt able to complete all ADL's with no difficulty, ROM and strength is [facility].     Vision Baseline Vision/History: 1 Wears glasses Ability to See in Adequate Light: 0 Adequate Patient Visual Report: No change from baseline Vision Assessment?: No apparent visual deficits     Perception Perception Perception Tested?: No   Praxis Praxis Praxis tested?: Not tested    Pertinent Vitals/Pain Pain Assessment: No/denies pain     Hand Dominance Right   Extremity/Trunk Assessment Upper Extremity Assessment Upper Extremity Assessment: Overall WFL for tasks assessed   Lower Extremity Assessment Lower Extremity Assessment: Defer to PT evaluation   Cervical / Trunk Assessment Cervical / Trunk Assessment: Normal   Communication Communication Communication: No difficulties   Cognition Arousal/Alertness: Awake/alert Behavior During Therapy: WFL for tasks assessed/performed Overall Cognitive Status: Within Functional Limits for tasks assessed                                     General Comments  On6L  at rest O2 100%, bumped to 3L for 5 mins at rest 98%, On RA at rest 96%, with ambulation overall pt at 91-94%    Exercises     Shoulder Instructions      Home Living Family/patient expects to be discharged to:: Private residence Living Arrangements: Spouse/significant other Available Help at Discharge: Family;Available 24 hours/day Type of Home: House Home Access: Stairs to enter [employer] of Steps: 3 steps   Home Layout: One level     Bathroom Shower/Tub: Tub/shower unit;Walk-in shower   Bathroom Toilet: Standard Bathroom Accessibility: Yes How Accessible: Accessible via walker Home Equipment: Walker - 2 wheels          Prior Functioning/Environment Level of Independence: Independent        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, does a lot of yardwork        OT Problem List: Decreased activity tolerance;Cardiopulmonary status limiting activity      OT Treatment/Interventions:      OT Goals(Current goals can be found in the care plan section) Acute Rehab OT Goals Patient Stated Goal: To go home today OT Goal Formulation: All assessment and education complete, DC therapy Time For Goal Achievement: 10/30/20 Potential to Achieve Goals: Good  OT Frequency:     Barriers to D/C:            Co-evaluation              AM-PAC OT "6 Clicks" Daily Activity     Outcome Measure Help from another person eating meals?: None Help from another person taking care of personal grooming?: None Help from another person toileting, which includes using toliet, bedpan, or urinal?: None Help from another person bathing (including washing, rinsing, drying)?: None Help from another person to put on and taking off regular upper body clothing?: None Help from another person to put on and taking off regular lower body clothing?: None 6 Click Score: 24   End of Session Equipment Utilized During Treatment: Oxygen Nurse Communication: Mobility status  Activity Tolerance: Patient tolerated treatment well Patient left: in chair;with call bell/phone within reach  OT Visit Diagnosis: Muscle weakness (generalized) (M62.81)                Time: 2202-5427 OT Time Calculation (min): 19 min Charges:  OT General Charges $OT Visit: 1 Visit OT Evaluation $OT Eval Low Complexity: 1 Low  [name] [name] H., OTR/L Acute Rehabilitation  [name] [name] [name] [name] 10/30/2020, 1:43 PM

## 2020-10-30 NOTE — Progress Notes (Signed)
1 Day Post-Op   Subjective/Chief Complaint: Complains of rectal soreness-expected   Objective: Vital signs in last 24 hours: Temp:  [97.5 F (36.4 C)-100.2 F (37.9 C)] 97.5 F (36.4 C) (10/22 0413) Pulse Rate:  [69-99] 78 (10/22 0700) Resp:  [10-25] 16 (10/22 0700) BP: (96-181)/(53-84) 135/72 (10/22 0700) SpO2:  [90 %-97 %] 96 % (10/22 0700) Weight:  [104.1 kg] 104.1 kg (10/22 0413) Last BM Date:  (PTA)  Intake/Output from previous day: 10/21 0701 - 10/22 0700 In: 871.7 [P.O.:240; I.V.:631.7] Out: 1140 [Urine:1140] Intake/Output this shift: No intake/output data recorded.  General appearance: alert and cooperative Resp: clear to auscultation bilaterally Cardio: regular rate and rhythm GI: soft, nontender. Perirectal area with mild bruising but otherwise ok. No bleeding  Lab Results:  Recent Labs    10/29/20 1511 10/30/20 0121  WBC 13.1* 12.3*  HGB 13.6 12.1*  HCT 41.0 36.8*  PLT 182 169   BMET Recent Labs    10/29/20 0234 10/29/20 1250 10/29/20 1511 10/30/20 0121  NA 138 141  141  --  137  K 4.1 3.6  3.5  --  3.7  CL 107  --   --  109  CO2 23  --   --  20*  GLUCOSE 111*  --   --  119*  BUN 26*  --   --  18  CREATININE 1.35*  --  1.16 1.15  CALCIUM 8.2*  --   --  8.3*   PT/INR No results for input(s): LABPROT, INR in the last 72 hours. ABG Recent Labs    10/27/20 1453 10/29/20 1250  PHART 7.315* 7.430  7.434  HCO3 20.2 21.9  21.7    Studies/Results: CARDIAC CATHETERIZATION  Result Date: 10/29/2020 CONCLUSIONS Widely patent left main Segmental mid 40 to 60% LAD.  First diagonal 60% ostial. Circumflex is relatively small and without any significant disease. Dominant right coronary.  Widely patent mid RCA stent.  Ectatic RCA beyond stent with 30% narrowing. Mid anterior wall severe hypokinesis.  LVEF 40 to 50%.  LVEDP 15 mmHg. Normal right heart pressures Hypoxia despite high flow oxygen, uncertain etiology. 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 Resume beta blocker. Pulmonary embolism has not been excluded but seems less likely. Will notify pulmonary medicine the patient will be kept at [facility] and that there continued management is needed.   DG Chest Port 1 View  Result Date: 10/29/2020 CLINICAL DATA:  Respiratory failure EXAM: PORTABLE CHEST 1 VIEW COMPARISON:  Radiograph 10/28/2020 FINDINGS: Unchanged cardiomediastinal silhouette. There are increased perihilar and right upper lobe opacities in comparison to prior exam. Trace left pleural effusion. No visible pneumothorax. No acute osseous abnormality. IMPRESSION: Mild pulmonary edema, increased from prior exam. Electronically Signed   By: Caprice Renshaw M.D.   On: 10/29/2020 08:27   DG CHEST PORT 1 VIEW  Result Date: 10/28/2020 CLINICAL DATA:  Chest pain. EXAM: PORTABLE CHEST 1 VIEW COMPARISON:  Same day. FINDINGS: The heart size and mediastinal contours are within normal limits. Both lungs are clear. The visualized skeletal structures are unremarkable. IMPRESSION: No active disease. Electronically Signed   By: Lupita Raider M.D.   On: 10/28/2020 16:11   ECHOCARDIOGRAM LIMITED  Result Date: 10/29/2020    ECHOCARDIOGRAM LIMITED REPORT   Patient Name:   [name] [name] Date of Exam: 10/29/2020 Medical Rec #:  [id number]       Height:       72.0 in Accession #:  [id number]      Weight:       234.3 lb Date of Birth:  [birth year]        BSA:          2.279 m Patient Age:    70 years        BP:           157/65 mmHg Patient Gender: M               HR:           60 bpm. Exam Location:  Inpatient Procedure: Limited Echo, Limited Color Doppler and Cardiac Doppler Indications:    R07.9* Chest pain, unspecified  History:        Patient has prior history of Echocardiogram examinations, most                 recent 06/14/2020. CAD and Previous Myocardial Infarction; Risk                 Factors:Diabetes, Dyslipidemia,  Hypertension and Sleep Apnea.  Sonographer:    Eulah Pont RDCS Referring Phys: [phone] HAO MENG IMPRESSIONS  1. Left ventricular ejection fraction, by estimation, is 55 to 60%. The left ventricle has normal function. The left ventricle has no regional wall motion abnormalities. The left ventricular internal cavity size was mildly dilated. Left ventricular diastolic parameters are consistent with Grade I diastolic dysfunction (impaired relaxation).  2. Right ventricular systolic function is normal. The right ventricular size is normal.  3. The mitral valve is normal in structure. Trivial mitral valve regurgitation. No evidence of mitral stenosis.  4. The aortic valve is tricuspid. Aortic valve regurgitation is not visualized. No aortic stenosis is present.  5. Aortic dilatation noted. There is borderline dilatation of the ascending aorta, measuring 39 mm.  6. The inferior vena cava is normal in size with greater than 50% respiratory variability, suggesting right atrial pressure of 3 mmHg. FINDINGS  Left Ventricle: Left ventricular ejection fraction, by estimation, is 55 to 60%. The left ventricle has normal function. The left ventricle has no regional wall motion abnormalities. The left ventricular internal cavity size was mildly dilated. There is  no left ventricular hypertrophy. Left ventricular diastolic parameters are consistent with Grade I diastolic dysfunction (impaired relaxation). Right Ventricle: The right ventricular size is normal. Right ventricular systolic function is normal. Left Atrium: Left atrial size was normal in size. Right Atrium: Right atrial size was normal in size. Pericardium: There is no evidence of pericardial effusion. Mitral Valve: The mitral valve is normal in structure. Trivial mitral valve regurgitation. No evidence of mitral valve stenosis. Tricuspid Valve: The tricuspid valve is normal in structure. Tricuspid valve regurgitation is trivial. No evidence of tricuspid stenosis.  Aortic Valve: The aortic valve is tricuspid. Aortic valve regurgitation is not visualized. No aortic stenosis is present. Pulmonic Valve: The pulmonic valve was not well visualized. Pulmonic valve regurgitation is not visualized. No evidence of pulmonic stenosis. Aorta: Aortic dilatation noted. There is borderline dilatation of the ascending aorta, measuring 39 mm. Venous: The inferior vena cava is normal in size with greater than 50% respiratory variability, suggesting right atrial pressure of 3 mmHg. IAS/Shunts: No atrial level shunt detected by color flow Doppler. LEFT VENTRICLE PLAX 2D LVIDd:         5.60 cm      Diastology LVIDs:         4.40 cm      LV e' medial:    7.50 cm/s  LV PW:         1.00 cm      LV E/e' medial:  12.7 LV IVS:        0.90 cm      LV e' lateral:   7.53 cm/s LVOT diam:     2.20 cm      LV E/e' lateral: 12.6 LV SV:         72 LV SV Index:   32 LVOT Area:     3.80 cm  LV Volumes (MOD) LV vol d, MOD A2C: 146.0 ml LV vol d, MOD A4C: 164.0 ml LV vol s, MOD A2C: 75.5 ml LV vol s, MOD A4C: 79.0 ml LV SV MOD A2C:     70.5 ml LV SV MOD A4C:     164.0 ml LV SV MOD BP:      78.2 ml RIGHT VENTRICLE RV S prime:     9.73 cm/s TAPSE (M-mode): 2.6 cm LEFT ATRIUM         Index LA diam:    4.40 cm 1.93 cm/m  AORTIC VALVE LVOT Vmax:   108.00 cm/s LVOT Vmean:  68.400 cm/s LVOT VTI:    0.189 m  AORTA Ao Root diam: 3.80 cm Ao Asc diam:  3.90 cm MITRAL VALVE MV Area (PHT): 4.21 cm     SHUNTS MV Decel Time: 180 msec     Systemic VTI:  0.19 m MV E velocity: 95.10 cm/s   Systemic Diam: 2.20 cm MV A velocity: 110.00 cm/s MV E/A ratio:  0.86 Olga Millers MD Electronically signed by Olga Millers MD Signature Date/Time: 10/29/2020/12:11:24 PM    Final     Anti-infectives: Anti-infectives (From admission, onward)    Start     Dose/Rate Route Frequency Ordered Stop   10/28/20 0600  cefTRIAXone (ROCEPHIN) 2 g in sodium chloride 0.9 % 100 mL IVPB  Status:  Discontinued       See Hyperspace for full Linked  Orders Report.   2 g 200 mL/hr over 30 Minutes Intravenous On call to O.R. 10/27/20 1237 10/29/20 0559   10/28/20 0600  metroNIDAZOLE (FLAGYL) IVPB 500 mg  Status:  Discontinued       See Hyperspace for full Linked Orders Report.   500 mg 100 mL/hr over 60 Minutes Intravenous On call to O.R. 10/27/20 1237 10/29/20 0559       Assessment/Plan: s/p Procedure(s): LEFT HEART CATH AND CORONARY ANGIOGRAPHY (N/A) RIGHT HEART CATH (N/A) Advance diet. Soft foods Warm tub soaks several times a day and after bm's Clean with baby wipes Use colace and miralax to avoid constipation Ok for d/c from surgical standpoint  LOS: 3 days    [name] [name] 10/30/2020

## 2020-10-30 NOTE — Progress Notes (Signed)
Progress Note  Patient Name: [name] [name] Date of Encounter: 10/30/2020  [facility] Cardiologist: Rollene Rotunda, MD    Subjective   No acute events overnight.  Had left heart catheterization yesterday which showed no severe obstructive coronary artery disease.  Inpatient Medications    Scheduled Meds:  acetaminophen  1,000 mg Oral Q6H   amLODipine  5 mg Oral Daily   heparin  5,000 Units Subcutaneous Q8H   mouth rinse  15 mL Mouth Rinse BID   metoprolol tartrate  25 mg Oral BID   pantoprazole (PROTONIX) IV  40 mg Intravenous Daily   polycarbophil  625 mg Oral BID   sodium chloride flush  3 mL Intravenous Q12H   sodium chloride flush  3 mL Intravenous Q12H   Continuous Infusions:  sodium chloride 10 mL/hr at 10/29/20 0600   sodium chloride     nitroGLYCERIN 100 mcg/min (10/30/20 0404)   potassium PHOSPHATE IVPB (in mmol)     PRN Meds: sodium chloride, sodium chloride, acetaminophen, albuterol, fentaNYL (SUBLIMAZE) injection, fentaNYL (SUBLIMAZE) injection, hydrocortisone-pramoxine, metoprolol tartrate, ondansetron (ZOFRAN) IV, oxyCODONE, oxyCODONE, sodium chloride flush   Vital Signs    Vitals:   10/30/20 0036 10/30/20 0332 10/30/20 0413 10/30/20 0700  BP: 114/69 112/62  135/72  Pulse: 73 69  78  Resp:    16  Temp:  (!) 97.5 F (36.4 C) (!) 97.5 F (36.4 C)   TempSrc:   Oral   SpO2: 95% 96%  96%  Weight:   104.1 kg   Height:        Intake/Output Summary (Last 24 hours) at 10/30/2020 1015 Last data filed at 10/30/2020 0531 Gross per 24 hour  Intake 871.72 ml  Output 1140 ml  Net -268.28 ml    Last 3 Weights 10/30/2020 10/29/2020 10/28/2020  Weight (lbs) 229 lb 9.6 oz 234 lb 5.6 oz 238 lb 5.1 oz  Weight (kg) 104.146 kg 106.3 kg 108.1 kg      Telemetry  NSR - Personally Reviewed  ECG     Personally Reviewed  Physical Exam   GEN: middle age male in no acute distress Neck: No JVD Cardiac: RRR, no murmurs, rubs, or gallops.  Respiratory:  few scattered wheezes  GI: Soft, nontender, non-distended  MS: No edema; No deformity. Neuro:  Nonfocal  Psych: Normal affect   Labs    High Sensitivity Troponin:   Recent Labs  Lab 10/28/20 1025 10/28/20 1302 10/28/20 1654 10/28/20 1932 10/30/20 0454  TROPONINIHS 1,685* 3,005* 2,726* 2,241* 561*      Chemistry Recent Labs  Lab 10/27/20 1303 10/27/20 2125 10/28/20 0236 10/29/20 0234 10/29/20 1250 10/29/20 1511 10/30/20 0121  NA 137  --  139 138 141  141  --  137  K 5.4*   < > 4.5 4.1 3.6  3.5  --  3.7  CL 105  --  109 107  --   --  109  CO2 22  --  18* 23  --   --  20*  GLUCOSE 232*  --  121* 111*  --   --  119*  BUN 22  --  25* 26*  --   --  18  CREATININE 1.39*  --  1.48* 1.35*  --  1.16 1.15  CALCIUM 8.5*  --  8.5* 8.2*  --   --  8.3*  MG 2.4  --   --  2.0  --   --   --   PROT  --   --   --   --   --   --  6.0*  ALBUMIN 3.8  --   --   --   --   --  3.1*  AST  --   --   --   --   --   --  38  ALT  --   --   --   --   --   --  19  ALKPHOS  --   --   --   --   --   --  70  BILITOT  --   --   --   --   --   --  1.6*  GFRNONAA 55*  --  51* 57*  --  >60 >60  ANIONGAP 10  --  12 8  --   --  8   < > = values in this interval not displayed.     Lipids No results for input(s): CHOL, TRIG, HDL, LABVLDL, LDLCALC, CHOLHDL in the last 168 hours.  Hematology Recent Labs  Lab 10/29/20 0234 10/29/20 1250 10/29/20 1511 10/30/20 0121  WBC 15.1*  --  13.1* 12.3*  RBC 4.71  --  4.56 4.14*  HGB 13.8 12.9*  12.9* 13.6 12.1*  HCT 43.4 38.0*  38.0* 41.0 36.8*  MCV 92.1  --  89.9 88.9  MCH 29.3  --  29.8 29.2  MCHC 31.8  --  33.2 32.9  RDW 15.9*  --  15.7* 15.8*  PLT 170  --  182 169    Thyroid No results for input(s): TSH, FREET4 in the last 168 hours.  BNP Recent Labs  Lab 10/27/20 1303  BNP 56.3     DDimer No results for input(s): DDIMER in the last 168 hours.   Radiology    CARDIAC CATHETERIZATION  Result Date: 10/29/2020 CONCLUSIONS Widely patent  left main Segmental mid 40 to 60% LAD.  First diagonal 60% ostial. Circumflex is relatively small and without any significant disease. Dominant right coronary.  Widely patent mid RCA stent.  Ectatic RCA beyond stent with 30% narrowing. Mid anterior wall severe hypokinesis.  LVEF 40 to 50%.  LVEDP 15 mmHg. Normal right heart pressures Hypoxia despite high flow oxygen, uncertain etiology. 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 Resume beta blocker. Pulmonary embolism has not been excluded but seems less likely. Will notify pulmonary medicine the patient will be kept at [facility] and that there continued management is needed.   DG Chest Port 1 View  Result Date: 10/29/2020 CLINICAL DATA:  Respiratory failure EXAM: PORTABLE CHEST 1 VIEW COMPARISON:  Radiograph 10/28/2020 FINDINGS: Unchanged cardiomediastinal silhouette. There are increased perihilar and right upper lobe opacities in comparison to prior exam. Trace left pleural effusion. No visible pneumothorax. No acute osseous abnormality. IMPRESSION: Mild pulmonary edema, increased from prior exam. Electronically Signed   By: Caprice Renshaw M.D.   On: 10/29/2020 08:27   DG CHEST PORT 1 VIEW  Result Date: 10/28/2020 CLINICAL DATA:  Chest pain. EXAM: PORTABLE CHEST 1 VIEW COMPARISON:  Same day. FINDINGS: The heart size and mediastinal contours are within normal limits. Both lungs are clear. The visualized skeletal structures are unremarkable. IMPRESSION: No active disease. Electronically Signed   By: Lupita Raider M.D.   On: 10/28/2020 16:11   ECHOCARDIOGRAM LIMITED  Result Date: 10/29/2020    ECHOCARDIOGRAM LIMITED REPORT   Patient Name:   [name] [name] Stocking Date of Exam: 10/29/2020 Medical Rec #:  [id number]       Height:       72.0 in  Accession #:    [id number]      Weight:       234.3 lb Date of Birth:  Feb 18, 1950        BSA:          2.279 m Patient Age:    70 years        BP:            157/65 mmHg Patient Gender: M               HR:           60 bpm. Exam Location:  Inpatient Procedure: Limited Echo, Limited Color Doppler and Cardiac Doppler Indications:    R07.9* Chest pain, unspecified  History:        Patient has prior history of Echocardiogram examinations, most                 recent 06/14/2020. CAD and Previous Myocardial Infarction; Risk                 Factors:Diabetes, Dyslipidemia, Hypertension and Sleep Apnea.  Sonographer:    Eulah Pont RDCS Referring Phys: [phone] HAO MENG IMPRESSIONS  1. Left ventricular ejection fraction, by estimation, is 55 to 60%. The left ventricle has normal function. The left ventricle has no regional wall motion abnormalities. The left ventricular internal cavity size was mildly dilated. Left ventricular diastolic parameters are consistent with Grade I diastolic dysfunction (impaired relaxation).  2. Right ventricular systolic function is normal. The right ventricular size is normal.  3. The mitral valve is normal in structure. Trivial mitral valve regurgitation. No evidence of mitral stenosis.  4. The aortic valve is tricuspid. Aortic valve regurgitation is not visualized. No aortic stenosis is present.  5. Aortic dilatation noted. There is borderline dilatation of the ascending aorta, measuring 39 mm.  6. The inferior vena cava is normal in size with greater than 50% respiratory variability, suggesting right atrial pressure of 3 mmHg. FINDINGS  Left Ventricle: Left ventricular ejection fraction, by estimation, is 55 to 60%. The left ventricle has normal function. The left ventricle has no regional wall motion abnormalities. The left ventricular internal cavity size was mildly dilated. There is  no left ventricular hypertrophy. Left ventricular diastolic parameters are consistent with Grade I diastolic dysfunction (impaired relaxation). Right Ventricle: The right ventricular size is normal. Right ventricular systolic function is normal. Left Atrium: Left  atrial size was normal in size. Right Atrium: Right atrial size was normal in size. Pericardium: There is no evidence of pericardial effusion. Mitral Valve: The mitral valve is normal in structure. Trivial mitral valve regurgitation. No evidence of mitral valve stenosis. Tricuspid Valve: The tricuspid valve is normal in structure. Tricuspid valve regurgitation is trivial. No evidence of tricuspid stenosis. Aortic Valve: The aortic valve is tricuspid. Aortic valve regurgitation is not visualized. No aortic stenosis is present. Pulmonic Valve: The pulmonic valve was not well visualized. Pulmonic valve regurgitation is not visualized. No evidence of pulmonic stenosis. Aorta: Aortic dilatation noted. There is borderline dilatation of the ascending aorta, measuring 39 mm. Venous: The inferior vena cava is normal in size with greater than 50% respiratory variability, suggesting right atrial pressure of 3 mmHg. IAS/Shunts: No atrial level shunt detected by color flow Doppler. LEFT VENTRICLE PLAX 2D LVIDd:         5.60 cm      Diastology LVIDs:         4.40 cm      LV e' medial:  7.50 cm/s LV PW:         1.00 cm      LV E/e' medial:  12.7 LV IVS:        0.90 cm      LV e' lateral:   7.53 cm/s LVOT diam:     2.20 cm      LV E/e' lateral: 12.6 LV SV:         72 LV SV Index:   32 LVOT Area:     3.80 cm  LV Volumes (MOD) LV vol d, MOD A2C: 146.0 ml LV vol d, MOD A4C: 164.0 ml LV vol s, MOD A2C: 75.5 ml LV vol s, MOD A4C: 79.0 ml LV SV MOD A2C:     70.5 ml LV SV MOD A4C:     164.0 ml LV SV MOD BP:      78.2 ml RIGHT VENTRICLE RV S prime:     9.73 cm/s TAPSE (M-mode): 2.6 cm LEFT ATRIUM         Index LA diam:    4.40 cm 1.93 cm/m  AORTIC VALVE LVOT Vmax:   108.00 cm/s LVOT Vmean:  68.400 cm/s LVOT VTI:    0.189 m  AORTA Ao Root diam: 3.80 cm Ao Asc diam:  3.90 cm MITRAL VALVE MV Area (PHT): 4.21 cm     SHUNTS MV Decel Time: 180 msec     Systemic VTI:  0.19 m MV E velocity: 95.10 cm/s   Systemic Diam: 2.20 cm MV A velocity:  110.00 cm/s MV E/A ratio:  0.86 Olga Millers MD Electronically signed by Olga Millers MD Signature Date/Time: 10/29/2020/12:11:24 PM    Final     Cardiac Studies   October 29, 2020 left heart catheterization CONCLUSIONS Widely patent left main Segmental mid 40 to 60% LAD.  First diagonal 60% ostial. Circumflex is relatively small and without any significant disease. Dominant right coronary.  Widely patent mid RCA stent.  Ectatic RCA beyond stent with 30% narrowing. Mid anterior wall severe hypokinesis.  LVEF 40 to 50%.  LVEDP 15 mmHg. Normal right heart pressures Hypoxia despite high flow oxygen, uncertain etiology.  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 Resume beta blocker. Pulmonary embolism has not been excluded but seems less likely. Will notify pulmonary medicine the patient will be kept at [facility] and that there continued management is needed.  Patient Profile     70 y.o. male    Assessment & Plan     1.  Acute coronary syndrome:  Troponin elevation could be secondary to atypical stress-induced cardiomyopathy given left heart catheterization findings from yesterday.  Troponins have since downtrended Continue metoprolol Add atorvastatin today Will reach out to surgical team re: starting aspirin 81 in setting of CAD  2.  Hyperlipidemia  3.  Hypertension Controlled       For questions or updates, please contact [facility] Please consult [email or web address] for contact info under        Signed, Lanier Prude, MD  10/30/2020, 10:15 AM

## 2020-10-30 NOTE — Evaluation (Signed)
Physical Therapy Evaluation Patient Details Name: [name] [name] MRN: [id number] DOB: 1950-10-08 Today's Date: 10/30/2020  History of Present Illness  70 y.o. M admitted on 10/19 for a hemorrhoidectomy. After the procedure pt developed respiratory distress and subsequently was found to have an NSTEMI postoperatively. PMH significant of HTN, HLD, DM II, OSA on CPAP, and CAD s/p MI 2008.  Clinical Impression  Pt doing well with mobility and no further PT needed.  Ready for dc from PT standpoint. Instructed pt to amb in halls with wife throughout rest of stay. Pt's SpO2 93% or greater on RA with amb.         Recommendations for follow up therapy are one component of a multi-disciplinary discharge planning process, led by the attending physician.  Recommendations may be updated based on patient status, additional functional criteria and insurance authorization.  Follow Up Recommendations No PT follow up    Equipment Recommendations  None recommended by PT    Recommendations for Other Services       Precautions / Restrictions Precautions Precautions: None Precaution Comments: Watch O2 Restrictions Weight Bearing Restrictions: No      Mobility  Bed Mobility               General bed mobility comments: Pt in recliner    Transfers Overall transfer level: Modified independent Equipment used: None             General transfer comment: slower to rise due to soreness  Ambulation/Gait Ambulation/Gait assistance: Supervision Gait Distance (Feet): 450 Feet Assistive device: None Gait Pattern/deviations: Step-through pattern;Decreased stride length Gait velocity: adequate Gait velocity interpretation: >2.62 ft/sec, indicative of community ambulatory General Gait Details: supervision for lines.  Stairs            Wheelchair Mobility    Modified Rankin (Stroke Patients Only)       Balance Overall balance assessment: No apparent balance deficits (not  formally assessed)                                           Pertinent Vitals/Pain Pain Assessment: Faces Faces Pain Scale: Hurts a little bit Pain Location: buttocks Pain Descriptors / Indicators: Sore Pain Intervention(s): Limited activity within patient's tolerance    Home Living Family/patient expects to be discharged to:: Private residence Living Arrangements: Spouse/significant other Available Help at Discharge: Family;Available 24 hours/day Type of Home: House Home Access: Stairs to enter   [employer] of Steps: 3 steps Home Layout: One level Home Equipment: Walker - 2 wheels      Prior Function Level of Independence: Independent         Comments[place], does a lot of yardwork     Hand Dominance   Dominant Hand: Right    Extremity/Trunk Assessment   Upper Extremity Assessment Upper Extremity Assessment: Defer to OT evaluation    Lower Extremity Assessment Lower Extremity Assessment: Overall WFL for tasks assessed    Cervical / Trunk Assessment Cervical / Trunk Assessment: Normal  Communication   Communication: No difficulties  Cognition Arousal/Alertness: Awake/alert Behavior During Therapy: WFL for tasks assessed/performed Overall Cognitive Status: Within Functional Limits for tasks assessed  General Comments General comments (skin integrity, edema, etc.): On6L at rest O2 100%, bumped to 3L for 5 mins at rest 98%, On RA at rest 96%, with ambulation overall pt at 91-94%    Exercises     Assessment/Plan    PT Assessment Patent does not need any further PT services  PT Problem List         PT Treatment Interventions      PT Goals (Current goals can be found in the Care Plan section)  Acute Rehab PT Goals Patient Stated Goal: To go home today PT Goal Formulation: All assessment and education complete, DC therapy    Frequency     Barriers to  discharge        Co-evaluation               AM-PAC PT "6 Clicks" Mobility  Outcome Measure Help needed turning from your back to your side while in a flat bed without using bedrails?: None Help needed moving from lying on your back to sitting on the side of a flat bed without using bedrails?: None Help needed moving to and from a bed to a chair (including a wheelchair)?: None Help needed standing up from a chair using your arms (e.g., wheelchair or bedside chair)?: None Help needed to walk in hospital room?: None Help needed climbing 3-5 steps with a railing? : None 6 Click Score: 24    End of Session   Activity Tolerance: Patient tolerated treatment well Patient left: in chair;with call bell/phone within reach;with family/visitor present Nurse Communication: Mobility status PT Visit Diagnosis: Other abnormalities of gait and mobility (R26.89)    Time: 3557-3220 PT Time Calculation (min) (ACUTE ONLY): 16 min   Charges:   PT Evaluation $PT Eval Low Complexity: 1 Low          [facility] PT Acute Rehabilitation Services Pager [phone] Office [phone]   Angelina Ok [facility] 10/30/2020, 2:33 PM

## 2020-10-31 DIAGNOSIS — I25119 Atherosclerotic heart disease of native coronary artery with unspecified angina pectoris: Secondary | ICD-10-CM | POA: Diagnosis not present

## 2020-10-31 DIAGNOSIS — I214 Non-ST elevation (NSTEMI) myocardial infarction: Secondary | ICD-10-CM | POA: Diagnosis not present

## 2020-10-31 LAB — MAGNESIUM: Magnesium: 2.1 mg/dL (ref 1.7–2.4)

## 2020-10-31 LAB — RENAL FUNCTION PANEL
Albumin: 3 g/dL — ABNORMAL LOW (ref 3.5–5.0)
Anion gap: 7 (ref 5–15)
BUN: 18 mg/dL (ref 8–23)
CO2: 22 mmol/L (ref 22–32)
Calcium: 8.3 mg/dL — ABNORMAL LOW (ref 8.9–10.3)
Chloride: 107 mmol/L (ref 98–111)
Creatinine, Ser: 1.09 mg/dL (ref 0.61–1.24)
GFR, Estimated: >60 mL/min (ref >60)
Glucose, Bld: 109 mg/dL — ABNORMAL HIGH (ref 70–99)
Phosphorus: 2.6 mg/dL (ref 2.5–4.6)
Potassium: 3.5 mmol/L (ref 3.5–5.1)
Sodium: 136 mmol/L (ref 135–145)

## 2020-10-31 LAB — CBC WITH DIFFERENTIAL/PLATELET
Abs Immature Granulocytes: 0.04 10*3/uL (ref 0.00–0.07)
Basophils Absolute: 0 10*3/uL (ref 0.0–0.1)
Basophils Relative: 0 %
Eosinophils Absolute: 0.2 10*3/uL (ref 0.0–0.5)
Eosinophils Relative: 2 %
HCT: 36.8 % — ABNORMAL LOW (ref 39.0–52.0)
Hemoglobin: 12.2 g/dL — ABNORMAL LOW (ref 13.0–17.0)
Immature Granulocytes: 0 %
Lymphocytes Relative: 21 %
Lymphs Abs: 2 10*3/uL (ref 0.7–4.0)
MCH: 29.3 pg (ref 26.0–34.0)
MCHC: 33.2 g/dL (ref 30.0–36.0)
MCV: 88.5 fL (ref 80.0–100.0)
Monocytes Absolute: 1 10*3/uL (ref 0.1–1.0)
Monocytes Relative: 11 %
Neutro Abs: 6.2 10*3/uL (ref 1.7–7.7)
Neutrophils Relative %: 66 %
Platelets: 164 10*3/uL (ref 150–400)
RBC: 4.16 MIL/uL — ABNORMAL LOW (ref 4.22–5.81)
RDW: 15.9 % — ABNORMAL HIGH (ref 11.5–15.5)
WBC: 9.4 10*3/uL (ref 4.0–10.5)
nRBC: 0 % (ref 0.0–0.2)

## 2020-10-31 LAB — GLUCOSE, CAPILLARY
Glucose-Capillary: 112 mg/dL — ABNORMAL HIGH (ref 70–99)
Glucose-Capillary: 97 mg/dL (ref 70–99)

## 2020-10-31 MED ORDER — AMLODIPINE BESYLATE 5 MG PO TABS: 5.0000 mg | ORAL_TABLET | Freq: Every day | ORAL | 0 refills | Status: AC

## 2020-10-31 MED ORDER — POTASSIUM CHLORIDE CRYS ER 20 MEQ PO TBCR
40.0000 meq | EXTENDED_RELEASE_TABLET | Freq: Once | ORAL | Status: AC
  Administered 2020-10-31: 40 meq via ORAL
  Filled 2020-10-31: qty 2

## 2020-10-31 NOTE — Progress Notes (Signed)
Progress Note  Patient Name: [name] [name] Date of Encounter: 10/31/2020  [facility] Cardiologist: Rollene Rotunda, MD    Subjective  NAEO.  Inpatient Medications    Scheduled Meds:  acetaminophen  1,000 mg Oral Q6H   amLODipine  5 mg Oral Daily   atorvastatin  40 mg Oral Daily   heparin  5,000 Units Subcutaneous Q8H   mouth rinse  15 mL Mouth Rinse BID   metoprolol tartrate  25 mg Oral BID   pantoprazole (PROTONIX) IV  40 mg Intravenous Daily   polycarbophil  625 mg Oral BID   sodium chloride flush  3 mL Intravenous Q12H   sodium chloride flush  3 mL Intravenous Q12H   Continuous Infusions:  sodium chloride 10 mL/hr at 10/29/20 0600   sodium chloride     nitroGLYCERIN Stopped (10/30/20 1347)   PRN Meds: sodium chloride, sodium chloride, acetaminophen, albuterol, fentaNYL (SUBLIMAZE) injection, fentaNYL (SUBLIMAZE) injection, hydrocortisone-pramoxine, metoprolol tartrate, ondansetron (ZOFRAN) IV, oxyCODONE, oxyCODONE, sodium chloride flush   Vital Signs    Vitals:   10/30/20 2312 10/30/20 2318 10/31/20 0454 10/31/20 0500  BP: (!) 164/80  (!) 141/71   Pulse: 72  64   Resp:   19   Temp: 97.9 F (36.6 C)  98.2 F (36.8 C)   TempSrc: Oral  Oral   SpO2: 95% 92% 97%   Weight:    104.4 kg  Height:        Intake/Output Summary (Last 24 hours) at 10/31/2020 0908 Last data filed at 10/30/2020 2300 Gross per 24 hour  Intake 3 ml  Output 400 ml  Net -397 ml    Last 3 Weights 10/31/2020 10/30/2020 10/29/2020  Weight (lbs) 230 lb 1.6 oz 229 lb 9.6 oz 234 lb 5.6 oz  Weight (kg) 104.373 kg 104.146 kg 106.3 kg      Telemetry  NSR - Personally Reviewed  ECG     Personally Reviewed  Physical Exam   GEN: middle age male in no acute distress Neck: No JVD Cardiac: RRR, no murmurs, rubs, or gallops.  Respiratory: few scattered wheezes  GI: Soft, nontender, non-distended  MS: No edema; No deformity. Neuro:  Nonfocal  Psych: Normal affect   Labs     High Sensitivity Troponin:   Recent Labs  Lab 10/28/20 1025 10/28/20 1302 10/28/20 1654 10/28/20 1932 10/30/20 0454  TROPONINIHS 1,685* 3,005* 2,726* 2,241* 561*      Chemistry Recent Labs  Lab 10/27/20 1303 10/27/20 2125 10/29/20 0234 10/29/20 1250 10/29/20 1511 10/30/20 0121 10/31/20 0205  NA 137   < > 138 141  141  --  137 136  K 5.4*   < > 4.1 3.6  3.5  --  3.7 3.5  CL 105   < > 107  --   --  109 107  CO2 22   < > 23  --   --  20* 22  GLUCOSE 232*   < > 111*  --   --  119* 109*  BUN 22   < > 26*  --   --  18 18  CREATININE 1.39*   < > 1.35*  --  1.16 1.15 1.09  CALCIUM 8.5*   < > 8.2*  --   --  8.3* 8.3*  MG 2.4  --  2.0  --   --   --  2.1  PROT  --   --   --   --   --  6.0*  --  ALBUMIN 3.8  --   --   --   --  3.1* 3.0*  AST  --   --   --   --   --  38  --   ALT  --   --   --   --   --  19  --   ALKPHOS  --   --   --   --   --  70  --   BILITOT  --   --   --   --   --  1.6*  --   GFRNONAA 55*   < > 57*  --  >60 >60 >60  ANIONGAP 10   < > 8  --   --  8 7   < > = values in this interval not displayed.     Lipids No results for input(s): CHOL, TRIG, HDL, LABVLDL, LDLCALC, CHOLHDL in the last 168 hours.  Hematology Recent Labs  Lab 10/29/20 1511 10/30/20 0121 10/31/20 0205  WBC 13.1* 12.3* 9.4  RBC 4.56 4.14* 4.16*  HGB 13.6 12.1* 12.2*  HCT 41.0 36.8* 36.8*  MCV 89.9 88.9 88.5  MCH 29.8 29.2 29.3  MCHC 33.2 32.9 33.2  RDW 15.7* 15.8* 15.9*  PLT 182 169 164    Thyroid No results for input(s): TSH, FREET4 in the last 168 hours.  BNP Recent Labs  Lab 10/27/20 1303  BNP 56.3     DDimer No results for input(s): DDIMER in the last 168 hours.   Radiology    CARDIAC CATHETERIZATION  Result Date: 10/29/2020 CONCLUSIONS Widely patent left main Segmental mid 40 to 60% LAD.  First diagonal 60% ostial. Circumflex is relatively small and without any significant disease. Dominant right coronary.  Widely patent mid RCA stent.  Ectatic RCA beyond stent  with 30% narrowing. Mid anterior wall severe hypokinesis.  LVEF 40 to 50%.  LVEDP 15 mmHg. Normal right heart pressures Hypoxia despite high flow oxygen, uncertain etiology. 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 Resume beta blocker. Pulmonary embolism has not been excluded but seems less likely. Will notify pulmonary medicine the patient will be kept at [facility] and that there continued management is needed.   DG CHEST PORT 1 VIEW  Result Date: 10/30/2020 CLINICAL DATA:  Follow-up pulmonary edema EXAM: PORTABLE CHEST 1 VIEW COMPARISON:  10/29/2020 FINDINGS: Heart size is normal. Mediastinal shadows are normal. The lungs are clear. There may be mild pulmonary venous hypertension but there is no frank edema. No visible effusion. No bone abnormality. IMPRESSION: No definite active process. Possible mild venous hypertension but no frank edema. Electronically Signed   By: Paulina Fusi M.D.   On: 10/30/2020 11:14   ECHOCARDIOGRAM LIMITED  Result Date: 10/29/2020    ECHOCARDIOGRAM LIMITED REPORT   Patient Name:   [name] [name] Date of Exam: 10/29/2020 Medical Rec #:  [id number]       Height:       72.0 in Accession #:    [id number]      Weight:       234.3 lb Date of Birth:  1950/12/25        BSA:          2.279 m Patient Age:    70 years        BP:           157/65 mmHg Patient Gender: M  HR:           60 bpm. Exam Location:  Inpatient Procedure: Limited Echo, Limited Color Doppler and Cardiac Doppler Indications:    R07.9* Chest pain, unspecified  History:        Patient has prior history of Echocardiogram examinations, most                 recent 06/14/2020. CAD and Previous Myocardial Infarction; Risk                 Factors:Diabetes, Dyslipidemia, Hypertension and Sleep Apnea.  Sonographer:    Eulah Pont RDCS Referring Phys: [phone] HAO MENG IMPRESSIONS  1. Left ventricular ejection fraction, by estimation, is 55 to 60%. The  left ventricle has normal function. The left ventricle has no regional wall motion abnormalities. The left ventricular internal cavity size was mildly dilated. Left ventricular diastolic parameters are consistent with Grade I diastolic dysfunction (impaired relaxation).  2. Right ventricular systolic function is normal. The right ventricular size is normal.  3. The mitral valve is normal in structure. Trivial mitral valve regurgitation. No evidence of mitral stenosis.  4. The aortic valve is tricuspid. Aortic valve regurgitation is not visualized. No aortic stenosis is present.  5. Aortic dilatation noted. There is borderline dilatation of the ascending aorta, measuring 39 mm.  6. The inferior vena cava is normal in size with greater than 50% respiratory variability, suggesting right atrial pressure of 3 mmHg. FINDINGS  Left Ventricle: Left ventricular ejection fraction, by estimation, is 55 to 60%. The left ventricle has normal function. The left ventricle has no regional wall motion abnormalities. The left ventricular internal cavity size was mildly dilated. There is  no left ventricular hypertrophy. Left ventricular diastolic parameters are consistent with Grade I diastolic dysfunction (impaired relaxation). Right Ventricle: The right ventricular size is normal. Right ventricular systolic function is normal. Left Atrium: Left atrial size was normal in size. Right Atrium: Right atrial size was normal in size. Pericardium: There is no evidence of pericardial effusion. Mitral Valve: The mitral valve is normal in structure. Trivial mitral valve regurgitation. No evidence of mitral valve stenosis. Tricuspid Valve: The tricuspid valve is normal in structure. Tricuspid valve regurgitation is trivial. No evidence of tricuspid stenosis. Aortic Valve: The aortic valve is tricuspid. Aortic valve regurgitation is not visualized. No aortic stenosis is present. Pulmonic Valve: The pulmonic valve was not well visualized.  Pulmonic valve regurgitation is not visualized. No evidence of pulmonic stenosis. Aorta: Aortic dilatation noted. There is borderline dilatation of the ascending aorta, measuring 39 mm. Venous: The inferior vena cava is normal in size with greater than 50% respiratory variability, suggesting right atrial pressure of 3 mmHg. IAS/Shunts: No atrial level shunt detected by color flow Doppler. LEFT VENTRICLE PLAX 2D LVIDd:         5.60 cm      Diastology LVIDs:         4.40 cm      LV e' medial:    7.50 cm/s LV PW:         1.00 cm      LV E/e' medial:  12.7 LV IVS:        0.90 cm      LV e' lateral:   7.53 cm/s LVOT diam:     2.20 cm      LV E/e' lateral: 12.6 LV SV:         72 LV SV Index:   32 LVOT Area:  3.80 cm  LV Volumes (MOD) LV vol d, MOD A2C: 146.0 ml LV vol d, MOD A4C: 164.0 ml LV vol s, MOD A2C: 75.5 ml LV vol s, MOD A4C: 79.0 ml LV SV MOD A2C:     70.5 ml LV SV MOD A4C:     164.0 ml LV SV MOD BP:      78.2 ml RIGHT VENTRICLE RV S prime:     9.73 cm/s TAPSE (M-mode): 2.6 cm LEFT ATRIUM         Index LA diam:    4.40 cm 1.93 cm/m  AORTIC VALVE LVOT Vmax:   108.00 cm/s LVOT Vmean:  68.400 cm/s LVOT VTI:    0.189 m  AORTA Ao Root diam: 3.80 cm Ao Asc diam:  3.90 cm MITRAL VALVE MV Area (PHT): 4.21 cm     SHUNTS MV Decel Time: 180 msec     Systemic VTI:  0.19 m MV E velocity: 95.10 cm/s   Systemic Diam: 2.20 cm MV A velocity: 110.00 cm/s MV E/A ratio:  0.86 Olga Millers MD Electronically signed by Olga Millers MD Signature Date/Time: 10/29/2020/12:11:24 PM    Final     Cardiac Studies   October 29, 2020 left heart catheterization CONCLUSIONS Widely patent left main Segmental mid 40 to 60% LAD.  First diagonal 60% ostial. Circumflex is relatively small and without any significant disease. Dominant right coronary.  Widely patent mid RCA stent.  Ectatic RCA beyond stent with 30% narrowing. Mid anterior wall severe hypokinesis.  LVEF 40 to 50%.  LVEDP 15 mmHg. Normal right heart pressures Hypoxia  despite high flow oxygen, uncertain etiology.  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 Resume beta blocker. Pulmonary embolism has not been excluded but seems less likely. Will notify pulmonary medicine the patient will be kept at [facility] and that there continued management is needed.  Patient Profile     70 year old Caucasian male with past medical history significant for OSA compliant with CPAP, OSA on CPAP, HTN, CAD with MI 2016 with BMS, DMT2, and HLD presenting from Surgical center with respiratory failure and found to have a troponin elevation.  Assessment & Plan     1.  CAD Continue metoprolol Cont  atorvastatin Recommend Aspirin 81mg  PO daily when felt safe from a surgical perspective.  2.  Hyperlipidemia  3.  Hypertension Controlled   Cardiology will sign off.     For questions or updates, please contact [facility] Please consult [email or web address] for contact info under        Signed, , MD  10/31/2020, 9:08 AM

## 2020-11-01 ENCOUNTER — Encounter (HOSPITAL_COMMUNITY): Payer: Self-pay | Admitting: Interventional Cardiology

## 2020-11-01 LAB — CBC
HCT: 41 % (ref 39.0–52.0)
Hemoglobin: 13.6 g/dL (ref 13.0–17.0)
MCH: 29.8 pg (ref 26.0–34.0)
MCHC: 33.2 g/dL (ref 30.0–36.0)
MCV: 89.9 fL (ref 80.0–100.0)
Platelets: 182 10*3/uL (ref 150–400)
RBC: 4.56 MIL/uL (ref 4.22–5.81)
RDW: 15.7 % — ABNORMAL HIGH (ref 11.5–15.5)
WBC: 13.1 10*3/uL — ABNORMAL HIGH (ref 4.0–10.5)
nRBC: 0 % (ref 0.0–0.2)

## 2020-11-01 LAB — CREATININE, SERUM
Creatinine, Ser: 1.16 mg/dL (ref 0.61–1.24)
GFR, Estimated: >60 mL/min (ref >60)

## 2020-11-01 MED FILL — Lidocaine Inj 1% w/ Epinephrine-1:100000: INTRAMUSCULAR | Qty: 20 | Status: AC

## 2020-11-03 DIAGNOSIS — G4733 Obstructive sleep apnea (adult) (pediatric): Secondary | ICD-10-CM | POA: Diagnosis not present

## 2020-11-15 NOTE — Progress Notes (Deleted)
Cardiology Clinic Note   Patient Name: [name] [name] Date of Encounter: 11/15/2020  Primary Care Provider:  Celene Squibb, MD Primary Cardiologist:  Minus Breeding, MD  Patient Profile    [name] [name] 70 year old male presents the clinic today for follow-up evaluation of his coronary artery disease and essential hypertension.  Past Medical History    Past Medical History:  Diagnosis Date   Coronary artery disease    S/P stent Summer 2008, January 2016 left main 30% stenosis, LAD mid 50% stenosis, patent stent in the mid right coronary artery with ectasia proximal and distal to the stent. EF mildly reduced at 40% with global hypokinesis.   Diabetes mellitus ([place])    A1c 6.1 (11-2008)   Erectile dysfunction    Hyperlipidemia    Hypertension    MI (mitral incompetence)    Myocardial infarction ([place]) 2016   Sleep apnea    URI (upper respiratory infection) 08/18/2011   Past Surgical History:  Procedure Laterality Date   COLONOSCOPY  2012   negative   COLONOSCOPY WITH PROPOFOL N/A 06/09/2020   Procedure: COLONOSCOPY WITH PROPOFOL;  Surgeon: Harvel Quale, MD;  [facility];  Service: Gastroenterology;  Laterality: N/A;  10:05   LEFT HEART CATH AND CORONARY ANGIOGRAPHY N/A 02/14/2017   Procedure: LEFT HEART CATH AND CORONARY ANGIOGRAPHY;  Surgeon: Troy Sine, MD;  Location: [place] [facility];  Service: Cardiovascular;  Laterality: N/A;   LEFT HEART CATH AND CORONARY ANGIOGRAPHY N/A 10/29/2020   Procedure: LEFT HEART CATH AND CORONARY ANGIOGRAPHY;  Surgeon: Belva Crome, MD;  Location: [place] [facility];  Service: Cardiovascular;  Laterality: N/A;   RIGHT HEART CATH N/A 10/29/2020   Procedure: RIGHT HEART CATH;  Surgeon: Belva Crome, MD;  Location: [place] [facility];  Service: Cardiovascular;  Laterality: N/A;   WRIST SURGERY     fractured wrist, Dr. Amedeo Plenty    Allergies  No Known Allergies  History of Present Illness    [name] is  a PMH of obstructive sleep apnea on CPAP, hypertension, coronary artery disease MI 2006 with BMS, diabetes mellitus type 2, and hyperlipidemia.  He presented to the emergency department from the surgical center with respiratory failure and was found to have elevated troponins 10/27/2020.  He was discharged on 10/31/2020.  He underwent cardiac catheterization on 10/29/2020.  He was found to have widely patent left main.  His mid LAD showed 40-60% stenosis, first diagonal 60% ostial stenosis circumflex relatively small without significant disease, dominant RCA with widely patent mid RCA stent.  His EF was noted to be 40-50%.  He had normal right heart pressures.  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  Supportive therapy was recommended.  He was placed back on his beta-blocker.  And felt to be stable for surgery.  He presents to the clinic today for follow-up evaluation states***  *** denies chest pain, shortness of breath, lower extremity edema, fatigue, palpitations, melena, hematuria, hemoptysis, diaphoresis, weakness, presyncope, syncope, orthopnea, and PND.   Home Medications    Prior to Admission medications   Medication Sig Start Date End Date Taking? Authorizing Provider  amLODipine (NORVASC) 5 MG tablet Take 1 tablet (5 mg total) by mouth daily. 11/01/20   Bonnell Public, MD  atorvastatin (LIPITOR) 40 MG tablet TAKE 1 TABLET BY MOUTH ONCE DAILY Patient taking differently: Take 40 mg by mouth every evening. 01/01/17   Minus Breeding, MD  metFORMIN (GLUCOPHAGE) 500 MG  tablet TAKE 1 TABLET BY MOUTH ONCE DAILY Patient taking differently: Take 500 mg by mouth every evening. 04/16/17   Rollene Rotunda, MD  metoprolol succinate (TOPROL-XL) 100 MG 24 hr tablet TAKE 1 TABLET BY MOUTH ONCE DAILY WITH  OR  IMMEDIATELY  FOLLOWING  A  MEAL Patient taking differently: Take 100 mg by mouth in the morning. 04/16/17   Rollene Rotunda, MD  nitroGLYCERIN (NITROSTAT) 0.4 MG SL tablet  Place 1 tablet (0.4 mg total) under the tongue every 5 (five) minutes as needed. 04/20/20   Corrin Parker, PA-C    Family History    Family History  Problem Relation Age of Onset   Pernicious anemia Maternal Grandmother    Coronary artery disease Father 18   Colon cancer Father        ?   Stroke Mother 21   Hypertension Brother    Stroke Maternal Grandfather    CAD Brother 11   CAD Brother 81   He indicated that his mother is deceased. He indicated that his father is deceased. He indicated that his sister is alive. He indicated that all of his three brothers are alive. He indicated that his maternal grandmother is deceased. He indicated that his maternal grandfather is deceased. He indicated that his paternal grandmother is deceased. He indicated that his paternal grandfather is deceased. He indicated that both of his sons are alive.  Social History    Social History   Socioeconomic History   Marital status: Married    Spouse name: Not on file   Number of children: 2   Years of education: Not on file   Highest education level: Not on file  Occupational History   Occupation: Event organiser: [employer]  Tobacco Use   Smoking status: Former    Types: Cigarettes    Quit date: 01/10/2003    Years since quitting: 17.8   Smokeless tobacco: Never  Vaping Use   Vaping Use: Never used  Substance and Sexual Activity   Alcohol use: Yes    Comment: rarely   Drug use: Yes    Types: Marijuana   Sexual activity: Not on file  Other Topics Concern   Not on file  Social History Narrative   06/14/2009 designated party form signed appointing wife Kayle Correa; Ok to leave message on home answering machine at [phone]   Social Determinants of Health   Financial Resource Strain: Not on file  Food Insecurity: Not on file  Transportation Needs: Not on file  Physical Activity: Not on file  Stress: Not on file  Social Connections: Not on file  Intimate Partner  Violence: Not on file     Review of Systems    General:  No chills, fever, night sweats or weight changes.  Cardiovascular:  No chest pain, dyspnea on exertion, edema, orthopnea, palpitations, paroxysmal nocturnal dyspnea. Dermatological: No rash, lesions/masses Respiratory: No cough, dyspnea Urologic: No hematuria, dysuria Abdominal:   No nausea, vomiting, diarrhea, bright red blood per rectum, melena, or hematemesis Neurologic:  No visual changes, wkns, changes in mental status. All other systems reviewed and are otherwise negative except as noted above.  Physical Exam    VS:  There were no vitals taken for this visit. , BMI There is no height or weight on file to calculate BMI. GEN: Well nourished, well developed, in no acute distress. HEENT: normal. Neck: Supple, no JVD, carotid bruits, or masses. Cardiac: RRR, no murmurs, rubs, or gallops. No clubbing,  cyanosis, edema.  Radials/DP/PT 2+ and equal bilaterally.  Respiratory:  Respirations regular and unlabored, clear to auscultation bilaterally. GI: Soft, nontender, nondistended, BS + x 4. MS: no deformity or atrophy. Skin: warm and dry, no rash. Neuro:  Strength and sensation are intact. Psych: Normal affect.  Accessory Clinical Findings    Recent Labs: 10/27/2020: B Natriuretic Peptide 56.3 10/30/2020: ALT 19 10/31/2020: BUN 18; Creatinine, Ser 1.09; Hemoglobin 12.2; Magnesium 2.1; Platelets 164; Potassium 3.5; Sodium 136   Recent Lipid Panel    Component Value Date/Time   CHOL 130 04/11/2016 0921   TRIG 122 04/11/2016 0921   HDL 37 (L) 04/11/2016 0921   CHOLHDL 3.5 04/11/2016 0921   CHOLHDL 4 08/22/2010 1101   VLDL 24.8 08/22/2010 1101   LDLCALC 69 04/11/2016 0921    ECG personally reviewed by me today- *** - No acute changes  Echocardiogram 10/29/2020 IMPRESSIONS     1. Left ventricular ejection fraction, by estimation, is 55 to 60%. The  left ventricle has normal function. The left ventricle has no  regional  wall motion abnormalities. The left ventricular internal cavity size was  mildly dilated. Left ventricular  diastolic parameters are consistent with Grade I diastolic dysfunction  (impaired relaxation).   2. Right ventricular systolic function is normal. The right ventricular  size is normal.   3. The mitral valve is normal in structure. Trivial mitral valve  regurgitation. No evidence of mitral stenosis.   4. The aortic valve is tricuspid. Aortic valve regurgitation is not  visualized. No aortic stenosis is present.   5. Aortic dilatation noted. There is borderline dilatation of the  ascending aorta, measuring 39 mm.   6. The inferior vena cava is normal in size with greater than 50%  respiratory variability, suggesting right atrial pressure of 3 mmHg.  Cardiac catheterization 10/29/2020 CONCLUSIONS Widely patent left main Segmental mid 40 to 60% LAD.  First diagonal 60% ostial. Circumflex is relatively small and without any significant disease. Dominant right coronary.  Widely patent mid RCA stent.  Ectatic RCA beyond stent with 30% narrowing. Mid anterior wall severe hypokinesis.  LVEF 40 to 50%.  LVEDP 15 mmHg. Normal right heart pressures Hypoxia despite high flow oxygen, uncertain etiology.   RECOMMENDATIONS:   MINOCA with possible atypical stress-induced cardiomyopathy Supportive therapy. Resume beta blocker. Pulmonary embolism has not been excluded but seems less likely. Will notify pulmonary medicine the patient will be kept at [facility] and that there continued management is needed.  Diagnostic Dominance: Right Intervention   Assessment & Plan   1.  Coronary artery disease-denies chest pain today.  Underwent cardiac catheterization on 10/29/2020.  He was noted to have stable coronary artery disease.  Medical management was recommended.  Details above.  Echocardiogram 10/29/2020 showed normalized LVEF. Continue metoprolol, atorvastatin, aspirin,  nitroglycerin as needed Heart healthy low-sodium diet-salty 6 given Increase physical activity as tolerated  Hyperlipidemia-LDL*** Continue aspirin, atorvastatin Heart healthy low-sodium high-fiber diet Increase physical activity as tolerated Repeat fasting lipids and LFTs  Essential hypertension-BP today***.  Well-controlled at home. Continue metoprolol, amlodipine Heart healthy low-sodium diet-salty 6 given Increase physical activity as tolerated   Disposition: Follow-up with Dr. Percival Spanish or me in 4-6 months.  Jossie Ng. Ikeisha Blumberg NP-C    11/15/2020, 8:27 AM [address] Office [phone] Fax [phone]  Notice: This dictation was prepared with Dragon dictation along with smaller phrase technology. Any transcriptional errors that result from this process are unintentional and  may not be corrected upon review.  I spent***minutes examining this patient, reviewing medications, and using patient centered shared decision making involving her cardiac care.  Prior to her visit I spent greater than 20 minutes reviewing her past medical history,  medications, and prior cardiac tests.

## 2020-11-16 ENCOUNTER — Ambulatory Visit: Payer: Medicare Other | Admitting: General Practice

## 2020-12-13 NOTE — Discharge Summary (Signed)
Physician Discharge Summary  [name] [name] [id number] DOB: Jul 12, 1950 DOA: 10/27/2020  PCP: Celene Squibb, MD  Admit date: 10/27/2020 Discharge date: 12/13/2020  Time spent: 35 minutes   Discharge Diagnoses:  Principal Problem:   Non-ST elevation (NSTEMI) myocardial infarction [facility]) Active Problems:   Obesity (BMI 30.0-34.9)   Coronary artery disease involving native coronary artery of native heart with angina pectoris [facility])   Respiratory failure requiring intubation ([place])   Status post hemorrhoidectomy 10/27/2020   Acute respiratory failure with hypoxia (HCC)   CKD (chronic kidney disease) stage 3, GFR 30-59 ml/min (HCC)   Discharge Condition: Stable  Diet recommendation: Consistent carbohydrate diet/Cardiac diet  Filed Weights   10/29/20 0500 10/30/20 0413 10/31/20 0500  Weight: 106.3 kg 104.1 kg 104.4 kg    Brief Summary:  Patient is a 70 year old Caucasian male with past medical history significant for OSA compliant with CPAP, Hypertension, CAD with MI 2016 with BMS, DMT2, and HLD presenting from Surgical center with respiratory failure.    Patient underwent hemorrhoidectomy by Dr. Johney Maine at the [place] which was uncomplicated; he was prone for around 1.5 hrs.  He was extubated in PACU but developed respiratory distress, became hypoxic with pink frothy secretions flash pulmonary edema.  He was reintubated by anesthesia with CXR showing pulmonary edema.  Patient was initially under the critical care team was a long hospital but was transferred over to [facility] under the hospitalist team yesterday.  Reason for transfer is shortness of breath and chest pain.  There were concerns for acute MI.  Patient underwent cardiac catheterization without any significant changes.  Pulmonary edema has improved significantly.  According to the patient, he is less short of breath today and able to communicate with a lot better.  No chest pain.  No other constitutional symptoms  reported.  Cardiac catheterization done on 10/29/2020 revealed:                                       50% mid LAD and 40% ostial large diagonal Left main is widely patent Circumflex system is small and without obstructive disease Mid RCA stent is widely patent.  Within the mid RCA, there is eccentric 50% narrowing. Mid anterior wall hypokinesis.  LVEDP 15 mmHg. Right heart pressures: Capillary wedge pressure mean, 15 mmHg.  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        Hospital Course:  Acute hypoxic respiratory failure secondary to flash pulmonary edema: -Resolved significantly. -No further shortness of breath or chest pain reported. -Supplemented oxygen will be titrated to 0.  Patient was not on oxygen prior to admission.. -Likely discharge tomorrow.    NSTEMI/flash pulmonary edema/chest pain: -Patient underwent cardiac catheterization. -See above. -No further chest pain no shortness of breath reported. -Cardiology input is appreciated.   Grade 1 diastolic dysfunction echocardiogram 6/22 - Prior to & Present on Admit  -Continue home aspirin, lisinopril, metoprolol and Lasix    Hypertensive urgency: -Continue nitroglycerin.   -Gradually optimize.   Acute kidney injury versus acute kidney injury on chronic kidney disease:  -Acute kidney injury has resolved. -Serum creatinine is down to 1.15.     Prolapsed Internal Hemorrhoids s/p elective uncomplicated hemorrhoidectomy -primary admission problem 10/30/2020: No bleeding. Will defer  to the surgical team.   DM: -Continue to optimize.  OSA: -Continue home CPAP.    Procedures: Cardiac catheterization    Consultations: Cardiology Critical care/Pulmonary medicine  Discharge Exam: Vitals:    10/31/20 0454 10/31/20 1002  BP: (!) 141/71 (!) 161/76  Pulse: 64 68  Resp: 19   Temp: 98.2 F (36.8 C)   SpO2: 97%     Discharge Instructions     Diet - low sodium heart healthy   Complete by: As directed    Discharge wound care:   Complete by: As directed    Continue current wound care plan.   Increase activity slowly   Complete by: As directed       Allergies as of 10/31/2020   No Known Allergies      Medication List     STOP taking these medications    aspirin EC 81 MG tablet   furosemide 20 MG tablet Commonly known as: LASIX   lisinopril 5 MG tablet Commonly known as: ZESTRIL   potassium chloride 10 MEQ tablet Commonly known as: KLOR-CON       TAKE these medications    amLODipine 5 MG tablet Commonly known as: NORVASC Take 1 tablet (5 mg total) by mouth daily.   atorvastatin 40 MG tablet Commonly known as: LIPITOR TAKE 1 TABLET BY MOUTH ONCE DAILY What changed: when to take this   metFORMIN 500 MG tablet Commonly known as: GLUCOPHAGE TAKE 1 TABLET BY MOUTH ONCE DAILY What changed: when to take this   metoprolol succinate 100 MG 24 hr tablet Commonly known as: TOPROL-XL TAKE 1 TABLET BY MOUTH ONCE DAILY WITH  OR  IMMEDIATELY  FOLLOWING  A  MEAL What changed: See the new instructions.   nitroGLYCERIN 0.4 MG SL tablet Commonly known as: NITROSTAT Place 1 tablet (0.4 mg total) under the tongue every 5 (five) minutes as needed.               Discharge Care Instructions  (From admission, onward)           Start     Ordered   10/31/20 0000  Discharge wound care:       Comments: Continue current wound care plan.   10/31/20 1156           No Known Allergies    The results of significant diagnostics from this hospitalization (including imaging, microbiology, ancillary and laboratory) are listed below for reference.    Significant Diagnostic Studies: No results found.  Microbiology: No results found for this or any  previous visit (from the past 240 hour(s)).   Labs: Basic Metabolic Panel: No results for input(s): NA, K, CL, CO2, GLUCOSE, BUN, CREATININE, CALCIUM, MG, PHOS in the last 168 hours. Liver Function Tests: No results for input(s): AST, ALT, ALKPHOS, BILITOT, PROT, ALBUMIN in the last 168 hours. No results for input(s): LIPASE, AMYLASE in the last 168 hours. No results for input(s): AMMONIA in the last 168 hours. CBC: No results for input(s): WBC, NEUTROABS, HGB, HCT, MCV, PLT in the last 168 hours. Cardiac Enzymes: No results for input(s): CKTOTAL, CKMB, CKMBINDEX, TROPONINI in the last 168 hours. BNP: BNP (last 3 results) Recent Labs    10/27/20 1303  BNP 56.3    ProBNP (last 3 results) No results for input(s): PROBNP in the last 8760 hours.  CBG: No results for input(s): GLUCAP in the last 168 hours.     Signed:  Berton Mount, MD  [facility] Pager #: [phone]  [phone] 7PM-7AM contact night coverage as above

## 2021-01-17 DIAGNOSIS — E1122 Type 2 diabetes mellitus with diabetic chronic kidney disease: Secondary | ICD-10-CM | POA: Diagnosis not present

## 2021-01-17 DIAGNOSIS — E782 Mixed hyperlipidemia: Secondary | ICD-10-CM | POA: Diagnosis not present

## 2021-01-20 DIAGNOSIS — E1122 Type 2 diabetes mellitus with diabetic chronic kidney disease: Secondary | ICD-10-CM | POA: Diagnosis not present

## 2021-01-20 DIAGNOSIS — E782 Mixed hyperlipidemia: Secondary | ICD-10-CM | POA: Diagnosis not present

## 2021-01-20 DIAGNOSIS — G4730 Sleep apnea, unspecified: Secondary | ICD-10-CM | POA: Diagnosis not present

## 2021-01-20 DIAGNOSIS — Z23 Encounter for immunization: Secondary | ICD-10-CM | POA: Diagnosis not present

## 2021-01-20 DIAGNOSIS — N1831 Chronic kidney disease, stage 3a: Secondary | ICD-10-CM | POA: Diagnosis not present

## 2021-01-20 DIAGNOSIS — M62830 Muscle spasm of back: Secondary | ICD-10-CM | POA: Diagnosis not present

## 2021-01-20 DIAGNOSIS — I10 Essential (primary) hypertension: Secondary | ICD-10-CM | POA: Diagnosis not present

## 2021-01-20 DIAGNOSIS — I5022 Chronic systolic (congestive) heart failure: Secondary | ICD-10-CM | POA: Diagnosis not present

## 2021-01-20 DIAGNOSIS — K649 Unspecified hemorrhoids: Secondary | ICD-10-CM | POA: Diagnosis not present

## 2021-01-20 DIAGNOSIS — R0600 Dyspnea, unspecified: Secondary | ICD-10-CM | POA: Diagnosis not present

## 2021-04-25 DIAGNOSIS — H9209 Otalgia, unspecified ear: Secondary | ICD-10-CM | POA: Diagnosis not present

## 2021-05-05 DIAGNOSIS — G4733 Obstructive sleep apnea (adult) (pediatric): Secondary | ICD-10-CM | POA: Diagnosis not present

## 2021-05-10 DIAGNOSIS — N390 Urinary tract infection, site not specified: Secondary | ICD-10-CM | POA: Diagnosis not present

## 2021-05-10 DIAGNOSIS — H9193 Unspecified hearing loss, bilateral: Secondary | ICD-10-CM | POA: Diagnosis not present

## 2021-05-19 DIAGNOSIS — E782 Mixed hyperlipidemia: Secondary | ICD-10-CM | POA: Diagnosis not present

## 2021-05-19 DIAGNOSIS — E1122 Type 2 diabetes mellitus with diabetic chronic kidney disease: Secondary | ICD-10-CM | POA: Diagnosis not present

## 2021-05-20 DIAGNOSIS — Z87891 Personal history of nicotine dependence: Secondary | ICD-10-CM | POA: Diagnosis not present

## 2021-05-20 DIAGNOSIS — H608X3 Other otitis externa, bilateral: Secondary | ICD-10-CM | POA: Diagnosis not present

## 2021-05-20 DIAGNOSIS — J342 Deviated nasal septum: Secondary | ICD-10-CM | POA: Diagnosis not present

## 2021-05-25 DIAGNOSIS — I2510 Atherosclerotic heart disease of native coronary artery without angina pectoris: Secondary | ICD-10-CM | POA: Diagnosis not present

## 2021-05-25 DIAGNOSIS — N1831 Chronic kidney disease, stage 3a: Secondary | ICD-10-CM | POA: Diagnosis not present

## 2021-05-25 DIAGNOSIS — M62830 Muscle spasm of back: Secondary | ICD-10-CM | POA: Diagnosis not present

## 2021-05-25 DIAGNOSIS — I10 Essential (primary) hypertension: Secondary | ICD-10-CM | POA: Diagnosis not present

## 2021-05-25 DIAGNOSIS — E782 Mixed hyperlipidemia: Secondary | ICD-10-CM | POA: Diagnosis not present

## 2021-05-25 DIAGNOSIS — G4730 Sleep apnea, unspecified: Secondary | ICD-10-CM | POA: Diagnosis not present

## 2021-05-25 DIAGNOSIS — E1122 Type 2 diabetes mellitus with diabetic chronic kidney disease: Secondary | ICD-10-CM | POA: Diagnosis not present

## 2021-05-25 DIAGNOSIS — I5022 Chronic systolic (congestive) heart failure: Secondary | ICD-10-CM | POA: Diagnosis not present

## 2021-05-25 DIAGNOSIS — R0600 Dyspnea, unspecified: Secondary | ICD-10-CM | POA: Diagnosis not present

## 2021-05-25 DIAGNOSIS — K649 Unspecified hemorrhoids: Secondary | ICD-10-CM | POA: Diagnosis not present

## 2021-06-07 DIAGNOSIS — G4733 Obstructive sleep apnea (adult) (pediatric): Secondary | ICD-10-CM | POA: Insufficient documentation

## 2021-06-07 NOTE — Progress Notes (Unsigned)
Cardiology Office Note   Date:  06/08/2021   ID:  [name] [name], DOB 05/20/1950, MRN [id number]  PCP:  Benita Stabile, MD  Cardiologist:   Rollene Rotunda, MD   Chief Complaint  Patient presents with   Fatigue      History of Present Illness: [name] [name] is a 71 y.o. male who presents for follow up of CAD.  He has a history of a remote BMS to RCA in 2008 in the setting of MI. Most recent Echo in 04/2016 showed LVEF of 45-50% with hypokinesis of the inferior myocardium and mild MR. Myoview was ordered in 01/2017 for further evaluation of dyspnea on exertion and was abnormal. Therefore, patient underwent LHC in 02/2017 which showed mild non-obstructive CAD with widely patent stent in RCA. Patient also has obstructive sleep apnea and is on BIPAP. This is managed by Dr. Tresa Endo.     He had hemorrhoidectomy in October  which was uncomplicated; he was prone for around 1.5 hrs.  He was extubated in PACU but developed respiratory distress, became hypoxic with pink frothy secretions flash pulmonary edema.  He was reintubated by anesthesia with CXR showing pulmonary edema.   I reviewed these records for this visit.     Cath suggest that his EF was slightly low with some anterior hypokinesis but his ejection fraction was well-preserved on the follow-up echo.  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  He is not having any acute shortness of breath, PND or orthopnea.  He is not having any palpitations, presyncope or syncope.  Has had no chest pain.  However, he just does not feel like he has as much exercise tolerance as he did previously.  He sometimes has to stop to catch his breath.   Past Medical History:  Diagnosis Date   Coronary artery disease    S/P stent Summer 2008, January 2016 left main 30% stenosis, LAD mid 50% stenosis, patent stent in the mid right coronary artery with ectasia  proximal and distal to the stent. EF mildly reduced at 40% with global hypokinesis.   Diabetes mellitus (HCC)    A1c 6.1 (11-2008)   Erectile dysfunction    Hyperlipidemia    Hypertension    MI (mitral incompetence)    Myocardial infarction (HCC) 2016   Sleep apnea    URI (upper respiratory infection) 08/18/2011    Past Surgical History:  Procedure Laterality Date   COLONOSCOPY  2012   negative   COLONOSCOPY WITH PROPOFOL N/A 06/09/2020   Procedure: COLONOSCOPY WITH PROPOFOL;  Surgeon: Dolores Frame, MD;  [facility];  Service: Gastroenterology;  Laterality: N/A;  10:05   LEFT HEART CATH AND CORONARY ANGIOGRAPHY N/A 02/14/2017   Procedure: LEFT HEART CATH AND CORONARY ANGIOGRAPHY;  Surgeon: Lennette Bihari, MD;  Location: [facility];  Service: Cardiovascular;  Laterality: N/A;   LEFT HEART CATH AND CORONARY ANGIOGRAPHY N/A 10/29/2020   Procedure: LEFT HEART CATH AND CORONARY ANGIOGRAPHY;  Surgeon: Lyn Records, MD;  Location: [facility];  Service: Cardiovascular;  Laterality: N/A;   RIGHT HEART CATH N/A 10/29/2020   Procedure: RIGHT HEART CATH;  Surgeon: Lyn Records, MD;  Location: [facility] INVASIVE CV LAB;  Service: Cardiovascular;  Laterality: N/A;   WRIST SURGERY     fractured wrist, Dr. Amanda Pea  Current Outpatient Medications  Medication Sig Dispense Refill   amLODipine (NORVASC) 5 MG tablet Take 1 tablet (5 mg total) by mouth daily. 30 tablet 0   atorvastatin (LIPITOR) 40 MG tablet TAKE 1 TABLET BY MOUTH ONCE DAILY (Patient taking differently: Take 40 mg by mouth every evening.) 90 tablet 2   metFORMIN (GLUCOPHAGE) 500 MG tablet TAKE 1 TABLET BY MOUTH ONCE DAILY (Patient taking differently: Take 500 mg by mouth every evening.) 90 tablet 3   nitroGLYCERIN (NITROSTAT) 0.4 MG SL tablet Place 1 tablet (0.4 mg total) under the tongue every 5 (five) minutes as needed. 25 tablet 1   metoprolol succinate (TOPROL-XL) 50 MG 24 hr tablet Take 1 tablet  (50 mg total) by mouth daily. Take with or immediately following a meal. 90 tablet 3   No current facility-administered medications for this visit.    Allergies:   Patient has no known allergies.    ROS:  Please see the history of present illness.   Otherwise, review of systems are positive for none.   All other systems are reviewed and negative.    PHYSICAL EXAM: VS:  BP 132/70 (BP Location: Left Arm, Patient Position: Sitting, Cuff Size: Large)   Pulse (!) 55   Ht 6' (1.829 m)   Wt 260 lb (117.9 kg)   BMI 35.26 kg/m  , BMI Body mass index is 35.26 kg/m. GENERAL:  Well appearing NECK:  No jugular venous distention, waveform within normal limits, carotid upstroke brisk and symmetric, no bruits, no thyromegaly LUNGS:  Clear to auscultation bilaterally CHEST:  Unremarkable HEART:  PMI not displaced or sustained,S1 and S2 within normal limits, no S3, no S4, no clicks, no rubs, no murmurs ABD:  Flat, positive bowel sounds normal in frequency in pitch, no bruits, no rebound, no guarding, no midline pulsatile mass, no hepatomegaly, no splenomegaly EXT:  2 plus pulses throughout, no edema, no cyanosis no clubbing   EKG:  EKG is ordered today. The ekg ordered today demonstrates sinus rhythm, rate 55, axis within normal limits, intervals within normal limits, no acute ST-T wave changes   Recent Labs: 10/27/2020: B Natriuretic Peptide 56.3 10/30/2020: ALT 19 10/31/2020: BUN 18; Creatinine, Ser 1.09; Hemoglobin 12.2; Magnesium 2.1; Platelets 164; Potassium 3.5; Sodium 136    Lipid Panel    Component Value Date/Time   CHOL 130 04/11/2016 0921   TRIG 122 04/11/2016 0921   HDL 37 (L) 04/11/2016 0921   CHOLHDL 3.5 04/11/2016 0921   CHOLHDL 4 08/22/2010 1101   VLDL 24.8 08/22/2010 1101   LDLCALC 69 04/11/2016 0921      Wt Readings from Last 3 Encounters:  06/08/21 260 lb (117.9 kg)  10/31/20 230 lb 1.6 oz (104.4 kg)  04/20/20 236 lb (107 kg)    CATH   Widely patent left  main Segmental mid 40 to 60% LAD.  First diagonal 60% ostial. Circumflex is relatively small and without any significant disease. Dominant right coronary.  Widely patent mid RCA stent.  Ectatic RCA beyond stent with 30% narrowing. Mid anterior wall severe hypokinesis.  LVEF 40 to 50%.  LVEDP 15 mmHg. Normal right heart pressures Hypoxia despite high flow oxygen, uncertain etiology.   RECOMMENDATIONS:   MINOCA with possible atypical stress-induced cardiomyopathy Supportive therapy. Resume beta blocker. Pulmonary embolism has not been excluded but seems less likely. Will notify pulmonary medicine the patient will be kept at [facility] and that there continued management is needed.  Diagnostic Dominance: Right Intervention  Other studies  Reviewed: Additional studies/ records that were reviewed today include: Hospital records. Review of the above records demonstrates:  Please see elsewhere in the note.     ASSESSMENT AND PLAN:   CAD :   He had a patent BMS as above.  No change in therapy.   Cardiomyopathy: His EF is preserved according to most recent echo.  No change in therapy.  Mitral Regurgitation: He had only mild MR.  No change in therapy.   Hypertension: His blood pressure is controlled.  I will be reducing his metoprolol to 50 mg daily given his fatigue but he will keep an eye on his blood pressure.  Hyperlipidemia: LDL was 86.  HDL was 39.  For now continue the meds as listed.  Type 2 Diabetes Mellitus: A1c of 6.0.  Continue meds as listed.   Obstructive Sleep Apnea: He does use a CPAP.  No change in therapy.  Current medicines are reviewed at length with the patient today.  The patient does not have concerns regarding medicines.  The following changes have been made: As above  Labs/ tests ordered today include:   Orders Placed This Encounter  Procedures   EKG 12-Lead     Disposition:   FU with me in six month    Signed, Rollene Rotunda, MD   06/08/2021 11:51 AM    [facility]

## 2021-06-08 ENCOUNTER — Encounter: Payer: Self-pay | Admitting: Cardiology

## 2021-06-08 ENCOUNTER — Ambulatory Visit: Payer: Medicare Other | Admitting: Cardiology

## 2021-06-08 VITALS — BP 132/70 | HR 55 | Ht 72.0 in | Wt 260.0 lb

## 2021-06-08 DIAGNOSIS — I2510 Atherosclerotic heart disease of native coronary artery without angina pectoris: Secondary | ICD-10-CM | POA: Diagnosis not present

## 2021-06-08 DIAGNOSIS — G4733 Obstructive sleep apnea (adult) (pediatric): Secondary | ICD-10-CM | POA: Diagnosis not present

## 2021-06-08 DIAGNOSIS — I429 Cardiomyopathy, unspecified: Secondary | ICD-10-CM | POA: Diagnosis not present

## 2021-06-08 DIAGNOSIS — E785 Hyperlipidemia, unspecified: Secondary | ICD-10-CM | POA: Diagnosis not present

## 2021-06-08 DIAGNOSIS — E118 Type 2 diabetes mellitus with unspecified complications: Secondary | ICD-10-CM | POA: Diagnosis not present

## 2021-06-08 DIAGNOSIS — I10 Essential (primary) hypertension: Secondary | ICD-10-CM

## 2021-06-08 MED ORDER — METOPROLOL SUCCINATE ER 50 MG PO TB24: 50.0000 mg | ORAL_TABLET | Freq: Every day | ORAL | 3 refills | Status: AC

## 2021-06-08 NOTE — Patient Instructions (Addendum)
Medication Instructions:  DECREASE: METOPROLOL TO 50mg  ONCE DAILY   *If you need a refill on your cardiac medications before your next appointment, please call your pharmacy*  Follow-Up: At [facility], you and your health needs are our priority.  As part of our continuing mission to provide you with exceptional heart care, we have created designated Provider Care Teams.  These Care Teams include your primary Cardiologist (physician) and Advanced Practice Providers (APPs -  Physician Assistants and Nurse Practitioners) who all work together to provide you with the care you need, when you need it.  We recommend signing up for the patient portal called "MyChart".  Sign up information is provided on this After Visit Summary.  MyChart is used to connect with patients for Virtual Visits (Telemedicine).  Patients are able to view lab/test results, encounter notes, upcoming appointments, etc.  Non-urgent messages can be sent to your provider as well.   To learn more about what you can do with MyChart, go to [facility].    Your next appointment:   6 month(s)  The format for your next appointment:   In Person  Provider:   Any APP or Dr. [email or web address]

## 2021-06-09 DIAGNOSIS — J342 Deviated nasal septum: Secondary | ICD-10-CM | POA: Diagnosis not present

## 2021-06-09 DIAGNOSIS — H608X3 Other otitis externa, bilateral: Secondary | ICD-10-CM | POA: Diagnosis not present

## 2021-07-08 DIAGNOSIS — N1831 Chronic kidney disease, stage 3a: Secondary | ICD-10-CM | POA: Diagnosis not present

## 2021-07-08 DIAGNOSIS — I2510 Atherosclerotic heart disease of native coronary artery without angina pectoris: Secondary | ICD-10-CM | POA: Diagnosis not present

## 2021-07-08 DIAGNOSIS — I129 Hypertensive chronic kidney disease with stage 1 through stage 4 chronic kidney disease, or unspecified chronic kidney disease: Secondary | ICD-10-CM | POA: Diagnosis not present

## 2021-07-08 DIAGNOSIS — E1122 Type 2 diabetes mellitus with diabetic chronic kidney disease: Secondary | ICD-10-CM | POA: Diagnosis not present

## 2021-09-20 DIAGNOSIS — E782 Mixed hyperlipidemia: Secondary | ICD-10-CM | POA: Diagnosis not present

## 2021-09-20 DIAGNOSIS — E1122 Type 2 diabetes mellitus with diabetic chronic kidney disease: Secondary | ICD-10-CM | POA: Diagnosis not present

## 2021-09-27 DIAGNOSIS — Z0000 Encounter for general adult medical examination without abnormal findings: Secondary | ICD-10-CM | POA: Diagnosis not present

## 2021-09-27 DIAGNOSIS — I5022 Chronic systolic (congestive) heart failure: Secondary | ICD-10-CM | POA: Diagnosis not present

## 2021-09-27 DIAGNOSIS — N1831 Chronic kidney disease, stage 3a: Secondary | ICD-10-CM | POA: Diagnosis not present

## 2021-09-27 DIAGNOSIS — G4730 Sleep apnea, unspecified: Secondary | ICD-10-CM | POA: Diagnosis not present

## 2021-09-27 DIAGNOSIS — I10 Essential (primary) hypertension: Secondary | ICD-10-CM | POA: Diagnosis not present

## 2021-09-27 DIAGNOSIS — Z23 Encounter for immunization: Secondary | ICD-10-CM | POA: Diagnosis not present

## 2021-09-27 DIAGNOSIS — K649 Unspecified hemorrhoids: Secondary | ICD-10-CM | POA: Diagnosis not present

## 2021-09-27 DIAGNOSIS — E1122 Type 2 diabetes mellitus with diabetic chronic kidney disease: Secondary | ICD-10-CM | POA: Diagnosis not present

## 2021-09-27 DIAGNOSIS — E782 Mixed hyperlipidemia: Secondary | ICD-10-CM | POA: Diagnosis not present

## 2021-09-27 DIAGNOSIS — R0600 Dyspnea, unspecified: Secondary | ICD-10-CM | POA: Diagnosis not present

## 2021-09-28 ENCOUNTER — Other Ambulatory Visit (HOSPITAL_COMMUNITY): Payer: Self-pay | Admitting: Radiology

## 2021-09-28 DIAGNOSIS — R0600 Dyspnea, unspecified: Secondary | ICD-10-CM

## 2021-10-05 ENCOUNTER — Ambulatory Visit (HOSPITAL_COMMUNITY)
Admission: RE | Admit: 2021-10-05 | Discharge: 2021-10-05 | Disposition: A | Discharge status: Home / Self Care | Payer: Medicare Other | Source: Ambulatory Visit | Attending: Internal Medicine | Admitting: Internal Medicine

## 2021-10-05 DIAGNOSIS — R0600 Dyspnea, unspecified: Secondary | ICD-10-CM | POA: Insufficient documentation

## 2021-10-05 LAB — PULMONARY FUNCTION TEST
DL/VA % pred: 91 %
DL/VA: 3.69 ml/min/mmHg/L
DLCO unc % pred: 86 %
DLCO unc: 23.78 ml/min/mmHg
FEF 25-75 Post: 2.99 L/sec
FEF 25-75 Pre: 2.31 L/sec
FEF2575-%Change-Post: 29 %
FEF2575-%Pred-Post: 112 %
FEF2575-%Pred-Pre: 86 %
FEV1-%Change-Post: 5 %
FEV1-%Pred-Post: 89 %
FEV1-%Pred-Pre: 84 %
FEV1-Post: 3.15 L
FEV1-Pre: 2.97 L
FEV1FVC-%Change-Post: 4 %
FEV1FVC-%Pred-Pre: 101 %
FEV6-%Change-Post: 3 %
FEV6-%Pred-Post: 88 %
FEV6-%Pred-Pre: 85 %
FEV6-Post: 3.99 L
FEV6-Pre: 3.86 L
FEV6FVC-%Change-Post: 1 %
FEV6FVC-%Pred-Post: 105 %
FEV6FVC-%Pred-Pre: 103 %
FVC-%Change-Post: 1 %
FVC-%Pred-Post: 84 %
FVC-%Pred-Pre: 83 %
FVC-Post: 4.01 L
FVC-Pre: 3.96 L
Post FEV1/FVC ratio: 78 %
Post FEV6/FVC ratio: 99 %
Pre FEV1/FVC ratio: 75 %
Pre FEV6/FVC Ratio: 98 %
RV % pred: 113 %
RV: 2.9 L
TLC % pred: 97 %
TLC: 7.25 L

## 2021-10-05 MED ORDER — ALBUTEROL SULFATE (2.5 MG/3ML) 0.083% IN NEBU
2.5000 mg | INHALATION_SOLUTION | Freq: Once | RESPIRATORY_TRACT | Status: AC
  Administered 2021-10-05: 2.5 mg via RESPIRATORY_TRACT

## 2021-11-25 ENCOUNTER — Encounter: Payer: Self-pay | Admitting: Cardiology

## 2021-12-09 ENCOUNTER — Ambulatory Visit: Payer: Medicare Other | Admitting: Cardiology

## 2021-12-27 DIAGNOSIS — G4733 Obstructive sleep apnea (adult) (pediatric): Secondary | ICD-10-CM | POA: Diagnosis not present

## 2022-01-19 DIAGNOSIS — E1122 Type 2 diabetes mellitus with diabetic chronic kidney disease: Secondary | ICD-10-CM | POA: Diagnosis not present

## 2022-01-19 DIAGNOSIS — E782 Mixed hyperlipidemia: Secondary | ICD-10-CM | POA: Diagnosis not present

## 2022-01-19 NOTE — Progress Notes (Signed)
Cardiology Office Note   Date:  01/20/2022   ID:  [name] [name], DOB Jun 09, 1950, MRN [id number]  PCP:  Benita Stabile, MD  Cardiologist:   Rollene Rotunda, MD   Chief Complaint  Patient presents with   Shortness of Breath      History of Present Illness: [name] [name] is a 72 y.o. male who presents for follow up of CAD.  He has a history of a remote BMS to RCA in 2008 in the setting of MI. Most recent Echo in 04/2016 showed LVEF of 45-50% with hypokinesis of the inferior myocardium and mild MR. Myoview was ordered in 01/2017 for further evaluation of dyspnea on exertion and was abnormal. Therefore, patient underwent LHC in 02/2017 which showed mild non-obstructive CAD with widely patent stent in RCA. Patient also has obstructive sleep apnea and is on BIPAP. This is managed by Dr. Tresa [name].     He had hemorrhoidectomy in October 2023 which was uncomplicated; he was prone for around 1.5 hrs.  He was extubated in PACU but developed respiratory distress, became hypoxic with pink frothy secretions flash pulmonary edema.  He was reintubated by anesthesia with CXR showing pulmonary edema.   Cath suggest that his EF was slightly low with some anterior hypokinesis but his ejection fraction was well-preserved on the follow-up echo.  He returns for follow up.     He put on quite a bit of weight without hospitalization and he is just now starting to get down from this.  And he does think that his breathing is a little bit better as he loses weight but is not quite at baseline.  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  He is not having any chest pressure, neck or arm discomfort.  He had no PND or orthopnea.  He said no palpitations, presyncope or syncope.   Past Medical History:  Diagnosis Date   Coronary artery disease    S/P stent Summer 2008, January 2016 left main 30% stenosis, LAD mid 50% stenosis, patent stent in the mid right coronary artery with ectasia  proximal and distal to the stent. EF mildly reduced at 40% with global hypokinesis.   Diabetes mellitus (HCC)    A1c 6.1 (11-2008)   Erectile dysfunction    Hyperlipidemia    Hypertension    MI (mitral incompetence)    Myocardial infarction (HCC) 2016   Sleep apnea    URI (upper respiratory infection) 08/18/2011    Past Surgical History:  Procedure Laterality Date   COLONOSCOPY  2012   negative   COLONOSCOPY WITH PROPOFOL N/A 06/09/2020   Procedure: COLONOSCOPY WITH PROPOFOL;  Surgeon: Dolores Frame, MD;  Location: [name];  Service: Gastroenterology;  Laterality: N/A;  10:05   LEFT HEART CATH AND CORONARY ANGIOGRAPHY N/A 02/14/2017   Procedure: LEFT HEART CATH AND CORONARY ANGIOGRAPHY;  Surgeon: Lennette Bihari, MD;  [facility]: [facility];  Service: Cardiovascular;  Laterality: N/A;   LEFT HEART CATH AND CORONARY ANGIOGRAPHY N/A 10/29/2020   Procedure: LEFT HEART CATH AND CORONARY ANGIOGRAPHY;  Surgeon: Lyn Records, MD;  Location: [facility];  Service: Cardiovascular;  Laterality: N/A;   RIGHT HEART CATH N/A 10/29/2020   Procedure: RIGHT HEART CATH;  Surgeon: Lyn Records, MD;  Location: [facility] INVASIVE CV LAB;  Service: Cardiovascular;  Laterality: N/A;   WRIST SURGERY     fractured wrist, Dr.  Gramig     Current Outpatient Medications  Medication Sig Dispense Refill   aspirin EC 81 MG tablet Take 81 mg by mouth daily.     atorvastatin (LIPITOR) 40 MG tablet TAKE 1 TABLET BY MOUTH ONCE DAILY (Patient taking differently: Take 40 mg by mouth every evening.) 90 tablet 2   furosemide (LASIX) 20 MG tablet Take 20 mg by mouth daily.     lisinopril (ZESTRIL) 5 MG tablet Take 5 mg by mouth daily.     metFORMIN (GLUCOPHAGE) 500 MG tablet TAKE 1 TABLET BY MOUTH ONCE DAILY (Patient taking differently: Take 500 mg by mouth every evening.) 90 tablet 3   metoprolol succinate (TOPROL-XL) 50 MG 24 hr tablet Take 1 tablet (50 mg total) by mouth daily. Take with or  immediately following a meal. 90 tablet 3   nitroGLYCERIN (NITROSTAT) 0.4 MG SL tablet Place 1 tablet (0.4 mg total) under the tongue every 5 (five) minutes as needed. 25 tablet 1   potassium chloride (KLOR-CON) 10 MEQ tablet Take 10 mEq by mouth daily.     amLODipine (NORVASC) 5 MG tablet Take 1 tablet (5 mg total) by mouth daily. (Patient not taking: Reported on 01/20/2022) 30 tablet 0   No current facility-administered medications for this visit.    Allergies:   Patient has no known allergies.    ROS:  Please see the history of present illness.   Otherwise, review of systems are positive for none.   All other systems are reviewed and negative.    PHYSICAL EXAM: VS:  BP 136/68   Pulse 63   Ht 6' (1.829 m)   Wt 258 lb 9.6 oz (117.3 kg)   SpO2 96%   BMI 35.07 kg/m  , BMI Body mass index is 35.07 kg/m. GENERAL:  Well appearing NECK:  No jugular venous distention, waveform within normal limits, carotid upstroke brisk and symmetric, no bruits, no thyromegaly LUNGS:  Clear to auscultation bilaterally CHEST:  Unremarkable HEART:  PMI not displaced or sustained,S1 and S2 within normal limits, no S3, no S4, no clicks, no rubs, no murmurs ABD:  Flat, positive bowel sounds normal in frequency in pitch, no bruits, no rebound, no guarding, no midline pulsatile mass, no hepatomegaly, no splenomegaly EXT:  2 plus pulses throughout, no edema, no cyanosis no clubbing    EKG:  EKG is not ordered today.    Recent Labs: No results found for requested labs within last 365 days.    Lipid Panel    Component Value Date/Time   CHOL 130 04/11/2016 0921   TRIG 122 04/11/2016 0921   HDL 37 (L) 04/11/2016 0921   CHOLHDL 3.5 04/11/2016 0921   CHOLHDL 4 08/22/2010 1101   VLDL 24.8 08/22/2010 1101   LDLCALC 69 04/11/2016 0921      Wt Readings from Last 3 Encounters:  01/20/22 258 lb 9.6 oz (117.3 kg)  06/08/21 260 lb (117.9 kg)  10/31/20 230 lb 1.6 oz (104.4 kg)    CATH   Widely patent  left main Segmental mid 40 to 60% LAD.  First diagonal 60% ostial. Circumflex is relatively small and without any significant disease. Dominant right coronary.  Widely patent mid RCA stent.  Ectatic RCA beyond stent with 30% narrowing. Mid anterior wall severe hypokinesis.  LVEF 40 to 50%.  LVEDP 15 mmHg. Normal right heart pressures Hypoxia despite high flow oxygen, uncertain etiology.   RECOMMENDATIONS:   MINOCA with possible atypical stress-induced cardiomyopathy Supportive therapy. Resume beta blocker. Pulmonary embolism  has not been excluded but seems less likely. Will notify pulmonary medicine the patient will be kept at [facility] and that there continued management is needed.  Diagnostic Dominance: Right Intervention  Other studies Reviewed: Additional studies/ records that were reviewed today include: None Review of the above records demonstrates:  Please see elsewhere in the note.     ASSESSMENT AND PLAN:   CAD :   The patient has no new sypmtoms.  No further cardiovascular testing is indicated.  We will continue with aggressive risk reduction and meds as listed.   Cardiomyopathy:   This resolved.  No change in therapy.   Mitral Regurgitation:    This was mild and I will follow this clinically.   Hypertension: His blood pressure is mildly elevated.  He will keep a blood pressure diary.  I will titrate meds as needed.   Hyperlipidemia: LDL was 70 with an HDL of 35.  No change in therapy.   Type 2 Diabetes Mellitus:   A1c was 6.5.  No change in therapy.   Obstructive Sleep Apnea: He wears a CPAP.  No change in therapy.  Current medicines are reviewed at length with the patient today.  The patient does not have concerns regarding medicines.  The following changes have been made:  None  Labs/ tests ordered today include:  None  No orders of the defined types were placed in this encounter.    Disposition:   FU with me in 12 month    Signed, Minus Breeding, MD  01/20/2022 11:30 AM    [facility]

## 2022-01-20 ENCOUNTER — Ambulatory Visit: Payer: Medicare Other | Attending: Cardiology | Admitting: Cardiology

## 2022-01-20 ENCOUNTER — Encounter: Payer: Self-pay | Admitting: Cardiology

## 2022-01-20 VITALS — BP 136/68 | HR 63 | Ht 72.0 in | Wt 258.6 lb

## 2022-01-20 DIAGNOSIS — E118 Type 2 diabetes mellitus with unspecified complications: Secondary | ICD-10-CM

## 2022-01-20 DIAGNOSIS — I340 Nonrheumatic mitral (valve) insufficiency: Secondary | ICD-10-CM | POA: Diagnosis not present

## 2022-01-20 DIAGNOSIS — G4733 Obstructive sleep apnea (adult) (pediatric): Secondary | ICD-10-CM

## 2022-01-20 DIAGNOSIS — I2510 Atherosclerotic heart disease of native coronary artery without angina pectoris: Secondary | ICD-10-CM | POA: Diagnosis not present

## 2022-01-20 DIAGNOSIS — E785 Hyperlipidemia, unspecified: Secondary | ICD-10-CM

## 2022-01-20 NOTE — Patient Instructions (Signed)
Medication Instructions:  NO CHANGES *If you need a refill on your cardiac medications before your next appointment, please call your pharmacy*  Follow-Up: At [facility], you and your health needs are our priority.  As part of our continuing mission to provide you with exceptional heart care, we have created designated Provider Care Teams.  These Care Teams include your primary Cardiologist (physician) and Advanced Practice Providers (APPs -  Physician Assistants and Nurse Practitioners) who all work together to provide you with the care you need, when you need it.  We recommend signing up for the patient portal called "MyChart".  Sign up information is provided on this After Visit Summary.  MyChart is used to connect with patients for Virtual Visits (Telemedicine).  Patients are able to view lab/test results, encounter notes, upcoming appointments, etc.  Non-urgent messages can be sent to your provider as well.   To learn more about what you can do with MyChart, go to [email or web address].    Your next appointment:   1 year(s)  Provider:   Minus Breeding, MD     Other Instructions BP LOG: CHECK BP TWICE A DAY OVER 2 WEEKS AND NOTIFY us OF BP'S

## 2022-01-26 DIAGNOSIS — I2510 Atherosclerotic heart disease of native coronary artery without angina pectoris: Secondary | ICD-10-CM | POA: Diagnosis not present

## 2022-01-26 DIAGNOSIS — E1122 Type 2 diabetes mellitus with diabetic chronic kidney disease: Secondary | ICD-10-CM | POA: Diagnosis not present

## 2022-01-26 DIAGNOSIS — K649 Unspecified hemorrhoids: Secondary | ICD-10-CM | POA: Diagnosis not present

## 2022-01-26 DIAGNOSIS — G4730 Sleep apnea, unspecified: Secondary | ICD-10-CM | POA: Diagnosis not present

## 2022-01-26 DIAGNOSIS — R0600 Dyspnea, unspecified: Secondary | ICD-10-CM | POA: Diagnosis not present

## 2022-01-26 DIAGNOSIS — E782 Mixed hyperlipidemia: Secondary | ICD-10-CM | POA: Diagnosis not present

## 2022-01-26 DIAGNOSIS — Z87891 Personal history of nicotine dependence: Secondary | ICD-10-CM | POA: Diagnosis not present

## 2022-01-26 DIAGNOSIS — Z0000 Encounter for general adult medical examination without abnormal findings: Secondary | ICD-10-CM | POA: Diagnosis not present

## 2022-01-26 DIAGNOSIS — I5022 Chronic systolic (congestive) heart failure: Secondary | ICD-10-CM | POA: Diagnosis not present

## 2022-01-26 DIAGNOSIS — I130 Hypertensive heart and chronic kidney disease with heart failure and stage 1 through stage 4 chronic kidney disease, or unspecified chronic kidney disease: Secondary | ICD-10-CM | POA: Diagnosis not present

## 2022-01-26 DIAGNOSIS — N1831 Chronic kidney disease, stage 3a: Secondary | ICD-10-CM | POA: Diagnosis not present

## 2022-04-09 IMAGING — DX DG CHEST 1V PORT
1 series · 1 of 1 positions shown · non-contrast
Comparison: 10/27/2020 at [DATE] a.m.

CLINICAL DATA: Endotracheally intubated.

EXAM:
PORTABLE CHEST 1 VIEW

[chest ap]
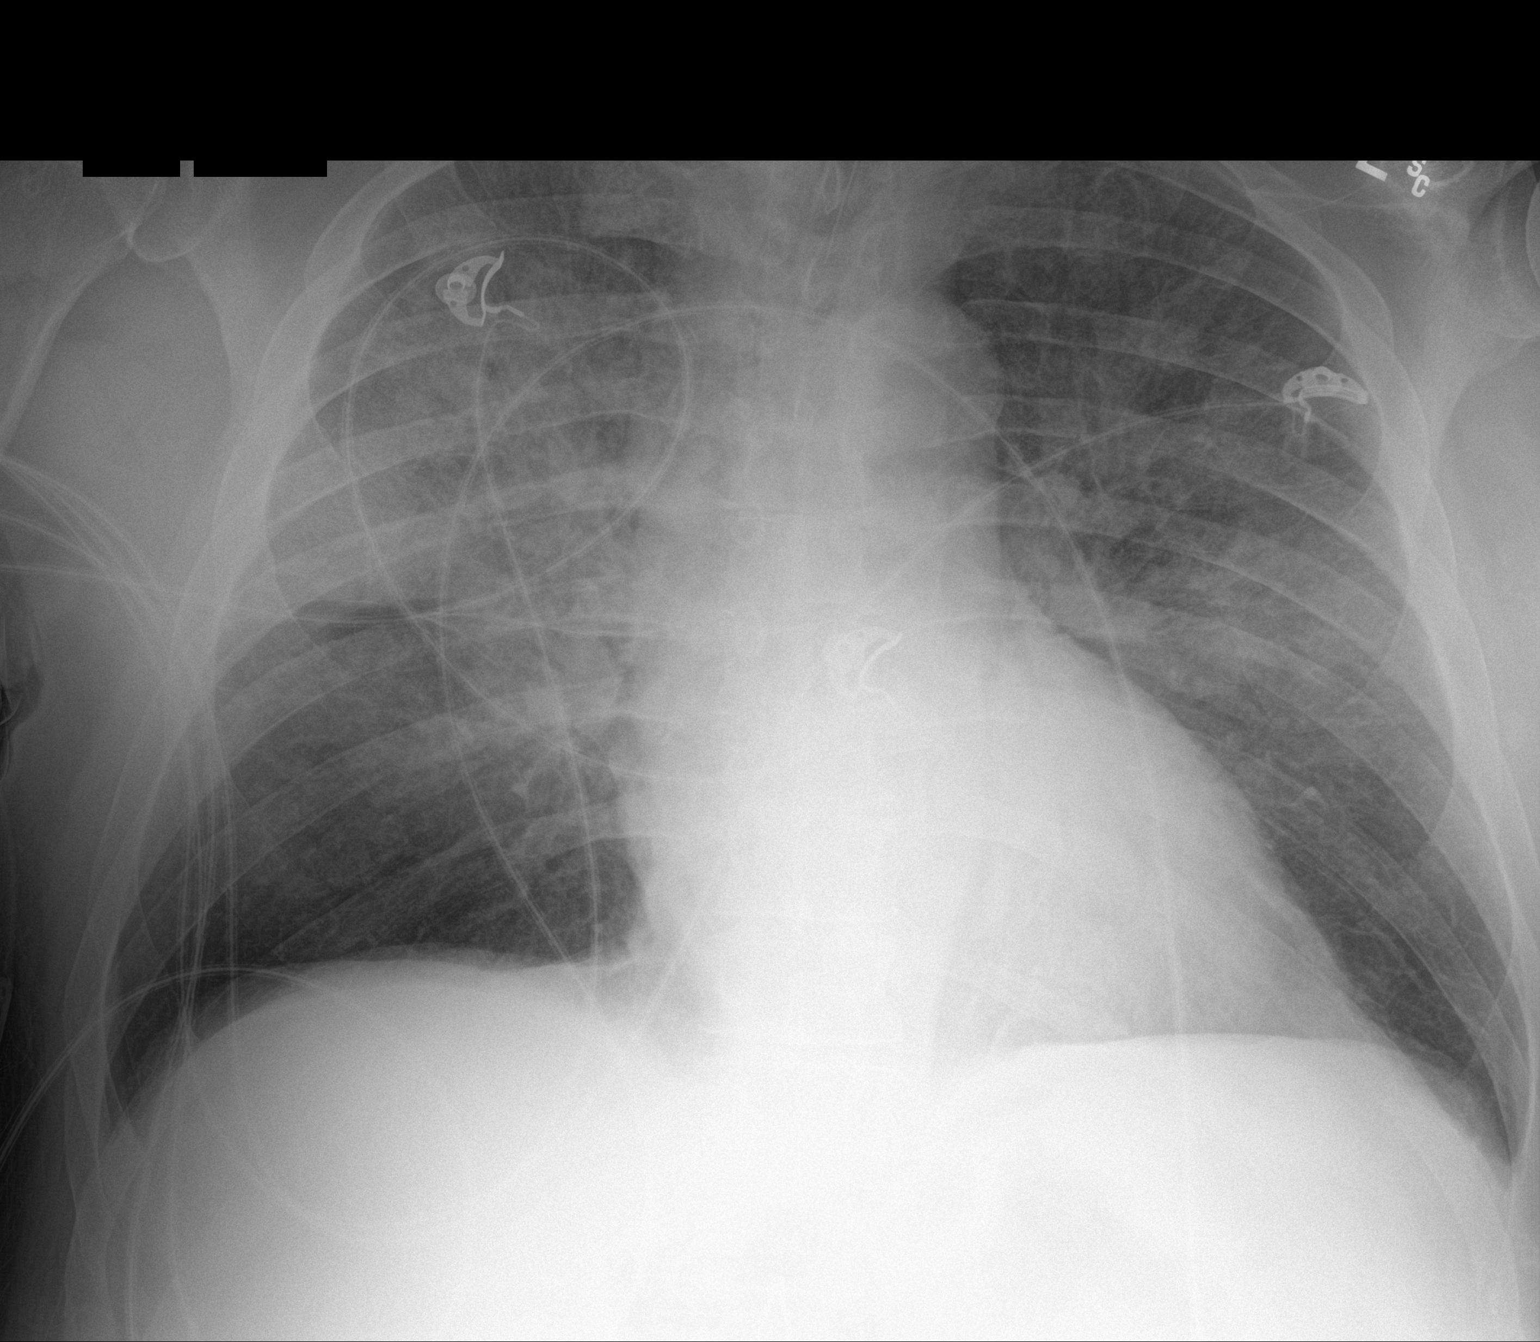

[1 of 1 positions shown; findings below may reference images not displayed]

FINDINGS: An endotracheal tube remains in place terminating 5.5 cm above the
carina. The cardiomediastinal silhouette is within normal limits for
portable AP technique. Dense bilateral airspace opacities on the
prior study demonstrate partial interval clearing. Remaining
symmetric opacities are greatest in the perihilar regions. No
sizable pleural effusion or pneumothorax is identified.
IMPRESSION: Interval improvement of widespread airspace opacities which could
reflect decreasing edema.

## 2022-04-10 IMAGING — DX DG CHEST 1V PORT
1 series · 1 of 1 positions shown · non-contrast
Comparison: Film from previous day.

CLINICAL DATA: Check endotracheal tube placement

EXAM:
PORTABLE CHEST 1 VIEW

[chest ap]
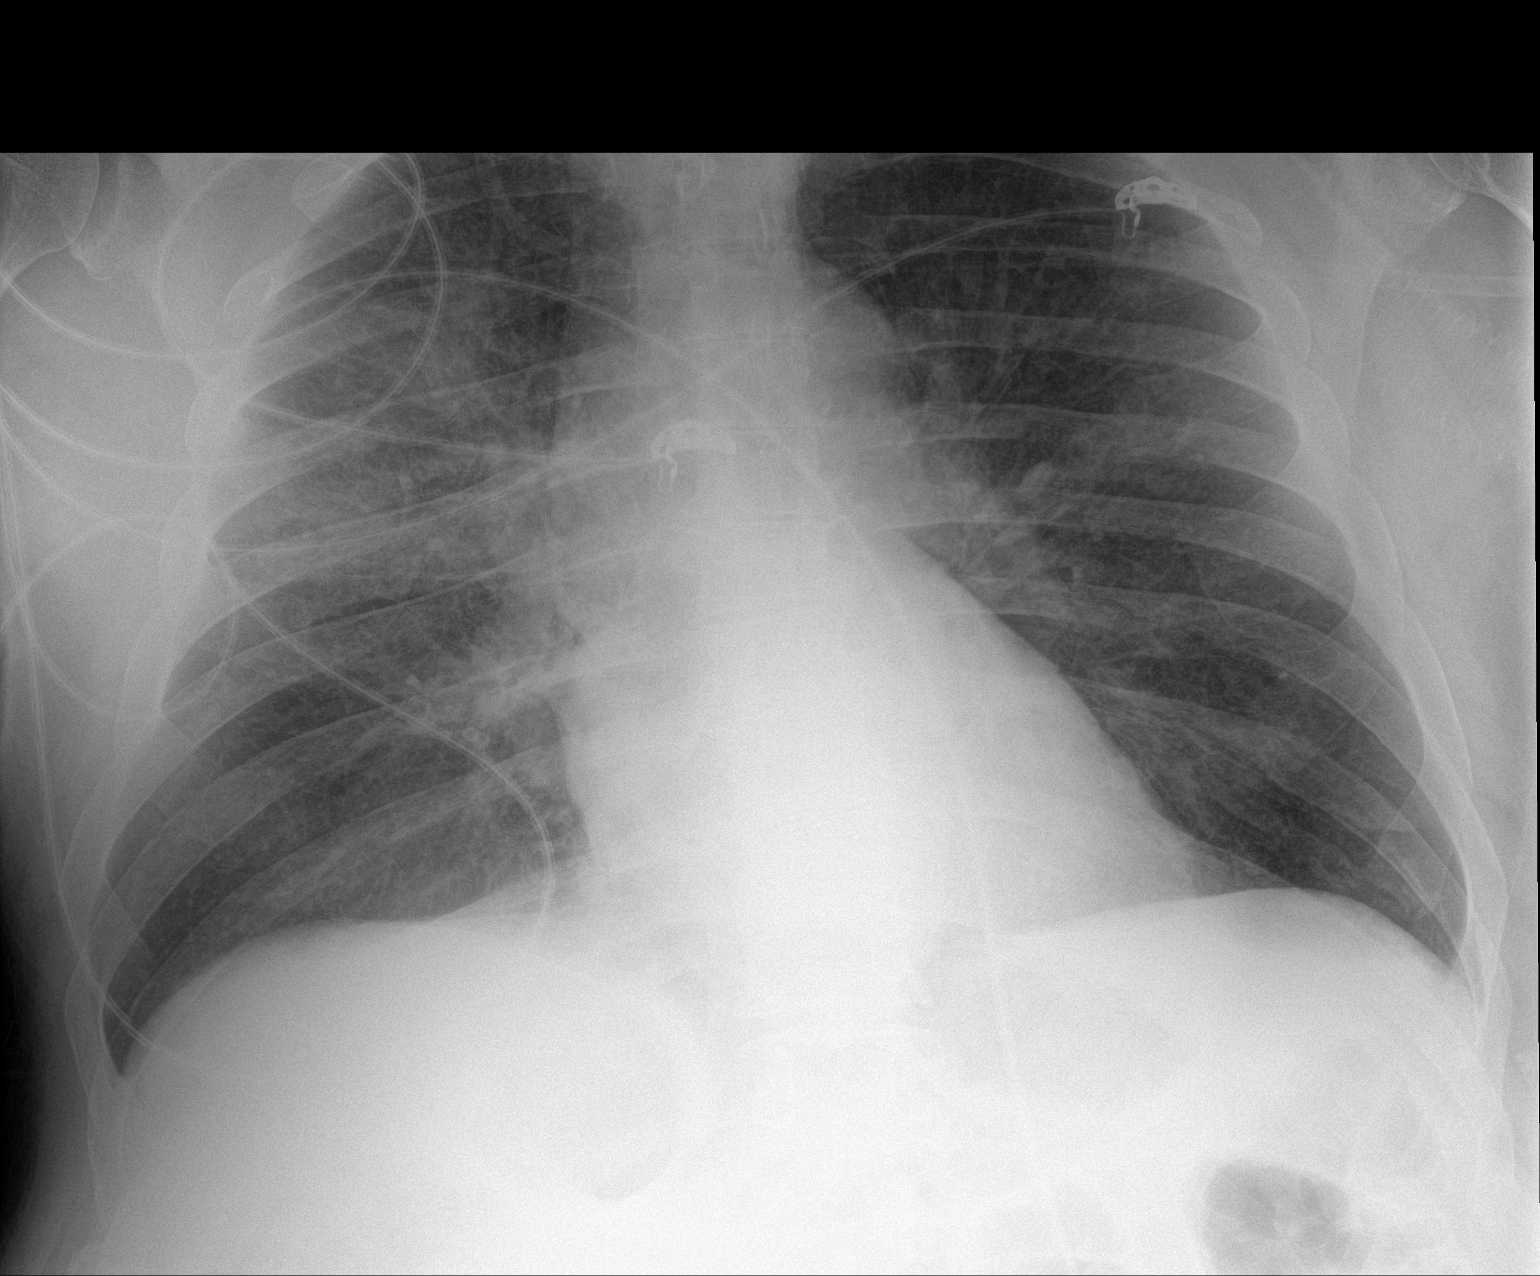

[1 of 1 positions shown; findings below may reference images not displayed]

FINDINGS: Endotracheal tube is noted in satisfactory position. No cardiomegaly
is seen. The lungs are well aerated bilaterally. Improved aeration
is noted bilaterally although persistent airspace opacities are
seen.
IMPRESSION: Improved aeration bilaterally.  No new focal abnormality is noted.

## 2022-04-10 IMAGING — DX DG CHEST 1V PORT
1 series · 1 of 1 positions shown · non-contrast
Comparison: Same day.

CLINICAL DATA: Chest pain.

EXAM:
PORTABLE CHEST 1 VIEW

[chest ap]
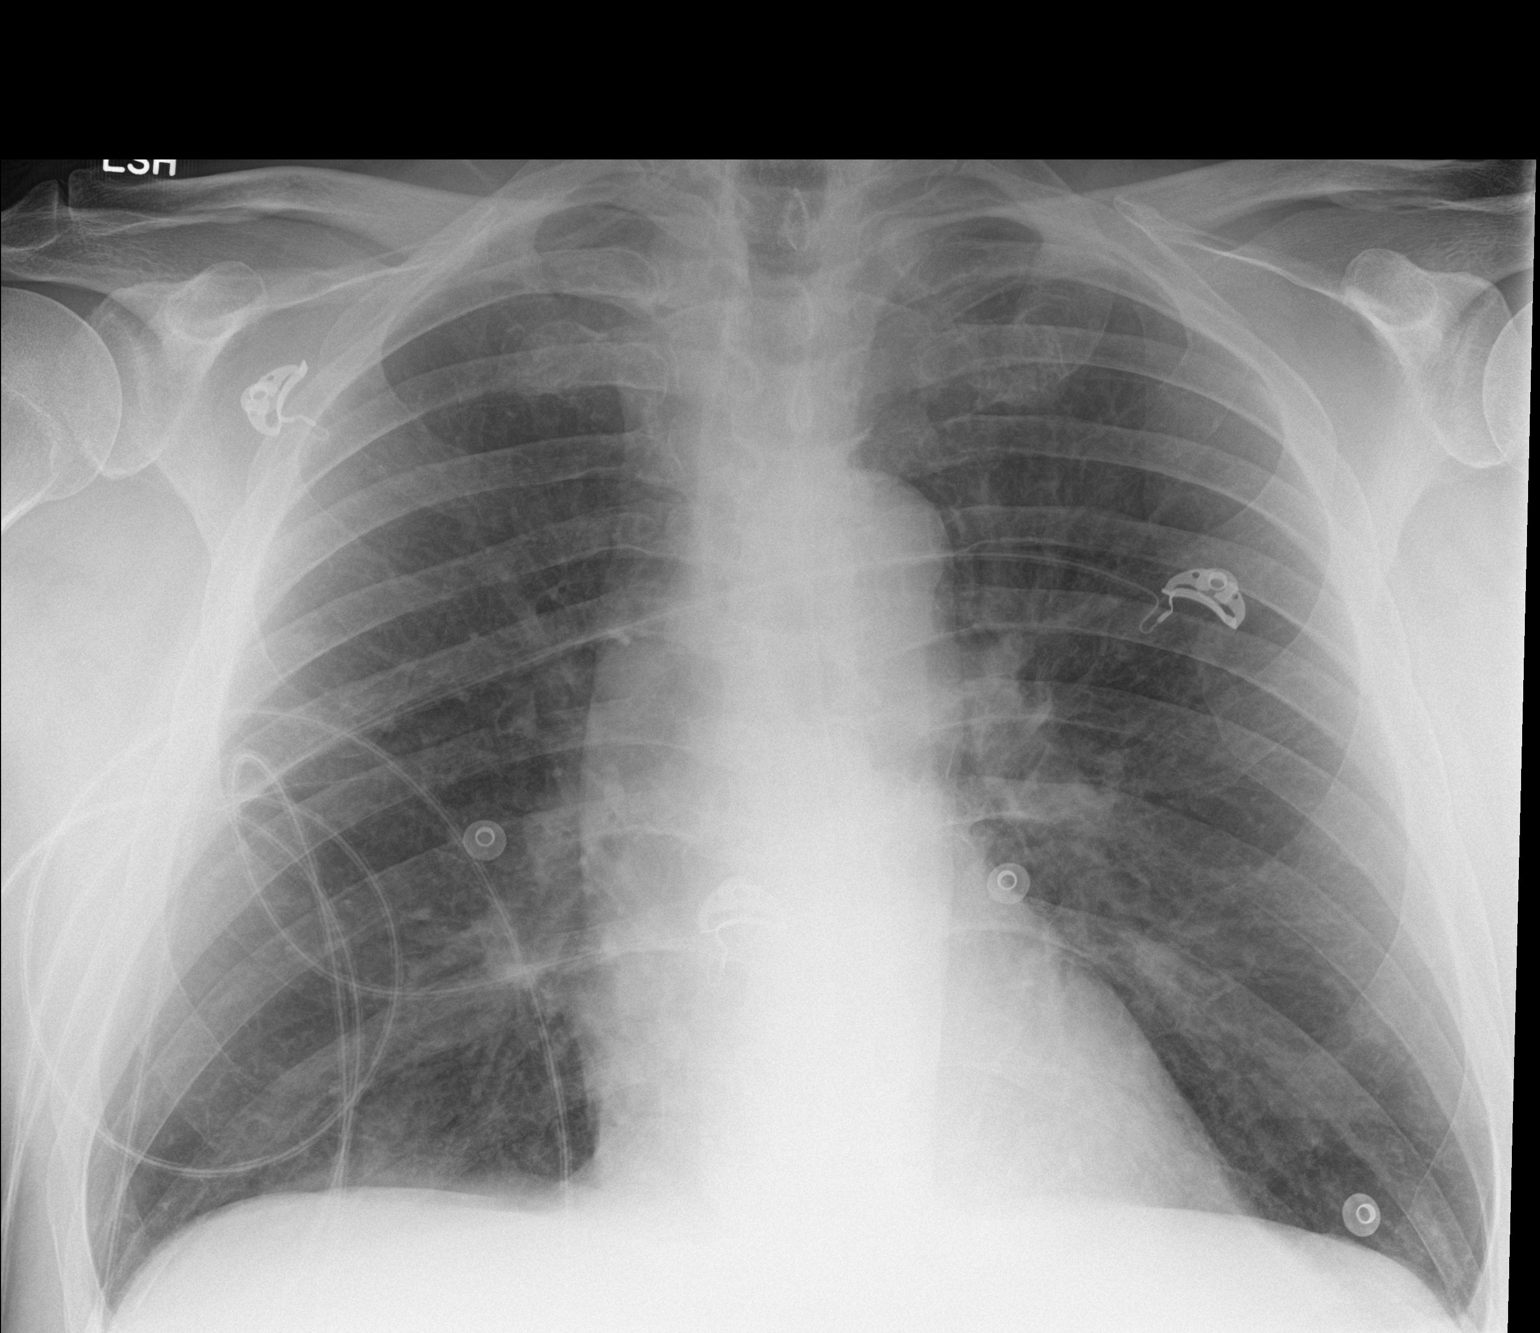

[1 of 1 positions shown; findings below may reference images not displayed]

FINDINGS: The heart size and mediastinal contours are within normal limits.
Both lungs are clear. The visualized skeletal structures are
unremarkable.
IMPRESSION: No active disease.

## 2022-04-12 IMAGING — DX DG CHEST 1V PORT
1 series · 1 of 1 positions shown · non-contrast
Comparison: 10/29/2020

CLINICAL DATA: Follow-up pulmonary edema

EXAM:
PORTABLE CHEST 1 VIEW

[chest ap]
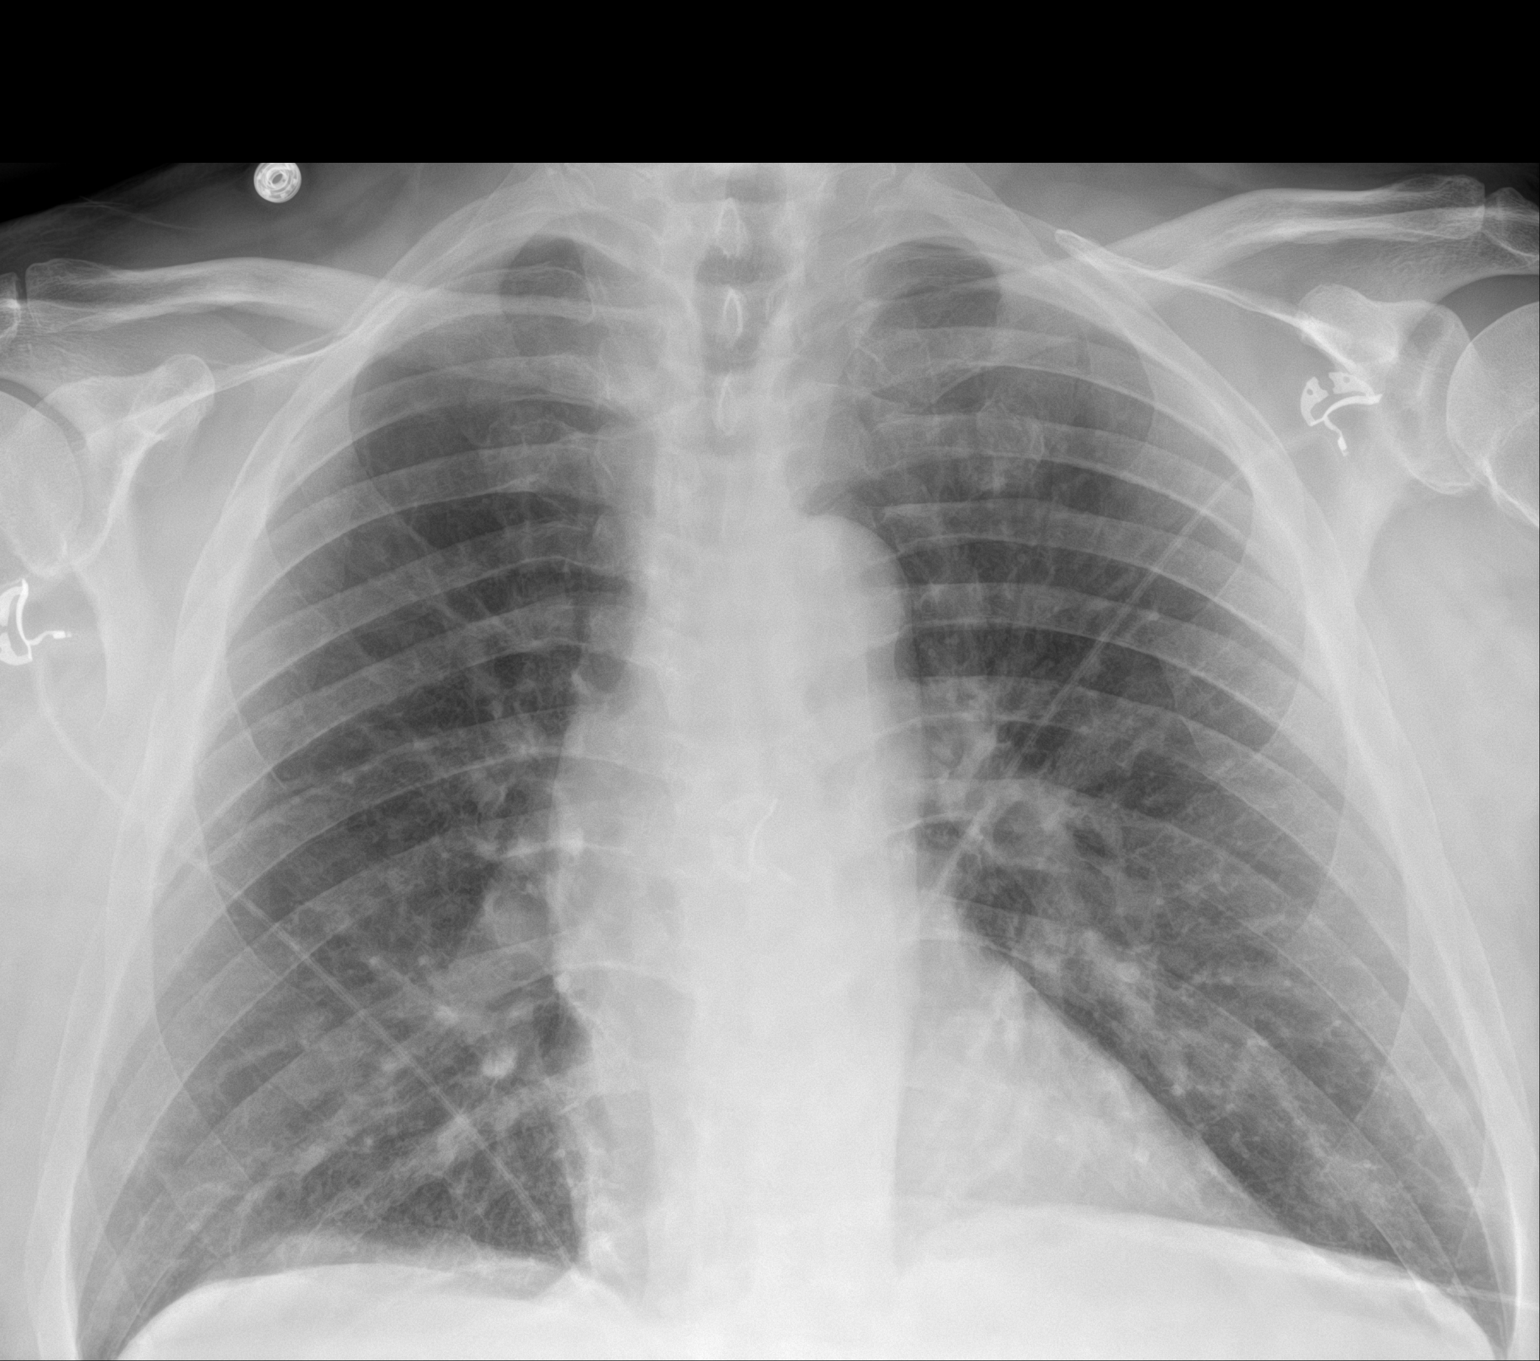

[1 of 1 positions shown; findings below may reference images not displayed]

FINDINGS: Heart size is normal. Mediastinal shadows are normal. The lungs are
clear. There may be mild pulmonary venous hypertension but there is
no frank edema. No visible effusion. No bone abnormality.
IMPRESSION: No definite active process. Possible mild venous hypertension but no
frank edema.

## 2022-06-02 DIAGNOSIS — E782 Mixed hyperlipidemia: Secondary | ICD-10-CM | POA: Diagnosis not present

## 2022-06-02 DIAGNOSIS — E1122 Type 2 diabetes mellitus with diabetic chronic kidney disease: Secondary | ICD-10-CM | POA: Diagnosis not present

## 2022-06-09 DIAGNOSIS — M19041 Primary osteoarthritis, right hand: Secondary | ICD-10-CM | POA: Diagnosis not present

## 2022-06-09 DIAGNOSIS — M19031 Primary osteoarthritis, right wrist: Secondary | ICD-10-CM | POA: Diagnosis not present

## 2022-06-09 DIAGNOSIS — R0600 Dyspnea, unspecified: Secondary | ICD-10-CM | POA: Diagnosis not present

## 2022-06-09 DIAGNOSIS — N1831 Chronic kidney disease, stage 3a: Secondary | ICD-10-CM | POA: Diagnosis not present

## 2022-06-09 DIAGNOSIS — I5022 Chronic systolic (congestive) heart failure: Secondary | ICD-10-CM | POA: Diagnosis not present

## 2022-06-09 DIAGNOSIS — I130 Hypertensive heart and chronic kidney disease with heart failure and stage 1 through stage 4 chronic kidney disease, or unspecified chronic kidney disease: Secondary | ICD-10-CM | POA: Diagnosis not present

## 2022-06-09 DIAGNOSIS — E1122 Type 2 diabetes mellitus with diabetic chronic kidney disease: Secondary | ICD-10-CM | POA: Diagnosis not present

## 2022-06-09 DIAGNOSIS — G4739 Other sleep apnea: Secondary | ICD-10-CM | POA: Diagnosis not present

## 2022-06-09 DIAGNOSIS — E782 Mixed hyperlipidemia: Secondary | ICD-10-CM | POA: Diagnosis not present

## 2022-06-09 DIAGNOSIS — I10 Essential (primary) hypertension: Secondary | ICD-10-CM | POA: Diagnosis not present

## 2022-06-09 DIAGNOSIS — M19042 Primary osteoarthritis, left hand: Secondary | ICD-10-CM | POA: Diagnosis not present

## 2022-07-11 DIAGNOSIS — N1831 Chronic kidney disease, stage 3a: Secondary | ICD-10-CM | POA: Diagnosis not present

## 2022-08-02 DIAGNOSIS — N184 Chronic kidney disease, stage 4 (severe): Secondary | ICD-10-CM | POA: Diagnosis not present

## 2022-08-02 DIAGNOSIS — G4733 Obstructive sleep apnea (adult) (pediatric): Secondary | ICD-10-CM | POA: Diagnosis not present

## 2022-09-25 DIAGNOSIS — I2510 Atherosclerotic heart disease of native coronary artery without angina pectoris: Secondary | ICD-10-CM | POA: Diagnosis not present

## 2022-09-25 DIAGNOSIS — G4733 Obstructive sleep apnea (adult) (pediatric): Secondary | ICD-10-CM | POA: Diagnosis not present

## 2022-09-25 DIAGNOSIS — I5022 Chronic systolic (congestive) heart failure: Secondary | ICD-10-CM | POA: Diagnosis not present

## 2022-09-25 DIAGNOSIS — N1832 Chronic kidney disease, stage 3b: Secondary | ICD-10-CM | POA: Diagnosis not present

## 2022-10-09 ENCOUNTER — Other Ambulatory Visit (HOSPITAL_COMMUNITY): Payer: Self-pay | Admitting: Nephrology

## 2022-10-09 DIAGNOSIS — N1831 Chronic kidney disease, stage 3a: Secondary | ICD-10-CM

## 2022-10-25 ENCOUNTER — Ambulatory Visit (HOSPITAL_COMMUNITY)
Admission: RE | Admit: 2022-10-25 | Discharge: 2022-10-25 | Disposition: A | Discharge status: Home / Self Care | Payer: Medicare Other | Source: Ambulatory Visit | Attending: Nephrology | Admitting: Nephrology

## 2022-10-25 DIAGNOSIS — I2510 Atherosclerotic heart disease of native coronary artery without angina pectoris: Secondary | ICD-10-CM | POA: Diagnosis not present

## 2022-10-25 DIAGNOSIS — N1832 Chronic kidney disease, stage 3b: Secondary | ICD-10-CM | POA: Diagnosis not present

## 2022-10-25 DIAGNOSIS — I5022 Chronic systolic (congestive) heart failure: Secondary | ICD-10-CM | POA: Diagnosis not present

## 2022-10-25 DIAGNOSIS — N1831 Chronic kidney disease, stage 3a: Secondary | ICD-10-CM | POA: Diagnosis not present

## 2022-10-25 DIAGNOSIS — G4733 Obstructive sleep apnea (adult) (pediatric): Secondary | ICD-10-CM | POA: Diagnosis not present

## 2022-10-25 DIAGNOSIS — N189 Chronic kidney disease, unspecified: Secondary | ICD-10-CM | POA: Diagnosis not present

## 2022-11-09 DIAGNOSIS — N1831 Chronic kidney disease, stage 3a: Secondary | ICD-10-CM | POA: Diagnosis not present

## 2022-11-09 DIAGNOSIS — E1122 Type 2 diabetes mellitus with diabetic chronic kidney disease: Secondary | ICD-10-CM | POA: Diagnosis not present

## 2022-11-09 DIAGNOSIS — G4733 Obstructive sleep apnea (adult) (pediatric): Secondary | ICD-10-CM | POA: Diagnosis not present

## 2022-11-09 DIAGNOSIS — I129 Hypertensive chronic kidney disease with stage 1 through stage 4 chronic kidney disease, or unspecified chronic kidney disease: Secondary | ICD-10-CM | POA: Diagnosis not present

## 2022-11-29 DIAGNOSIS — E1122 Type 2 diabetes mellitus with diabetic chronic kidney disease: Secondary | ICD-10-CM | POA: Diagnosis not present

## 2022-11-29 DIAGNOSIS — E782 Mixed hyperlipidemia: Secondary | ICD-10-CM | POA: Diagnosis not present

## 2022-11-30 LAB — LAB REPORT - SCANNED
A1c: 6.4
Albumin, Urine POC: 3
Creatinine, POC: 75.3 mg/dL
EGFR: 57
Microalb Creat Ratio: 4

## 2022-12-05 DIAGNOSIS — M1990 Unspecified osteoarthritis, unspecified site: Secondary | ICD-10-CM | POA: Diagnosis not present

## 2022-12-05 DIAGNOSIS — Z23 Encounter for immunization: Secondary | ICD-10-CM | POA: Diagnosis not present

## 2022-12-05 DIAGNOSIS — R0600 Dyspnea, unspecified: Secondary | ICD-10-CM | POA: Diagnosis not present

## 2022-12-05 DIAGNOSIS — I2510 Atherosclerotic heart disease of native coronary artery without angina pectoris: Secondary | ICD-10-CM | POA: Diagnosis not present

## 2022-12-05 DIAGNOSIS — K649 Unspecified hemorrhoids: Secondary | ICD-10-CM | POA: Diagnosis not present

## 2022-12-05 DIAGNOSIS — G4730 Sleep apnea, unspecified: Secondary | ICD-10-CM | POA: Diagnosis not present

## 2022-12-05 DIAGNOSIS — I10 Essential (primary) hypertension: Secondary | ICD-10-CM | POA: Diagnosis not present

## 2022-12-05 DIAGNOSIS — I5022 Chronic systolic (congestive) heart failure: Secondary | ICD-10-CM | POA: Diagnosis not present

## 2022-12-05 DIAGNOSIS — I110 Hypertensive heart disease with heart failure: Secondary | ICD-10-CM | POA: Diagnosis not present

## 2022-12-05 DIAGNOSIS — N1831 Chronic kidney disease, stage 3a: Secondary | ICD-10-CM | POA: Diagnosis not present

## 2022-12-05 DIAGNOSIS — E1122 Type 2 diabetes mellitus with diabetic chronic kidney disease: Secondary | ICD-10-CM | POA: Diagnosis not present

## 2022-12-05 DIAGNOSIS — E782 Mixed hyperlipidemia: Secondary | ICD-10-CM | POA: Diagnosis not present

## 2023-01-15 NOTE — Progress Notes (Signed)
 Cardiology Office Note:   Date:  01/18/2023  ID:  [name] [name], DOB [birth year], MRN [id number] PCP: Shona Norleen PEDLAR, MD  [facility] Providers Cardiologist:  Lynwood Schilling, MD {  History of Present Illness:   [name] [name] is a 73 y.o. male who presents for follow up of CAD.  He has a history of a remote BMS to RCA in 2008 in the setting of MI. Most recent Echo in 04/2016 showed LVEF of 45-50% with hypokinesis of the inferior myocardium and mild MR. Myoview  was ordered in 01/2017 for further evaluation of dyspnea on exertion and was abnormal. Therefore, patient underwent LHC in 02/2017 which showed mild non-obstructive CAD with widely patent stent in RCA. Patient also has obstructive sleep apnea and is on BIPAP. This is managed by Dr. Burnard.  He had hemorrhoidectomy in October 2023 which was uncomplicated; he was prone for around 1.5 hrs.  He was extubated in PACU but developed respiratory distress, became hypoxic with pink frothy secretions flash pulmonary edema.  He was reintubated by anesthesia with CXR showing pulmonary edema.   Cath suggest that his EF was slightly low with some anterior hypokinesis but his ejection fraction was well-preserved on the follow-up echo.  He returns for follow up.     Since I last saw him he has done well.  He does some walking for exercise.  He might get a little dyspneic walking up a hill but this is not different than previous. The patient denies any new symptoms such as chest discomfort, neck or arm discomfort. There has been no new shortness of breath, PND or orthopnea. There have been no reported palpitations, presyncope or syncope.   ROS: As stated in the HPI and negative for all other systems.  Studies Reviewed:    EKG:   EKG Interpretation Date/Time:  Thursday January 18 2023 10:01:54 EST Ventricular Rate:  52 PR Interval:  156 QRS Duration:  78 QT Interval:  464 QTC Calculation: 431 R Axis:   55  Text Interpretation: Sinus bradycardia When  compared with ECG of 28-Oct-2020 14:39, QT has shortened Confirmed by Schilling Lynwood ([id number]) on 01/18/2023 10:23:11 AM    Risk Assessment/Calculations:      Physical Exam:   VS:  BP (!) 144/72 (BP Location: Left Arm, Patient Position: Sitting, Cuff Size: Normal)   Pulse (!) 52   Ht 6' (1.829 m)   Wt 251 lb (113.9 kg)   BMI 34.04 kg/m    Wt Readings from Last 3 Encounters:  01/18/23 251 lb (113.9 kg)  01/20/22 258 lb 9.6 oz (117.3 kg)  06/08/21 260 lb (117.9 kg)     GEN: Well nourished, well developed in no acute distress NECK: No JVD; No carotid bruits CARDIAC: RRR, no murmurs, rubs, gallops RESPIRATORY:  Clear to auscultation without rales, wheezing or rhonchi  ABDOMEN: Soft, non-tender, non-distended EXTREMITIES:  No edema; No deformity   ASSESSMENT AND PLAN:   CAD :  The patient has no new sypmtoms.  No further cardiovascular testing is indicated.  We will continue with aggressive risk reduction and meds as listed.   Cardiomyopathy:   This was improved on the echo in 2022 and I do not see any reason for follow-up imaging at this point.  No change in therapy.  Mitral Regurgitation:    This was mild previously.  I do not suspect any worsening of this by exam.  No change in therapy.   Hypertension: His blood pressure is controlled.  No  change in therapy.    Hyperlipidemia: LDL was 63 with an HDL of 36.  No change in therapy.    Type 2 Diabetes Mellitus:   A1c was 6.4 which is down a little bit from previous.  No change in therapy   obstructive Sleep Apnea: He wears a CPAP.    Follow up patient will follow up with me in 1 year.  Signed, Lynwood Schilling, MD

## 2023-01-18 ENCOUNTER — Ambulatory Visit: Payer: Medicare Other | Attending: Cardiology | Admitting: Cardiology

## 2023-01-18 ENCOUNTER — Encounter: Payer: Self-pay | Admitting: Cardiology

## 2023-01-18 VITALS — BP 144/72 | HR 52 | Ht 72.0 in | Wt 251.0 lb

## 2023-01-18 DIAGNOSIS — E118 Type 2 diabetes mellitus with unspecified complications: Secondary | ICD-10-CM

## 2023-01-18 DIAGNOSIS — I10 Essential (primary) hypertension: Secondary | ICD-10-CM | POA: Diagnosis not present

## 2023-01-18 DIAGNOSIS — I25119 Atherosclerotic heart disease of native coronary artery with unspecified angina pectoris: Secondary | ICD-10-CM | POA: Diagnosis not present

## 2023-01-18 DIAGNOSIS — E785 Hyperlipidemia, unspecified: Secondary | ICD-10-CM

## 2023-01-18 DIAGNOSIS — I340 Nonrheumatic mitral (valve) insufficiency: Secondary | ICD-10-CM | POA: Diagnosis not present

## 2023-01-18 NOTE — Patient Instructions (Signed)
 Medication Instructions:  No changes.  *If you need a refill on your cardiac medications before your next appointment, please call your pharmacy*   Follow-Up: At [facility], you and your health needs are our priority.  As part of our continuing mission to provide you with exceptional heart care, we have created designated Provider Care Teams.  These Care Teams include your primary Cardiologist (physician) and Advanced Practice Providers (APPs -  Physician Assistants and Nurse Practitioners) who all work together to provide you with the care you need, when you need it.  We recommend signing up for the patient portal called "MyChart".  Sign up information is provided on this After Visit Summary.  MyChart is used to connect with patients for Virtual Visits (Telemedicine).  Patients are able to view lab/test results, encounter notes, upcoming appointments, etc.  Non-urgent messages can be sent to your provider as well.   To learn more about what you can do with MyChart, go to [email or web address].    Your next appointment:   12 month(s)  Provider:   Rollene Rotunda, MD

## 2023-01-31 DIAGNOSIS — M180 Bilateral primary osteoarthritis of first carpometacarpal joints: Secondary | ICD-10-CM | POA: Diagnosis not present

## 2023-02-01 ENCOUNTER — Telehealth: Payer: Self-pay | Admitting: *Deleted

## 2023-02-01 NOTE — Telephone Encounter (Signed)
   Pre-operative Risk Assessment    Patient Name: [name] [name]  DOB: [birth year] MRN: [id number]   Date of last office visit: 01/18/23 DR. HOCHREIN Date of next office visit: NONE   Request for Surgical Clearance    Procedure:  RIGHT THUMB [facility] ARTHROPLASTY WITH DOUBLE TENDON TRANSFER AND FIBER LOCK SUSPENSION AND REPAIR AS NECESSARY  Date of Surgery:  Clearance TBD                                Surgeon:  DR. Dominica Severin Surgeon's Group or Practice Name:  [facility] Phone number:  [phone] Fax number:  [phone] ATTN: Cordelia Pen WILLS   Type of Clearance Requested:   - Medical  - Pharmacy:  Hold Aspirin     Type of Anesthesia:   ANESTHESIA-BLOCK WITH IV SEDATION   Additional requests/questions:    Elpidio Anis   02/01/2023, 10:50 AM

## 2023-02-01 NOTE — Telephone Encounter (Signed)
Good Morning Dr. Antoine Poche  We have received a surgical clearance request for [name] [name] for upcoming right thumb [facility] arthroplasty. They were seen recently in clinic on 01/18/2023.  He has a PMH of CAD s/p MI and BMS to RCA in 2008, HTN, HLD, OSA (on BiPAP).  Can you please comment on surgical clearance for his upcoming thumb procedure. Please forward you guidance and recommendations to P CV DIV PREOP   Thanks, Robin Searing, NP

## 2023-02-02 NOTE — Telephone Encounter (Signed)
   Patient Name: [name] [name]  DOB: 1950-12-13 MRN: [id number]  Primary Cardiologist: Rollene Rotunda, MD  Chart reviewed as part of pre-operative protocol coverage. Given past medical history and time since last visit, based on ACC/AHA guidelines, [name] is at acceptable risk for the planned procedure without further cardiovascular testing.   The patient was advised that if he develops new symptoms prior to surgery to contact our office to arrange for a follow-up visit, and he verbalized understanding.  Regarding ASA therapy, we recommend continuation of ASA throughout the perioperative period.    I will route this recommendation to the requesting party via Epic fax function and remove from pre-op pool.  Please call with questions.  Napoleon Form, Leodis Rains, NP 02/02/2023, 6:57 AM

## 2023-02-22 DIAGNOSIS — R944 Abnormal results of kidney function studies: Secondary | ICD-10-CM | POA: Diagnosis not present

## 2023-03-01 DIAGNOSIS — I129 Hypertensive chronic kidney disease with stage 1 through stage 4 chronic kidney disease, or unspecified chronic kidney disease: Secondary | ICD-10-CM | POA: Diagnosis not present

## 2023-03-01 DIAGNOSIS — I5022 Chronic systolic (congestive) heart failure: Secondary | ICD-10-CM | POA: Diagnosis not present

## 2023-03-01 DIAGNOSIS — G4733 Obstructive sleep apnea (adult) (pediatric): Secondary | ICD-10-CM | POA: Diagnosis not present

## 2023-03-01 DIAGNOSIS — E1122 Type 2 diabetes mellitus with diabetic chronic kidney disease: Secondary | ICD-10-CM | POA: Diagnosis not present

## 2023-06-06 DIAGNOSIS — E1122 Type 2 diabetes mellitus with diabetic chronic kidney disease: Secondary | ICD-10-CM | POA: Diagnosis not present

## 2023-06-06 DIAGNOSIS — E782 Mixed hyperlipidemia: Secondary | ICD-10-CM | POA: Diagnosis not present

## 2023-06-12 DIAGNOSIS — I5022 Chronic systolic (congestive) heart failure: Secondary | ICD-10-CM | POA: Diagnosis not present

## 2023-06-12 DIAGNOSIS — N1831 Chronic kidney disease, stage 3a: Secondary | ICD-10-CM | POA: Diagnosis not present

## 2023-06-12 DIAGNOSIS — M79671 Pain in right foot: Secondary | ICD-10-CM | POA: Diagnosis not present

## 2023-06-12 DIAGNOSIS — E1122 Type 2 diabetes mellitus with diabetic chronic kidney disease: Secondary | ICD-10-CM | POA: Diagnosis not present

## 2023-06-12 DIAGNOSIS — E782 Mixed hyperlipidemia: Secondary | ICD-10-CM | POA: Diagnosis not present

## 2023-06-12 DIAGNOSIS — G4730 Sleep apnea, unspecified: Secondary | ICD-10-CM | POA: Diagnosis not present

## 2023-06-12 DIAGNOSIS — I10 Essential (primary) hypertension: Secondary | ICD-10-CM | POA: Diagnosis not present

## 2023-06-12 DIAGNOSIS — M1990 Unspecified osteoarthritis, unspecified site: Secondary | ICD-10-CM | POA: Diagnosis not present

## 2023-06-12 DIAGNOSIS — I2510 Atherosclerotic heart disease of native coronary artery without angina pectoris: Secondary | ICD-10-CM | POA: Diagnosis not present

## 2023-06-12 DIAGNOSIS — I110 Hypertensive heart disease with heart failure: Secondary | ICD-10-CM | POA: Diagnosis not present

## 2023-07-02 DIAGNOSIS — E119 Type 2 diabetes mellitus without complications: Secondary | ICD-10-CM | POA: Diagnosis not present

## 2023-08-17 DIAGNOSIS — G4733 Obstructive sleep apnea (adult) (pediatric): Secondary | ICD-10-CM | POA: Diagnosis not present

## 2023-08-24 DIAGNOSIS — N1831 Chronic kidney disease, stage 3a: Secondary | ICD-10-CM | POA: Diagnosis not present

## 2023-08-27 DIAGNOSIS — I5022 Chronic systolic (congestive) heart failure: Secondary | ICD-10-CM | POA: Diagnosis not present

## 2023-08-27 DIAGNOSIS — E1122 Type 2 diabetes mellitus with diabetic chronic kidney disease: Secondary | ICD-10-CM | POA: Diagnosis not present

## 2023-08-27 DIAGNOSIS — I129 Hypertensive chronic kidney disease with stage 1 through stage 4 chronic kidney disease, or unspecified chronic kidney disease: Secondary | ICD-10-CM | POA: Diagnosis not present

## 2023-08-27 DIAGNOSIS — G4733 Obstructive sleep apnea (adult) (pediatric): Secondary | ICD-10-CM | POA: Diagnosis not present

## 2023-10-08 DIAGNOSIS — E782 Mixed hyperlipidemia: Secondary | ICD-10-CM | POA: Diagnosis not present

## 2023-10-09 LAB — LAB REPORT - SCANNED
A1c: 6
Albumin, Urine POC: <3
Albumin/Creatinine Ratio, Urine, POC: <4
Creatinine, POC: 84.2 mg/dL
EGFR: 49

## 2023-10-12 ENCOUNTER — Ambulatory Visit: Payer: Self-pay | Admitting: Cardiology

## 2023-10-12 DIAGNOSIS — I10 Essential (primary) hypertension: Secondary | ICD-10-CM | POA: Diagnosis not present

## 2023-10-12 DIAGNOSIS — M79671 Pain in right foot: Secondary | ICD-10-CM | POA: Diagnosis not present

## 2023-10-12 DIAGNOSIS — Z0000 Encounter for general adult medical examination without abnormal findings: Secondary | ICD-10-CM | POA: Diagnosis not present

## 2023-10-12 DIAGNOSIS — Z0001 Encounter for general adult medical examination with abnormal findings: Secondary | ICD-10-CM | POA: Diagnosis not present

## 2023-10-12 DIAGNOSIS — N1831 Chronic kidney disease, stage 3a: Secondary | ICD-10-CM | POA: Diagnosis not present

## 2023-10-12 DIAGNOSIS — I5022 Chronic systolic (congestive) heart failure: Secondary | ICD-10-CM | POA: Diagnosis not present

## 2023-10-12 DIAGNOSIS — E1122 Type 2 diabetes mellitus with diabetic chronic kidney disease: Secondary | ICD-10-CM | POA: Diagnosis not present

## 2023-10-12 DIAGNOSIS — I110 Hypertensive heart disease with heart failure: Secondary | ICD-10-CM | POA: Diagnosis not present

## 2023-10-12 DIAGNOSIS — M19042 Primary osteoarthritis, left hand: Secondary | ICD-10-CM | POA: Diagnosis not present

## 2023-10-12 DIAGNOSIS — M19032 Primary osteoarthritis, left wrist: Secondary | ICD-10-CM | POA: Diagnosis not present

## 2023-10-12 DIAGNOSIS — E782 Mixed hyperlipidemia: Secondary | ICD-10-CM | POA: Diagnosis not present

## 2023-10-12 DIAGNOSIS — M19041 Primary osteoarthritis, right hand: Secondary | ICD-10-CM | POA: Diagnosis not present

## 2023-10-17 DIAGNOSIS — G4733 Obstructive sleep apnea (adult) (pediatric): Secondary | ICD-10-CM | POA: Diagnosis not present

## 2023-11-08 ENCOUNTER — Telehealth (HOSPITAL_BASED_OUTPATIENT_CLINIC_OR_DEPARTMENT_OTHER): Payer: Self-pay | Admitting: *Deleted

## 2023-11-08 NOTE — Telephone Encounter (Signed)
 Tried contacting patient to scheduled TELEVISIT no answer left a detailed vm to call back and schedule

## 2023-11-08 NOTE — Telephone Encounter (Signed)
 Primary Cardiologist:James Hochrein, MD   Preoperative team, please contact this patient and set up a phone call appointment for further preoperative risk assessment. Please obtain consent and complete medication review. Thank you for your help.   I confirm that guidance regarding antiplatelet and oral anticoagulation therapy has been completed and, if necessary, noted below.  Regarding ASA therapy, we recommend continuation of ASA throughout the perioperative period.    I also confirmed the patient resides in the state of [place] . As per [facility] Medical Board telemedicine laws, the patient must reside in the state in which the provider is licensed.   Rosaline EMERSON Bane, NP-C  11/08/2023, 9:36 AM [address], [address] [place], [place] [address] Office [phone] Fax [phone]

## 2023-11-08 NOTE — Telephone Encounter (Signed)
   Pre-operative Risk Assessment    Patient Name: [name] [name]  DOB: 26-Oct-1950 MRN: [id number]   Date of last office visit: 01/18/2023 Date of next office visit: None  Request for Surgical Clearance    Procedure:  Right thumb [facility] arthroplasty with double tendon transfer and fiber lock suspension and repair as necessary.   Date of Surgery:  Clearance TBD                                 Surgeon:  Dr. Elsie Mussel Surgeon's Group or Practice Name:  [facility] Phone number:  [phone] Fax number:  [phone]   Type of Clearance Requested:   - Medical    Type of Anesthesia:  Anesthesia-Block with IV sedation   Additional requests/questions:    Signed, Edsel Grayce Sanders   11/08/2023, 9:14 AM

## 2023-11-09 NOTE — Telephone Encounter (Signed)
 2ND attempt to reach pt regarding surgical clearance and the need for an TELE appointment.  Left pt a detailed message to call back and get that scheduled.

## 2023-11-12 NOTE — Telephone Encounter (Signed)
 Tried contacting patient back to schedule TELEVISIT no answer left a detailed vm to call back and schedule

## 2023-11-12 NOTE — Telephone Encounter (Signed)
 Patient is returning phone call.

## 2023-11-12 NOTE — Telephone Encounter (Signed)
 S/w the pt and he tells me that he has been trying to get up with Dr. Camella to discuss about setting up the surgery. I suggested to the pt , that I will fax notes to Dr. Camella surgery scheduler to call him (the pt) to see Dr. Camella to discuss surgery. To see Dr. Camella 1st to set up a tentative date for surgery before we schedule tele preop appt.   Pt agrees with this plan of care.   I asked the pt that once he has a tentative surgery date to call us  back ASAP so that we may schedule the tele preop appt. Pt agrees to plan of care.

## 2023-11-12 NOTE — Telephone Encounter (Signed)
 3rd attempt to reach pt to schedule tele preop appt. Will update the requesting office. Will remove from preop call back until the pt calls back to schedule tele.

## 2023-11-12 NOTE — Telephone Encounter (Signed)
Pt was returning call and is requesting a call back. Please advise

## 2023-11-12 NOTE — Telephone Encounter (Signed)
 I will remove pt from preop call back until he calls back and is ready to schedule tele preop appt once he has a date for surgery.

## 2023-11-13 NOTE — Telephone Encounter (Signed)
 Pt would like a c/b tomorrow morning if possible to schedule Tele Visit. Please advise

## 2023-11-14 ENCOUNTER — Telehealth (HOSPITAL_BASED_OUTPATIENT_CLINIC_OR_DEPARTMENT_OTHER): Payer: Self-pay | Admitting: *Deleted

## 2023-11-14 NOTE — Telephone Encounter (Signed)
 Pt has been scheduled tele preop appt 11/23/23. Pt states surgeon is waiting on clearance before scheduling.   Med rec and consent are done.

## 2023-11-14 NOTE — Telephone Encounter (Signed)
 Pt has been scheduled tele preop appt 11/23/23. Pt states surgeon is waiting on clearance before scheduling.   Med rec and consent are done.      Patient Consent for Virtual Visit        [name] [name] has provided verbal consent on 11/14/2023 for a virtual visit (video or telephone).   CONSENT FOR VIRTUAL VISIT FOR:  [name] [name]  By participating in this virtual visit I agree to the following:  I hereby voluntarily request, consent and authorize [facility] and its employed or contracted physicians, physician assistants, nurse practitioners or other licensed health care professionals (the Practitioner), to provide me with telemedicine health care services (the "Services) as deemed necessary by the treating Practitioner. I acknowledge and consent to receive the Services by the Practitioner via telemedicine. I understand that the telemedicine visit will involve communicating with the Practitioner through live audiovisual communication technology and the disclosure of certain medical information by electronic transmission. I acknowledge that I have been given the opportunity to request an in-person assessment or other available alternative prior to the telemedicine visit and am voluntarily participating in the telemedicine visit.  I understand that I have the right to withhold or withdraw my consent to the use of telemedicine in the course of my care at any time, without affecting my right to future care or treatment, and that the Practitioner or I may terminate the telemedicine visit at any time. I understand that I have the right to inspect all information obtained and/or recorded in the course of the telemedicine visit and may receive copies of available information for a reasonable fee.  I understand that some of the potential risks of receiving the Services via telemedicine include:  Delay or interruption in medical evaluation due to technological equipment failure or  disruption; Information transmitted may not be sufficient (e.g. poor resolution of images) to allow for appropriate medical decision making by the Practitioner; and/or  In rare instances, security protocols could fail, causing a breach of personal health information.  Furthermore, I acknowledge that it is my responsibility to provide information about my medical history, conditions and care that is complete and accurate to the best of my ability. I acknowledge that Practitioner's advice, recommendations, and/or decision may be based on factors not within their control, such as incomplete or inaccurate data provided by me or distortions of diagnostic images or specimens that may result from electronic transmissions. I understand that the practice of medicine is not an exact science and that Practitioner makes no warranties or guarantees regarding treatment outcomes. I acknowledge that a copy of this consent can be made available to me via my patient portal [facility] MyChart), or I can request a printed copy by calling the office of [facility].    I understand that my insurance will be billed for this visit.   I have read or had this consent read to me. I understand the contents of this consent, which adequately explains the benefits and risks of the Services being provided via telemedicine.  I have been provided ample opportunity to ask questions regarding this consent and the Services and have had my questions answered to my satisfaction. I give my informed consent for the services to be provided through the use of telemedicine in my medical care

## 2023-11-23 ENCOUNTER — Ambulatory Visit: Attending: Cardiovascular Disease

## 2023-11-23 DIAGNOSIS — Z01810 Encounter for preprocedural cardiovascular examination: Secondary | ICD-10-CM

## 2023-11-23 NOTE — Progress Notes (Signed)
 Virtual Visit via Telephone Note   Because of [name] [name] co-morbid illnesses, he is at least at moderate risk for complications without adequate follow up.  This format is felt to be most appropriate for this patient at this time.  Due to technical limitations with video connection (technology), today's appointment will be conducted as an audio only telehealth visit, and [name] [name] verbally agreed to proceed in this manner.   All issues noted in this document were discussed and addressed.  No physical exam could be performed with this format.  Evaluation Performed:  Preoperative cardiovascular risk assessment _____________   Date:  11/23/2023   Patient ID:  [name] [name], DOB [birth year], MRN [id number] Patient Location:  Home Provider location:   Office  Primary Care Provider:  Shona Norleen PEDLAR, MD Primary Cardiologist:  Lynwood Schilling, MD  Chief Complaint / Patient Profile   73 y.o. y/o male with a h/o hypertension, coronary artery disease, hyperlipidemia who is pending Right thumb [facility] arthroplasty with double tendon transfer and fiber lock suspension and repair and presents today for telephonic preoperative cardiovascular risk assessment.  History of Present Illness    [name] [name] is a 73 y.o. male who presents via audio/video conferencing for a telehealth visit today.  Pt was last seen in cardiology clinic on 01/18/2023 by Dr. Schilling.  At that time [name] [name] was doing well .  The patient is now pending procedure as outlined above. Since his last visit, he continues to be stable from a cardiac standpoint.  Today he denies chest pain, shortness of breath, lower extremity edema, fatigue, palpitations, melena, hematuria, hemoptysis, diaphoresis, weakness, presyncope, syncope, orthopnea, and PND.   Past Medical History    Past Medical History:  Diagnosis Date   Coronary artery disease    S/P stent Summer 2008, January 2016 left main 30% stenosis, LAD mid 50%  stenosis, patent stent in the mid right coronary artery with ectasia proximal and distal to the stent. EF mildly reduced at 40% with global hypokinesis.   Diabetes mellitus (HCC)    A1c 6.1 (11-2008)   Erectile dysfunction    Hyperlipidemia    Hypertension    MI (mitral incompetence)    Myocardial infarction (HCC) 2016   Sleep apnea    URI (upper respiratory infection) 08/18/2011   Past Surgical History:  Procedure Laterality Date   COLONOSCOPY  2012   negative   COLONOSCOPY WITH PROPOFOL  N/A 06/09/2020   Procedure: COLONOSCOPY WITH PROPOFOL ;  Surgeon: Eartha Angelia Sieving, MD;  Location: [facility];  Service: Gastroenterology;  Laterality: N/A;  10:05   LEFT HEART CATH AND CORONARY ANGIOGRAPHY N/A 02/14/2017   Procedure: LEFT HEART CATH AND CORONARY ANGIOGRAPHY;  Surgeon: Burnard Debby LABOR, MD;  Location: [facility] INVASIVE CV LAB;  Service: Cardiovascular;  Laterality: N/A;   LEFT HEART CATH AND CORONARY ANGIOGRAPHY N/A 10/29/2020   Procedure: LEFT HEART CATH AND CORONARY ANGIOGRAPHY;  Surgeon: Claudene Victory ORN, MD;  Location: [facility] INVASIVE CV LAB;  Service: Cardiovascular;  Laterality: N/A;   RIGHT HEART CATH N/A 10/29/2020   Procedure: RIGHT HEART CATH;  Surgeon: Claudene Victory ORN, MD;  Location: [facility] INVASIVE CV LAB;  Service: Cardiovascular;  Laterality: N/A;   WRIST SURGERY     fractured wrist, Dr. Camella    Allergies  No Known Allergies  Home Medications    Prior to Admission medications   Medication Sig Start Date End Date Taking? Authorizing Provider  amLODipine  (NORVASC ) 5 MG tablet Take  1 tablet (5 mg total) by mouth daily. 11/01/20   Rosario Eland I, MD  aspirin  EC 81 MG tablet Take 81 mg by mouth daily.    [provider]  atorvastatin  (LIPITOR) 40 MG tablet TAKE 1 TABLET BY MOUTH ONCE DAILY Patient taking differently: Take 40 mg by mouth every evening. 01/01/17   Lavona Agent, MD  lisinopril  (ZESTRIL ) 5 MG tablet Take 5 mg by mouth daily.    [provider]  metFORMIN  (GLUCOPHAGE ) 500 MG tablet TAKE 1 TABLET BY MOUTH ONCE DAILY Patient taking differently: Take 500 mg by mouth every evening. 04/16/17   Lavona Agent, MD  metoprolol  succinate (TOPROL -XL) 50 MG 24 hr tablet Take 1 tablet (50 mg total) by mouth daily. Take with or immediately following a meal. 06/08/21   Lavona Agent, MD  nitroGLYCERIN  (NITROSTAT ) 0.4 MG SL tablet Place 1 tablet (0.4 mg total) under the tongue every 5 (five) minutes as needed. 04/20/20   Goodrich, Callie E, PA-C  OZEMPIC, 0.25 OR 0.5 MG/DOSE, 2 MG/3ML SOPN AS DIRECTED PT STATES 1.25 MG WEEKLY 11/05/23   [provider]  potassium chloride  (KLOR-CON ) 10 MEQ tablet Take 10 mEq by mouth daily.    [provider]    Physical Exam    Vital Signs:  [name] [name] does not have vital signs available for review today.  Given telephonic nature of communication, physical exam is limited. AAOx3. NAD. Normal affect.  Speech and respirations are unlabored.  Accessory Clinical Findings    None  Assessment & Plan    1.  Preoperative Cardiovascular Risk Assessment: Right thumb [facility] arthroplasty with double tendon transfer and fiber lock suspension and repair as necessary.    Date of Surgery:  Clearance TBD                                  Surgeon:  Dr. Elsie Mussel Surgeon's Group or Practice Name:  [facility] Phone number:  [phone] Fax number:  [phone]    Primary Cardiologist: Agent Lavona, MD  Chart reviewed as part of pre-operative protocol coverage. Given past medical history and time since last visit, based on ACC/AHA guidelines, [name] [name] would be at acceptable risk for the planned procedure without further cardiovascular testing.   His RCRI is low risk, 0.9% risk of major cardiac event.  He is able to complete greater than 4 METS of physical activity.  Patient was advised that if he develops new symptoms prior to surgery to contact our office to arrange a follow-up  appointment.  He verbalized understanding.  Regarding ASA therapy, we recommend continuation of ASA throughout the perioperative period.    I will route this recommendation to the requesting party via Epic fax function and remove from pre-op pool.       Time:   Today, I have spent  5 minutes with the patient with telehealth technology discussing medical history, symptoms, and management plan.  I spent 10 minutes reviewing patient's past cardiac history and cardiac medications.    [name] CHRISTELLA Beauvais, NP  11/23/2023, 7:00 AM
